# Patient Record
Sex: Female | Born: 1950 | Race: Black or African American | Hispanic: No | Marital: Married | Smoking: Former smoker
Health system: Southern US, Community
[De-identification: ages and names within clinical notes are randomized; demographics above are authoritative.]

## PROBLEM LIST (undated history)

## (undated) DIAGNOSIS — I10 Essential (primary) hypertension: Secondary | ICD-10-CM

## (undated) DIAGNOSIS — M1990 Unspecified osteoarthritis, unspecified site: Secondary | ICD-10-CM

## (undated) DIAGNOSIS — H409 Unspecified glaucoma: Secondary | ICD-10-CM

## (undated) DIAGNOSIS — K635 Polyp of colon: Secondary | ICD-10-CM

## (undated) HISTORY — DX: Polyp of colon: K63.5

## (undated) HISTORY — PX: KNEE ARTHROSCOPY: SUR90

## (undated) HISTORY — PX: ABDOMINAL HYSTERECTOMY: SHX81

---

## 1998-08-13 ENCOUNTER — Encounter: Payer: Self-pay | Admitting: Family Medicine

## 1998-08-13 ENCOUNTER — Ambulatory Visit (HOSPITAL_COMMUNITY): Admission: RE | Admit: 1998-08-13 | Discharge: 1998-08-13 | Payer: Self-pay | Admitting: Family Medicine

## 2000-06-15 ENCOUNTER — Other Ambulatory Visit: Admission: RE | Admit: 2000-06-15 | Discharge: 2000-06-15 | Payer: Self-pay | Admitting: Family Medicine

## 2002-10-02 ENCOUNTER — Encounter: Admission: RE | Admit: 2002-10-02 | Discharge: 2002-10-02 | Payer: Self-pay | Admitting: Family Medicine

## 2002-10-02 ENCOUNTER — Encounter: Payer: Self-pay | Admitting: Family Medicine

## 2002-10-17 ENCOUNTER — Encounter: Payer: Self-pay | Admitting: Orthopedic Surgery

## 2002-10-17 ENCOUNTER — Ambulatory Visit (HOSPITAL_COMMUNITY): Admission: RE | Admit: 2002-10-17 | Discharge: 2002-10-17 | Payer: Self-pay | Admitting: Orthopedic Surgery

## 2002-11-01 ENCOUNTER — Encounter: Payer: Self-pay | Admitting: Orthopedic Surgery

## 2002-11-06 ENCOUNTER — Ambulatory Visit (HOSPITAL_COMMUNITY): Admission: RE | Admit: 2002-11-06 | Discharge: 2002-11-06 | Payer: Self-pay | Admitting: Orthopedic Surgery

## 2002-11-24 ENCOUNTER — Encounter (HOSPITAL_COMMUNITY): Admission: RE | Admit: 2002-11-24 | Discharge: 2002-12-24 | Payer: Self-pay | Admitting: Orthopedic Surgery

## 2012-04-06 ENCOUNTER — Other Ambulatory Visit (HOSPITAL_COMMUNITY): Payer: Self-pay | Admitting: Family Medicine

## 2012-04-06 DIAGNOSIS — Z139 Encounter for screening, unspecified: Secondary | ICD-10-CM

## 2012-04-18 ENCOUNTER — Ambulatory Visit (HOSPITAL_COMMUNITY)
Admission: RE | Admit: 2012-04-18 | Discharge: 2012-04-18 | Disposition: A | Discharge status: Home / Self Care | Payer: BC Managed Care – PPO | Source: Ambulatory Visit | Attending: Family Medicine | Admitting: Family Medicine

## 2012-04-18 DIAGNOSIS — Z139 Encounter for screening, unspecified: Secondary | ICD-10-CM

## 2012-04-18 DIAGNOSIS — Z1231 Encounter for screening mammogram for malignant neoplasm of breast: Secondary | ICD-10-CM | POA: Insufficient documentation

## 2012-05-10 ENCOUNTER — Ambulatory Visit (HOSPITAL_COMMUNITY)
Admission: RE | Admit: 2012-05-10 | Discharge: 2012-05-10 | Disposition: A | Discharge status: Home / Self Care | Payer: BC Managed Care – PPO | Source: Ambulatory Visit | Attending: Family Medicine | Admitting: Family Medicine

## 2012-05-10 ENCOUNTER — Other Ambulatory Visit (HOSPITAL_COMMUNITY): Payer: Self-pay | Admitting: Family Medicine

## 2012-05-10 DIAGNOSIS — M1250 Traumatic arthropathy, unspecified site: Secondary | ICD-10-CM | POA: Insufficient documentation

## 2012-07-18 ENCOUNTER — Telehealth: Payer: Self-pay

## 2012-07-18 ENCOUNTER — Other Ambulatory Visit: Payer: Self-pay

## 2012-07-18 DIAGNOSIS — Z1211 Encounter for screening for malignant neoplasm of colon: Secondary | ICD-10-CM

## 2012-07-18 NOTE — Telephone Encounter (Signed)
Gastroenterology Pre-Procedure Form    Request Date: 07/18/2012    ,  Requesting Physician: Dr. Parke Simmers     PATIENT INFORMATION:  Christine Poole is a 62 y.o., female (DOB=03-Jul-1950).  PROCEDURE: Procedure(s) requested: colonoscopy Procedure Reason: screening for colon cancer  PATIENT REVIEW QUESTIONS: The patient reports the following:   1. Diabetes Melitis: no 2. Joint replacements in the past 12 months: no 3. Major health problems in the past 3 months: no 4. Has an artificial valve or MVP:no 5. Has been advised in past to take antibiotics in advance of a procedure like teeth cleaning: no}    MEDICATIONS & ALLERGIES:    Patient reports the following regarding taking any blood thinners:   Plavix? no Aspirin?no Coumadin?  no  Patient confirms/reports the following medications:  Current Outpatient Prescriptions  Medication Sig Dispense Refill  . aspirin 81 MG tablet Take 81 mg by mouth daily.      Marland Kitchen diltiazem (CARDIZEM) 90 MG tablet Take 90 mg by mouth 2 (two) times daily.      Marland Kitchen lisinopril-hydrochlorothiazide (PRINZIDE,ZESTORETIC) 20-25 MG per tablet Take 1 tablet by mouth daily.      . Multiple Vitamin (MULTIVITAMIN) tablet Take 1 tablet by mouth daily.      . naproxen (NAPROSYN) 500 MG tablet Take 500 mg by mouth 2 (two) times daily with a meal.       No current facility-administered medications for this visit.    Patient confirms/reports the following allergies:  No Known Allergies  Patient is appropriate to schedule for requested procedure(s): yes  AUTHORIZATION INFORMATION Primary Insurance:   ID #:   Group #:  Pre-Cert / Auth required:  Pre-Cert / Auth #:   Secondary Insurance:  ID #:  Group #:  Pre-Cert / Auth required: Pre-Cert / Auth #:   No orders of the defined types were placed in this encounter.    SCHEDULE INFORMATION: Procedure has been scheduled as follows:  Date: 08/08/2012     Time: 7:30 AM  Location: [facility] Short Stay  This  Gastroenterology Pre-Precedure Form is being routed to the following provider(s) for review: R. Roetta Sessions, MD

## 2012-07-18 NOTE — Telephone Encounter (Signed)
Appropriate.

## 2012-07-19 MED ORDER — PEG-KCL-NACL-NASULF-NA ASC-C 100 G PO SOLR: 1.0000 | ORAL | Status: DC

## 2012-07-19 NOTE — Telephone Encounter (Signed)
Rx was sent to [facility]. Instructions mailed to pt.

## 2012-07-19 NOTE — Telephone Encounter (Signed)
LMOM at [phone] that her appt was changed from 9:30 AM appt to 7:30 AM on 08/08/2012 and she will need to be at the hospital at 6:30 AM. Mailed the info to her also.

## 2012-07-29 ENCOUNTER — Encounter (HOSPITAL_COMMUNITY): Payer: Self-pay | Admitting: Pharmacy Technician

## 2012-08-05 MED ORDER — SODIUM CHLORIDE 0.45 % IV SOLN: INTRAVENOUS | Status: DC

## 2012-08-08 ENCOUNTER — Ambulatory Visit (HOSPITAL_COMMUNITY)
Admission: RE | Admit: 2012-08-08 | Discharge: 2012-08-08 | Disposition: A | Discharge status: Home / Self Care | Payer: BC Managed Care – PPO | Source: Ambulatory Visit | Attending: Internal Medicine | Admitting: Internal Medicine

## 2012-08-08 ENCOUNTER — Encounter (HOSPITAL_COMMUNITY): Payer: Self-pay

## 2012-08-08 ENCOUNTER — Encounter (HOSPITAL_COMMUNITY)
Admission: RE | Disposition: A | Discharge status: Home / Self Care | Payer: Self-pay | Source: Ambulatory Visit | Attending: Internal Medicine

## 2012-08-08 DIAGNOSIS — K648 Other hemorrhoids: Secondary | ICD-10-CM

## 2012-08-08 DIAGNOSIS — I10 Essential (primary) hypertension: Secondary | ICD-10-CM | POA: Insufficient documentation

## 2012-08-08 DIAGNOSIS — K620 Anal polyp: Secondary | ICD-10-CM

## 2012-08-08 DIAGNOSIS — Z1211 Encounter for screening for malignant neoplasm of colon: Secondary | ICD-10-CM | POA: Insufficient documentation

## 2012-08-08 DIAGNOSIS — D128 Benign neoplasm of rectum: Secondary | ICD-10-CM | POA: Insufficient documentation

## 2012-08-08 DIAGNOSIS — K621 Rectal polyp: Secondary | ICD-10-CM

## 2012-08-08 DIAGNOSIS — Z538 Procedure and treatment not carried out for other reasons: Secondary | ICD-10-CM | POA: Insufficient documentation

## 2012-08-08 HISTORY — DX: Essential (primary) hypertension: I10

## 2012-08-08 HISTORY — DX: Unspecified osteoarthritis, unspecified site: M19.90

## 2012-08-08 HISTORY — DX: Unspecified glaucoma: H40.9

## 2012-08-08 HISTORY — PX: COLONOSCOPY: SHX5424

## 2012-08-08 SURGERY — COLONOSCOPY
Anesthesia: Moderate Sedation

## 2012-08-08 MED ORDER — STERILE WATER FOR IRRIGATION IR SOLN
Status: DC | PRN
  Administered 2012-08-08: 07:00:00

## 2012-08-08 MED ORDER — MIDAZOLAM HCL 5 MG/5ML IJ SOLN
INTRAMUSCULAR | Status: DC | PRN
  Administered 2012-08-08: 2 mg via INTRAVENOUS
  Administered 2012-08-08: 1 mg via INTRAVENOUS

## 2012-08-08 MED ORDER — MEPERIDINE HCL 100 MG/ML IJ SOLN
INTRAMUSCULAR | Status: DC | PRN
  Administered 2012-08-08: 50 mg via INTRAVENOUS
  Administered 2012-08-08: 25 mg via INTRAVENOUS

## 2012-08-08 MED ORDER — SODIUM CHLORIDE 0.9 % IV SOLN
INTRAVENOUS | Status: DC
  Administered 2012-08-08: 07:00:00 via INTRAVENOUS

## 2012-08-08 MED ORDER — MEPERIDINE HCL 100 MG/ML IJ SOLN
INTRAMUSCULAR | Status: AC
  Filled 2012-08-08: qty 1

## 2012-08-08 MED ORDER — ONDANSETRON HCL 4 MG/2ML IJ SOLN
INTRAMUSCULAR | Status: DC | PRN
  Administered 2012-08-08: 4 mg via INTRAVENOUS

## 2012-08-08 MED ORDER — ONDANSETRON HCL 4 MG/2ML IJ SOLN
INTRAMUSCULAR | Status: AC
  Filled 2012-08-08: qty 2

## 2012-08-08 MED ORDER — MIDAZOLAM HCL 5 MG/5ML IJ SOLN
INTRAMUSCULAR | Status: AC
  Filled 2012-08-08: qty 10

## 2012-08-08 NOTE — Op Note (Signed)
[facility] [address] [place], [address]   COLONOSCOPY PROCEDURE REPORT  PATIENT: Christine Poole, Christine Poole  MR#:         [id number] BIRTHDATE: August 30, 1950 , 61  yrs. old GENDER: Female ENDOSCOPIST: R.  Roetta Sessions, MD FACP FACG REFERRED BY:  Renaye Rakers, M.D. PROCEDURE DATE:  08/08/2012 PROCEDURE:     Incomplete ileocolonoscopy with biopsy  INDICATIONS: First-ever average risk colorectal cancer screening examination  INFORMED CONSENT:  The risks, benefits, alternatives and imponderables including but not limited to bleeding, perforation as well as the possibility of a missed lesion have been reviewed.  The potential for biopsy, lesion removal, etc. have also been discussed.  Questions have been answered.  All parties agreeable. Please see the history and physical in the medical record for more information.  MEDICATIONS: Versed 4 mg IV and Demerol 75 mg IV in divided doses. Zofran 4 mg IV  DESCRIPTION OF PROCEDURE:  After a digital rectal exam was performed, the EC-3890Li ([id number])  colonoscope was advanced from the anus through the rectum and colon to the area of the cecum, ileocecal valve and appendiceal orifice.  The cecum was deeply intubated.  These structures were well-seen and photographed for the record.  From the level of the cecum and ileocecal valve, the scope was slowly and cautiously withdrawn.  The mucosal surfaces were carefully surveyed utilizing scope tip deflection to facilitate fold flattening as needed.  The scope was pulled down into the rectum where a thorough examination including retroflexion was performed.    FINDINGS:  Inadequate preparation . Much stool/vegetable fiber debris in the lumen of the colon precluded complete examination ( patient states she ate "greens" and corn yesterday which would have been in conflict with her written and verbal prep instructions). The portions of the colon that were seen appeared normal. The  cecum was not seen at all. The distal 10 cm of terminal ileum was intubated. Again, the portions of the terminal ileal mucosa seen appeared normal.  There were (2) diminutive distal rectal polyps and internal hemorrhoids. THERAPEUTIC / DIAGNOSTIC MANEUVERS PERFORMED:  The rectal polyps were cold biopsied/removed  COMPLICATIONS: none  CECAL WITHDRAWAL TIME:  10 minutes  IMPRESSION: Inadequate ileocolonoscopy secondary to poor prep. Rectal polyps and internal hemorrhoids. Status post removal of rectal polyps as described above  RECOMMENDATIONS:  Followup on pathology. The patient will need an early interval repeat screening colonoscopy, hopefully, in the setting of a better preparation.   _______________________________ eSigned:  R. Roetta Sessions, MD FACP [facility] 08/08/2012 8:26 AM   CC:

## 2012-08-08 NOTE — H&P (Signed)
Primary Care Physician:  Geraldo Pitter, MD Primary Gastroenterologist:  Dr. Jena Gauss  Pre-Procedure History & Physical: HPI:  Christine Poole is a 62 y.o. female is here for a screening colonoscopy. No bowel symptoms. No prior colonoscopy. No family history colon polyps or colon cancer.  Past Medical History  Diagnosis Date  . Glaucoma   . Hypertension   . Arthritis     Past Surgical History  Procedure Laterality Date  . Abdominal hysterectomy    . Knee arthroscopy Left     Prior to Admission medications   Medication Sig Start Date End Date Taking? Authorizing Provider  aspirin 81 MG tablet Take 81 mg by mouth daily.   Yes Historical Provider, MD  diltiazem (CARDIZEM) 90 MG tablet Take 90 mg by mouth 2 (two) times daily.   Yes Historical Provider, MD  lisinopril-hydrochlorothiazide (PRINZIDE,ZESTORETIC) 20-25 MG per tablet Take 1 tablet by mouth daily.   Yes Historical Provider, MD  Multiple Vitamin (MULTIVITAMIN) tablet Take 1 tablet by mouth daily.   Yes Historical Provider, MD  naproxen (NAPROSYN) 500 MG tablet Take 500 mg by mouth 2 (two) times daily with a meal.   Yes Historical Provider, MD  peg 3350 powder (MOVIPREP) 100 G SOLR Take 1 kit (100 g total) by mouth as directed. 07/19/12  Yes Corbin Ade, MD    Allergies as of 07/18/2012  . (No Known Allergies)    History reviewed. No pertinent family history.  History   Social History  . Marital Status: Married    Spouse Name: N/A    Number of Children: N/A  . Years of Education: N/A   Occupational History  . Not on file.   Social History Main Topics  . Smoking status: Current Every Day Smoker -- 0.25 packs/day for 40 years    Types: Cigarettes  . Smokeless tobacco: Not on file  . Alcohol Use: No  . Drug Use: No  . Sexually Active: Yes    Birth Control/ Protection: Surgical   Other Topics Concern  . Not on file   Social History Narrative  . No narrative on file    Review of Systems: See HPI,  otherwise negative ROS  Physical Exam: BP 170/100  Pulse 89  Temp(Src) 98.1 F (36.7 C) (Oral)  Resp 15  Ht 5\' 6"  (1.676 m)  Wt 175 lb (79.379 kg)  BMI 28.26 kg/m2  SpO2 97% General:   Alert,  Well-developed, well-nourished, pleasant and cooperative in NAD Head:  Normocephalic and atraumatic. Eyes:  Sclera clear, no icterus.   Conjunctiva pink. Ears:  Normal auditory acuity. Nose:  No deformity, discharge,  or lesions. Mouth:  No deformity or lesions, dentition normal. Neck:  Supple; no masses or thyromegaly. Lungs:  Clear throughout to auscultation.   No wheezes, crackles, or rhonchi. No acute distress. Heart:  Regular rate and rhythm; no murmurs, clicks, rubs,  or gallops. Abdomen:  Nondistended. Positive bowel sounds. Soft and nontender without appreciable mass or organomegaly Msk:  Symmetrical without gross deformities. Normal posture. Pulses:  Normal pulses noted. Extremities:  Without clubbing or edema. Neurologic:  Alert and  oriented x4;  grossly normal neurologically. Skin:  Intact without significant lesions or rashes. Cervical Nodes:  No significant cervical adenopathy. Psych:  Alert and cooperative. Normal mood and affect.  Impression/Plan: [employer] is now here to undergo a screening colonoscopy.  First ever average risk screening examination.  Risks, benefits, limitations, imponderables and alternatives regarding colonoscopy have been reviewed with the patient.  Questions have been answered. All parties agreeable.

## 2012-08-10 ENCOUNTER — Encounter (HOSPITAL_COMMUNITY): Payer: Self-pay | Admitting: Internal Medicine

## 2012-08-11 ENCOUNTER — Encounter: Payer: Self-pay | Admitting: Internal Medicine

## 2012-11-24 ENCOUNTER — Other Ambulatory Visit (HOSPITAL_COMMUNITY): Payer: Self-pay | Admitting: Family Medicine

## 2012-11-24 DIAGNOSIS — E041 Nontoxic single thyroid nodule: Secondary | ICD-10-CM

## 2012-11-29 ENCOUNTER — Ambulatory Visit (HOSPITAL_COMMUNITY)
Admission: RE | Admit: 2012-11-29 | Discharge: 2012-11-29 | Disposition: A | Discharge status: Home / Self Care | Payer: BC Managed Care – PPO | Source: Ambulatory Visit | Attending: Family Medicine | Admitting: Family Medicine

## 2012-11-29 DIAGNOSIS — E041 Nontoxic single thyroid nodule: Secondary | ICD-10-CM | POA: Insufficient documentation

## 2013-03-20 ENCOUNTER — Other Ambulatory Visit (HOSPITAL_COMMUNITY): Payer: Self-pay | Admitting: Family Medicine

## 2013-03-20 DIAGNOSIS — R221 Localized swelling, mass and lump, neck: Secondary | ICD-10-CM

## 2013-03-27 ENCOUNTER — Other Ambulatory Visit (HOSPITAL_COMMUNITY): Payer: BC Managed Care – PPO

## 2013-03-27 ENCOUNTER — Ambulatory Visit (HOSPITAL_COMMUNITY): Payer: BC Managed Care – PPO | Attending: Family Medicine

## 2013-04-17 ENCOUNTER — Other Ambulatory Visit (HOSPITAL_COMMUNITY): Payer: Self-pay | Admitting: Family Medicine

## 2013-04-17 DIAGNOSIS — Z139 Encounter for screening, unspecified: Secondary | ICD-10-CM

## 2013-04-24 ENCOUNTER — Ambulatory Visit (HOSPITAL_COMMUNITY)
Admission: RE | Admit: 2013-04-24 | Discharge: 2013-04-24 | Disposition: A | Discharge status: Home / Self Care | Payer: BC Managed Care – PPO | Source: Ambulatory Visit | Attending: Family Medicine | Admitting: Family Medicine

## 2013-04-24 DIAGNOSIS — Z139 Encounter for screening, unspecified: Secondary | ICD-10-CM

## 2013-04-24 DIAGNOSIS — Z1231 Encounter for screening mammogram for malignant neoplasm of breast: Secondary | ICD-10-CM | POA: Insufficient documentation

## 2014-02-26 ENCOUNTER — Encounter: Payer: Self-pay | Admitting: Internal Medicine

## 2014-02-28 ENCOUNTER — Telehealth: Payer: Self-pay

## 2014-02-28 NOTE — Telephone Encounter (Signed)
Lorne Skeens from [facility] called and left voicemail. They are wanting to know when pt is scheduled to have her next tcs. I have tried to call Judeen Hammans back several times and keep being put on hold and no one answers. I have faxed a letter to them, letting them know that pt has upcoming appt on 03/28/14 with RMR to set up procedure.

## 2014-03-22 ENCOUNTER — Encounter: Payer: Self-pay | Admitting: *Deleted

## 2014-03-28 ENCOUNTER — Other Ambulatory Visit: Payer: Self-pay

## 2014-03-28 ENCOUNTER — Encounter: Payer: Self-pay | Admitting: Internal Medicine

## 2014-03-28 ENCOUNTER — Ambulatory Visit (INDEPENDENT_AMBULATORY_CARE_PROVIDER_SITE_OTHER): Payer: BC Managed Care – PPO | Admitting: Internal Medicine

## 2014-03-28 VITALS — BP 119/83 | HR 97 | Temp 97.1°F | Ht 66.0 in | Wt 185.0 lb

## 2014-03-28 DIAGNOSIS — Z1212 Encounter for screening for malignant neoplasm of rectum: Secondary | ICD-10-CM

## 2014-03-28 DIAGNOSIS — Z1211 Encounter for screening for malignant neoplasm of colon: Secondary | ICD-10-CM

## 2014-03-28 MED ORDER — PEG 3350-KCL-NA BICARB-NACL 420 G PO SOLR: 4000.0000 mL | Freq: Once | ORAL | Status: DC

## 2014-03-28 NOTE — Patient Instructions (Signed)
Schedule screening colonoscopy-preference for a Monday.  Split Movie prep

## 2014-03-28 NOTE — Progress Notes (Signed)
  Primary Care Physician:  BLAND,VEITA J, MD Primary Gastroenterologist:  Dr. Jaila Schellhorn  Pre-Procedure History & Physical: HPI:  Christine Poole is a 63 y.o. female here for for followup and discuss last years in adequate colonoscopy. Patient underwent her first ever average risk screening colonoscopy last year. Unfortunately, she "greens and colon" which produced a totally inadequate preparation. She did have some hyperplastic in the rectum which were removed. She returns now to discuss repeat colonoscopy. Has had no bowel symptoms. No family history of colon cancer. No significant interim health problems.  Past Medical History  Diagnosis Date  . Glaucoma   . Hypertension   . Arthritis   . Hyperplastic polyp of large intestine     Past Surgical History  Procedure Laterality Date  . Abdominal hysterectomy    . Knee arthroscopy Left   . Colonoscopy N/A 08/08/2012    RMR:Rectal polyps and internal hemorrhoids.Status post removal rectal polyps    Prior to Admission medications   Medication Sig Start Date End Date Taking? Authorizing Provider  aspirin 81 MG tablet Take 81 mg by mouth daily.   Yes Historical Provider, MD  diltiazem (CARDIZEM) 90 MG tablet Take 90 mg by mouth 2 (two) times daily.   Yes Historical Provider, MD  lisinopril-hydrochlorothiazide (PRINZIDE,ZESTORETIC) 20-25 MG per tablet Take 1 tablet by mouth daily.   Yes Historical Provider, MD  Multiple Vitamin (MULTIVITAMIN) tablet Take 1 tablet by mouth daily.   Yes Historical Provider, MD  naproxen (NAPROSYN) 500 MG tablet Take 500 mg by mouth 2 (two) times daily with a meal.   Yes Historical Provider, MD  peg 3350 powder (MOVIPREP) 100 G SOLR Take 1 kit (100 g total) by mouth as directed. 07/19/12   Hetal Proano M Minerva Bluett, MD    Allergies as of 03/28/2014  . (No Known Allergies)    No family history on file.  History   Social History  . Marital Status: Married    Spouse Name: N/A    Number of Children: N/A  . Years  of Education: N/A   Occupational History  . Not on file.   Social History Main Topics  . Smoking status: Current Every Day Smoker -- 0.25 packs/day for 40 years    Types: Cigarettes  . Smokeless tobacco: Not on file  . Alcohol Use: No  . Drug Use: No  . Sexual Activity: Yes    Birth Control/ Protection: Surgical   Other Topics Concern  . Not on file   Social History Narrative    Review of Systems: See HPI, otherwise negative ROS  Physical Exam: BP 119/83 mmHg  Pulse 97  Temp(Src) 97.1 F (36.2 C) (Oral)  Ht 5' 6" (1.676 m)  Wt 185 lb (83.915 kg)  BMI 29.87 kg/m2 General:   Alert,  Well-developed, well-nourished, pleasant and cooperative in NAD Skin:  Intact without significant lesions or rashes. Eyes:  Sclera clear, no icterus.   Conjunctiva pink. Ears:  Normal auditory acuity. Nose:  No deformity, discharge,  or lesions. Mouth:  No deformity or lesions. Neck:  Supple; no masses or thyromegaly. No significant cervical adenopathy. Lungs:  Clear throughout to auscultation.   No wheezes, crackles, or rhonchi. No acute distress. Heart:  Regular rate and rhythm; no murmurs, clicks, rubs,  or gallops. Abdomen: Non-distended, normal bowel sounds.  Soft and nontender without appreciable mass or hepatosplenomegaly.  Pulses:  Normal pulses noted. Extremities:  Without clubbing or edema.  Impression:  Very pleasant 63-year-old lady presents for consideration   of a screening colonoscopy. Discussed the critical importance of an adequate preparation. Last years exam was inadequate.   Recommendations:  Notice: This dictation was prepared with Dragon dictation along with smaller phrase technology. Any transcriptional errors that result from this process are unintentional and may not be corrected upon review. 

## 2014-04-09 ENCOUNTER — Telehealth: Payer: Self-pay | Admitting: Internal Medicine

## 2014-04-09 NOTE — Telephone Encounter (Signed)
Patient called this morning saying that she has a tcs set up for 12/7 with RMR and wanted to make sure we have called in her prep to [facility] in [place] and she will not forget to pick it up this time.

## 2014-04-10 NOTE — Telephone Encounter (Signed)
Prep is at the drug store and patient is aware

## 2014-04-16 ENCOUNTER — Ambulatory Visit (HOSPITAL_COMMUNITY)
Admission: RE | Admit: 2014-04-16 | Discharge: 2014-04-16 | Disposition: A | Discharge status: Home / Self Care | Payer: BC Managed Care – PPO | Source: Ambulatory Visit | Attending: Internal Medicine | Admitting: Internal Medicine

## 2014-04-16 ENCOUNTER — Encounter (HOSPITAL_COMMUNITY): Payer: Self-pay

## 2014-04-16 ENCOUNTER — Encounter (HOSPITAL_COMMUNITY)
Admission: RE | Disposition: A | Discharge status: Home / Self Care | Payer: Self-pay | Source: Ambulatory Visit | Attending: Internal Medicine

## 2014-04-16 DIAGNOSIS — Z1211 Encounter for screening for malignant neoplasm of colon: Secondary | ICD-10-CM | POA: Insufficient documentation

## 2014-04-16 DIAGNOSIS — M1990 Unspecified osteoarthritis, unspecified site: Secondary | ICD-10-CM | POA: Insufficient documentation

## 2014-04-16 DIAGNOSIS — I10 Essential (primary) hypertension: Secondary | ICD-10-CM | POA: Diagnosis not present

## 2014-04-16 DIAGNOSIS — Z7982 Long term (current) use of aspirin: Secondary | ICD-10-CM | POA: Insufficient documentation

## 2014-04-16 DIAGNOSIS — K648 Other hemorrhoids: Secondary | ICD-10-CM | POA: Insufficient documentation

## 2014-04-16 DIAGNOSIS — F17210 Nicotine dependence, cigarettes, uncomplicated: Secondary | ICD-10-CM | POA: Diagnosis not present

## 2014-04-16 DIAGNOSIS — Z791 Long term (current) use of non-steroidal anti-inflammatories (NSAID): Secondary | ICD-10-CM | POA: Insufficient documentation

## 2014-04-16 DIAGNOSIS — Z9889 Other specified postprocedural states: Secondary | ICD-10-CM | POA: Insufficient documentation

## 2014-04-16 HISTORY — PX: COLONOSCOPY: SHX5424

## 2014-04-16 SURGERY — COLONOSCOPY
Anesthesia: Moderate Sedation

## 2014-04-16 MED ORDER — STERILE WATER FOR IRRIGATION IR SOLN
Status: DC | PRN
  Administered 2014-04-16: 13:00:00

## 2014-04-16 MED ORDER — ONDANSETRON HCL 4 MG/2ML IJ SOLN
INTRAMUSCULAR | Status: DC | PRN
  Administered 2014-04-16: 4 mg via INTRAVENOUS

## 2014-04-16 MED ORDER — SODIUM CHLORIDE 0.9 % IV SOLN
INTRAVENOUS | Status: DC
  Administered 2014-04-16: 12:00:00 via INTRAVENOUS

## 2014-04-16 MED ORDER — MIDAZOLAM HCL 5 MG/5ML IJ SOLN
INTRAMUSCULAR | Status: AC
  Filled 2014-04-16: qty 10

## 2014-04-16 MED ORDER — ONDANSETRON HCL 4 MG/2ML IJ SOLN
INTRAMUSCULAR | Status: AC
  Filled 2014-04-16: qty 2

## 2014-04-16 MED ORDER — MEPERIDINE HCL 100 MG/ML IJ SOLN
INTRAMUSCULAR | Status: DC | PRN
  Administered 2014-04-16: 25 mg via INTRAVENOUS
  Administered 2014-04-16: 50 mg via INTRAVENOUS
  Administered 2014-04-16: 25 mg via INTRAVENOUS

## 2014-04-16 MED ORDER — MIDAZOLAM HCL 5 MG/5ML IJ SOLN
INTRAMUSCULAR | Status: DC | PRN
  Administered 2014-04-16: 2 mg via INTRAVENOUS
  Administered 2014-04-16: 1 mg via INTRAVENOUS
  Administered 2014-04-16: 2 mg via INTRAVENOUS
  Administered 2014-04-16: 1 mg via INTRAVENOUS

## 2014-04-16 MED ORDER — MEPERIDINE HCL 100 MG/ML IJ SOLN
INTRAMUSCULAR | Status: AC
  Filled 2014-04-16: qty 2

## 2014-04-16 NOTE — H&P (View-Only) (Signed)
Primary Care Physician:  Elyn Peers, MD Primary Gastroenterologist:  Dr. Gala Romney  Pre-Procedure History & Physical: HPI:  Christine Poole is a 63 y.o. female here for for followup and discuss last years in adequate colonoscopy. Patient underwent her first ever average risk screening colonoscopy last year. Unfortunately, she "greens and colon" which produced a totally inadequate preparation. She did have some hyperplastic in the rectum which were removed. She returns now to discuss repeat colonoscopy. Has had no bowel symptoms. No family history of colon cancer. No significant interim health problems.  Past Medical History  Diagnosis Date  . Glaucoma   . Hypertension   . Arthritis   . Hyperplastic polyp of large intestine     Past Surgical History  Procedure Laterality Date  . Abdominal hysterectomy    . Knee arthroscopy Left   . Colonoscopy N/A 08/08/2012    NTZ:GYFVCB polyps and internal hemorrhoids.Status post removal rectal polyps    Prior to Admission medications   Medication Sig Start Date End Date Taking? Authorizing Provider  aspirin 81 MG tablet Take 81 mg by mouth daily.   Yes Historical Provider, MD  diltiazem (CARDIZEM) 90 MG tablet Take 90 mg by mouth 2 (two) times daily.   Yes Historical Provider, MD  lisinopril-hydrochlorothiazide (PRINZIDE,ZESTORETIC) 20-25 MG per tablet Take 1 tablet by mouth daily.   Yes Historical Provider, MD  Multiple Vitamin (MULTIVITAMIN) tablet Take 1 tablet by mouth daily.   Yes Historical Provider, MD  naproxen (NAPROSYN) 500 MG tablet Take 500 mg by mouth 2 (two) times daily with a meal.   Yes Historical Provider, MD  peg 3350 powder (MOVIPREP) 100 G SOLR Take 1 kit (100 g total) by mouth as directed. 07/19/12   Daneil Dolin, MD    Allergies as of 03/28/2014  . (No Known Allergies)    No family history on file.  History   Social History  . Marital Status: Married    Spouse Name: N/A    Number of Children: N/A  . Years  of Education: N/A   Occupational History  . Not on file.   Social History Main Topics  . Smoking status: Current Every Day Smoker -- 0.25 packs/day for 40 years    Types: Cigarettes  . Smokeless tobacco: Not on file  . Alcohol Use: No  . Drug Use: No  . Sexual Activity: Yes    Birth Control/ Protection: Surgical   Other Topics Concern  . Not on file   Social History Narrative    Review of Systems: See HPI, otherwise negative ROS  Physical Exam: BP 119/83 mmHg  Pulse 97  Temp(Src) 97.1 F (36.2 C) (Oral)  Ht _0  (1.676 m)  Wt 185 lb (83.915 kg)  BMI 29.87 kg/m2 General:   Alert,  Well-developed, well-nourished, pleasant and cooperative in NAD Skin:  Intact without significant lesions or rashes. Eyes:  Sclera clear, no icterus.   Conjunctiva pink. Ears:  Normal auditory acuity. Nose:  No deformity, discharge,  or lesions. Mouth:  No deformity or lesions. Neck:  Supple; no masses or thyromegaly. No significant cervical adenopathy. Lungs:  Clear throughout to auscultation.   No wheezes, crackles, or rhonchi. No acute distress. Heart:  Regular rate and rhythm; no murmurs, clicks, rubs,  or gallops. Abdomen: Non-distended, normal bowel sounds.  Soft and nontender without appreciable mass or hepatosplenomegaly.  Pulses:  Normal pulses noted. Extremities:  Without clubbing or edema.  Impression:  Very pleasant 63 year old lady presents for consideration  of a screening colonoscopy. Discussed the critical importance of an adequate preparation. Last years exam was inadequate.   Recommendations:  Notice: This dictation was prepared with Dragon dictation along with smaller phrase technology. Any transcriptional errors that result from this process are unintentional and may not be corrected upon review.

## 2014-04-16 NOTE — Op Note (Signed)
[facility] [address] [place], [address]   COLONOSCOPY PROCEDURE REPORT  PATIENT: Christine, Poole  MR#: [id number] BIRTHDATE: 1951-03-20 , 63  yrs. old GENDER: female ENDOSCOPIST: R.  Garfield Cornea, MD FACP [facility] REFERRED OE:HOZYY Criss Rosales, M.D. PROCEDURE DATE:  May 10, 2014 PROCEDURE:   Colonoscopy, screening INDICATIONS:Average risk colorectal cancer screening examination. MEDICATIONS: Versed 6 mg IV and Demerol 100 mg IV in divided doses.  ASA CLASS:       Class II  CONSENT: The risks, benefits, alternatives and imponderables including but not limited to bleeding, perforation as well as the possibility of a missed lesion have been reviewed.  The potential for biopsy, lesion removal, etc. have also been discussed. Questions have been answered.  All parties agreeable.  Please see the history and physical in the medical record for more information.  DESCRIPTION OF PROCEDURE:   After the risks benefits and alternatives of the procedure were thoroughly explained, informed consent was obtained.  The digital rectal exam revealed no abnormalities of the rectum.   The EC-3890Li ([id number])  endoscope was introduced through the anus and advanced to the cecum, which was identified by both the appendix and ileocecal valve. No adverse events experienced.   The quality of the prep was adequate.  The instrument was then slowly withdrawn as the colon was fully examined.      COLON FINDINGS: Internal hemorrhoids; otherwise normal rectum. Normal-appearing colonic mucosa.  Retroflexion was performed. .  Withdrawal time=8 minutes 0 seconds.  The scope was withdrawn and the procedure completed. COMPLICATIONS: There were no immediate complications.  ENDOSCOPIC IMPRESSION: Internal hemorrhoids; otherwise normal colonoscopy  RECOMMENDATIONS: Repeat screening examination in 10 years.  eSigned:  R. Garfield Cornea, MD [facility]al[facility] 2014/05/10 1:55 PM   cc:  CPT  CODES: ICD CODES:  The ICD and CPT codes recommended by this software are interpretations from the data that the clinical staff has captured with the software.  The verification of the translation of this report to the ICD and CPT codes and modifiers is the sole responsibility of the health care institution and practicing physician where this report was generated.  [employer] will not be held responsible for the validity of the ICD and CPT codes included on this report.  AMA assumes no liability for data contained or not contained herein. CPT is a Designer, television/film set of the [employer].

## 2014-04-16 NOTE — Discharge Instructions (Addendum)

## 2014-04-16 NOTE — Interval H&P Note (Signed)
History and Physical Interval Note:  04/16/2014 1:18 PM  Yznaga  has presented today for surgery, with the diagnosis of Screening colonoscopy  The various methods of treatment have been discussed with the patient and family. After consideration of risks, benefits and other options for treatment, the patient has consented to  Procedure(s) with comments: COLONOSCOPY (N/A) - 1:30pm as a surgical intervention .  The patient's history has been reviewed, patient examined, no change in status, stable for surgery.  I have reviewed the patient's chart and labs.  Questions were answered to the patient's satisfaction.     Alyah Boehning  No change. Patient states she was compliant with preparation. Colonoscopy today for screening per plan.The risks, benefits, limitations, alternatives and imponderables have been reviewed with the patient. Questions have been answered. All parties are agreeable.

## 2014-04-18 ENCOUNTER — Encounter (HOSPITAL_COMMUNITY): Payer: Self-pay | Admitting: Internal Medicine

## 2014-05-08 ENCOUNTER — Other Ambulatory Visit (HOSPITAL_COMMUNITY): Payer: Self-pay | Admitting: Family Medicine

## 2014-05-08 DIAGNOSIS — Z1231 Encounter for screening mammogram for malignant neoplasm of breast: Secondary | ICD-10-CM

## 2014-05-14 ENCOUNTER — Ambulatory Visit (HOSPITAL_COMMUNITY)
Admission: RE | Admit: 2014-05-14 | Discharge: 2014-05-14 | Disposition: A | Discharge status: Home / Self Care | Payer: BC Managed Care – PPO | Source: Ambulatory Visit | Attending: Family Medicine | Admitting: Family Medicine

## 2014-05-14 DIAGNOSIS — Z1231 Encounter for screening mammogram for malignant neoplasm of breast: Secondary | ICD-10-CM | POA: Diagnosis present

## 2015-03-26 ENCOUNTER — Other Ambulatory Visit (HOSPITAL_COMMUNITY): Payer: Self-pay | Admitting: Family Medicine

## 2015-03-26 DIAGNOSIS — Z1231 Encounter for screening mammogram for malignant neoplasm of breast: Secondary | ICD-10-CM

## 2015-05-20 ENCOUNTER — Ambulatory Visit (HOSPITAL_COMMUNITY)
Admission: RE | Admit: 2015-05-20 | Discharge: 2015-05-20 | Disposition: A | Discharge status: Home / Self Care | Payer: BLUE CROSS/BLUE SHIELD | Source: Ambulatory Visit | Attending: Family Medicine | Admitting: Family Medicine

## 2015-05-20 DIAGNOSIS — Z1231 Encounter for screening mammogram for malignant neoplasm of breast: Secondary | ICD-10-CM | POA: Diagnosis present

## 2015-06-18 IMAGING — MG MM DIGITAL SCREENING
4 series · 4 of 4 positions shown · non-contrast
Comparison: Previous exam(s).

CLINICAL DATA: Screening.

EXAM:
DIGITAL SCREENING BILATERAL MAMMOGRAM WITH CAD

[L CC]
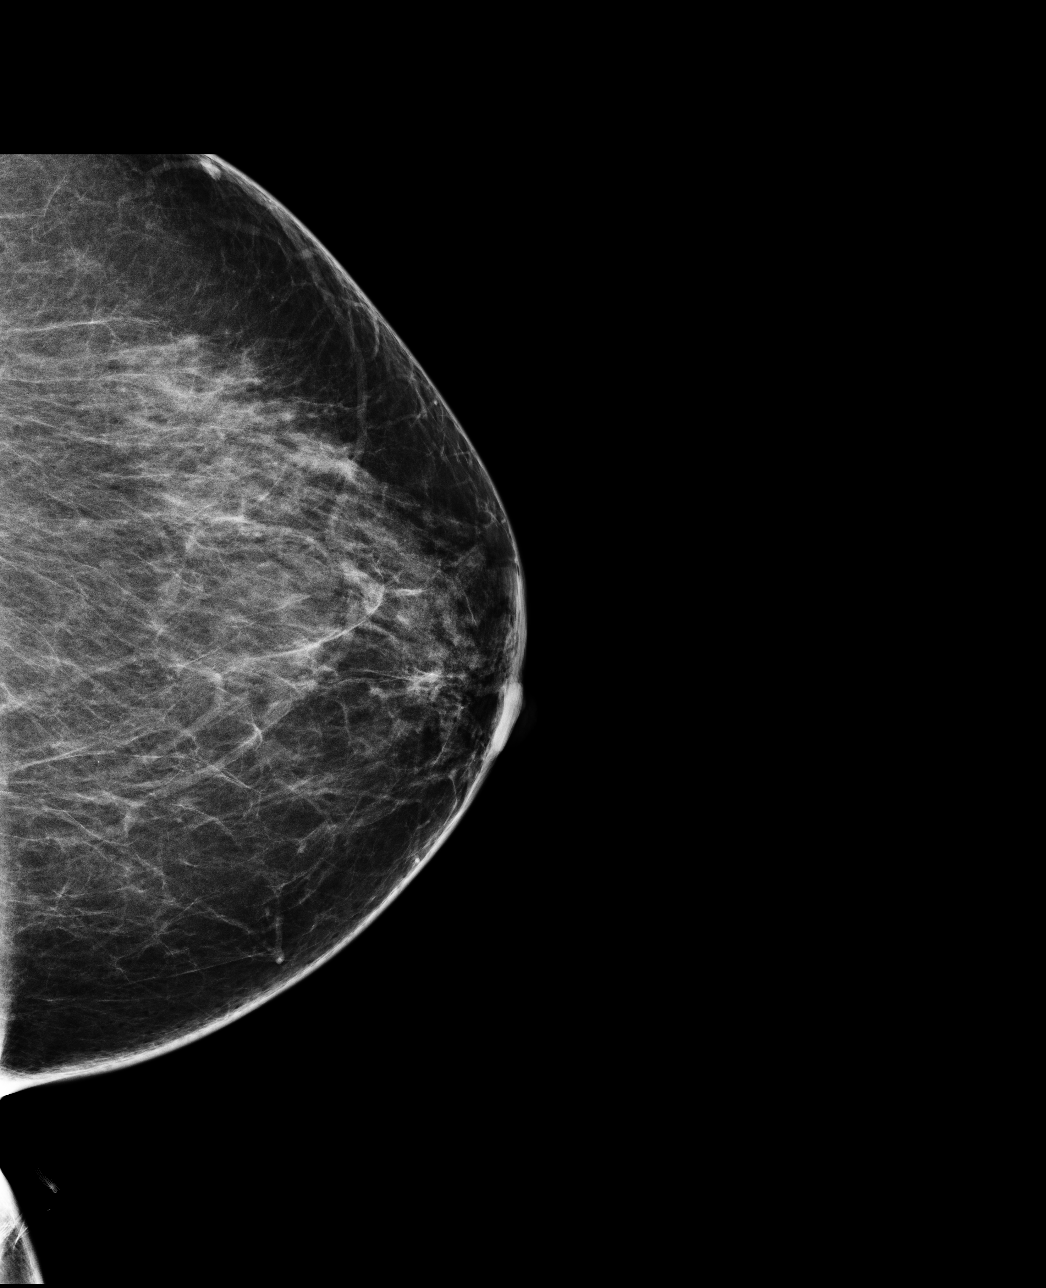

[L MLO]
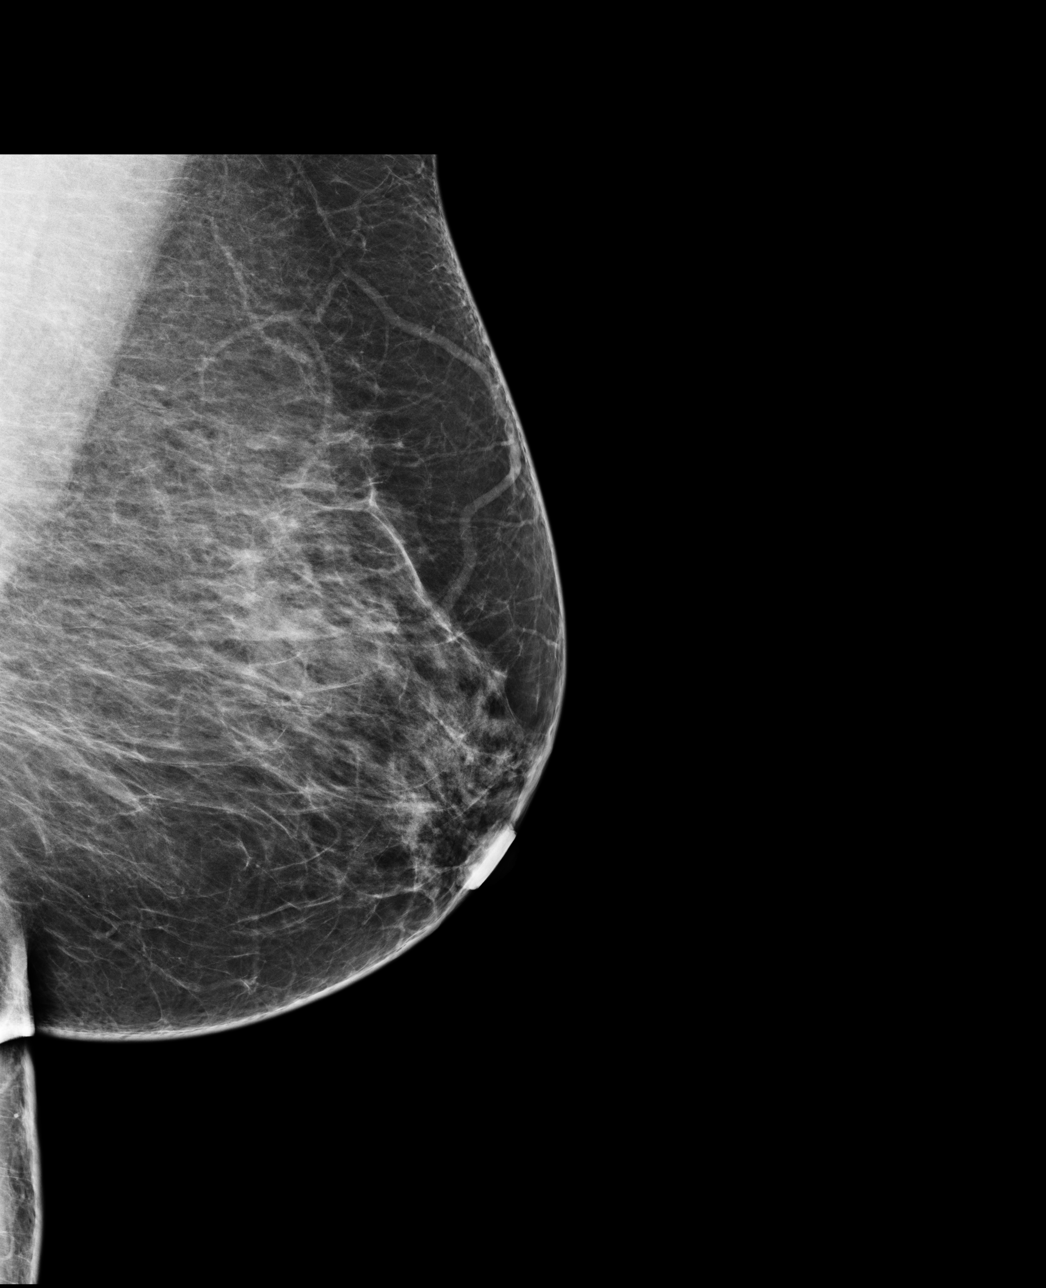

[R CC]
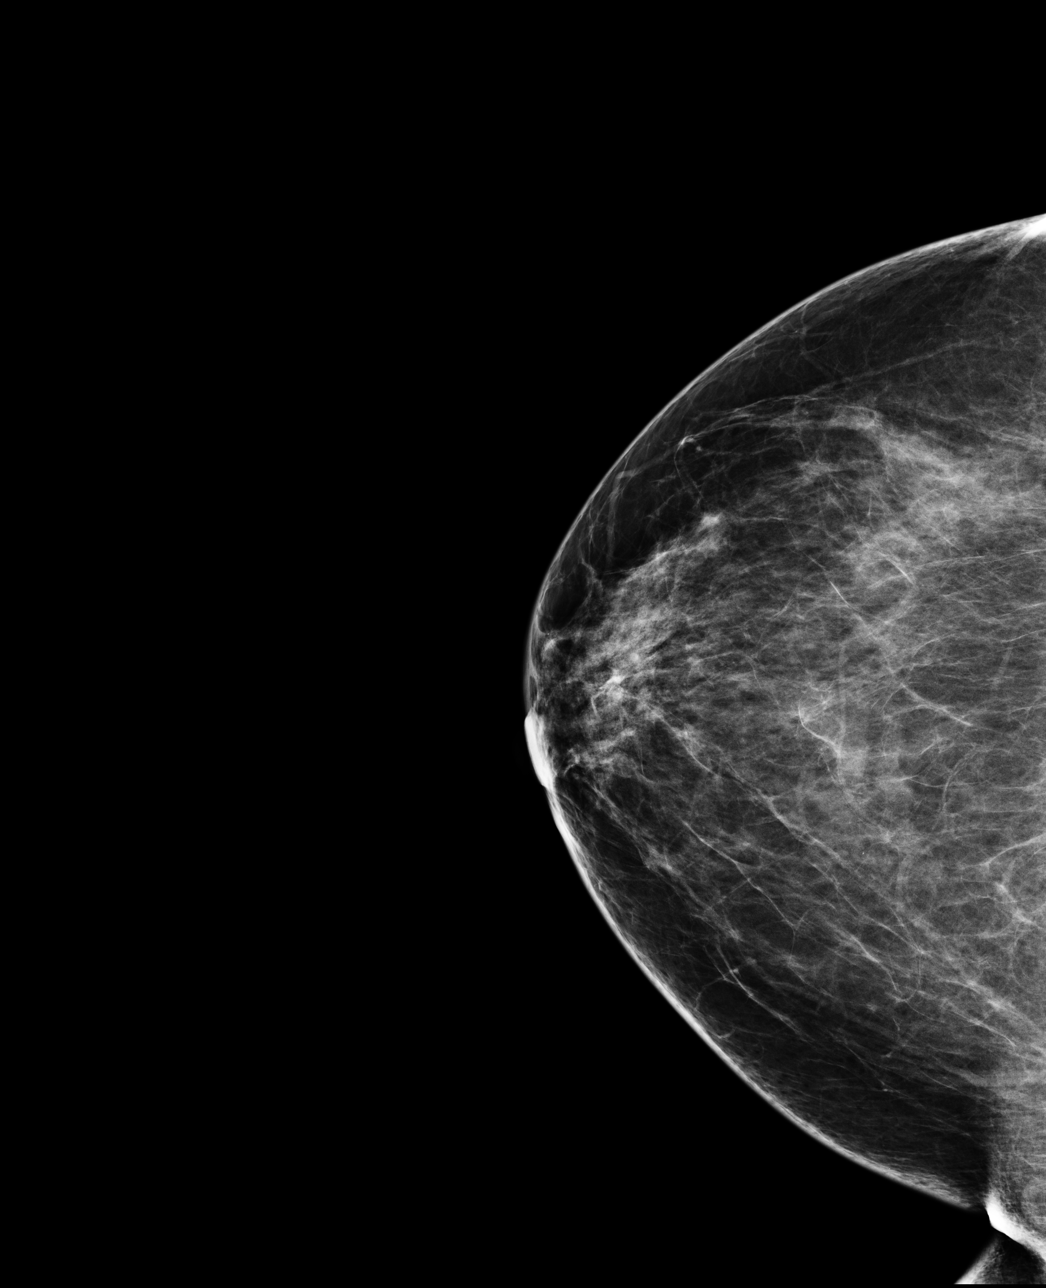

[R MLO]
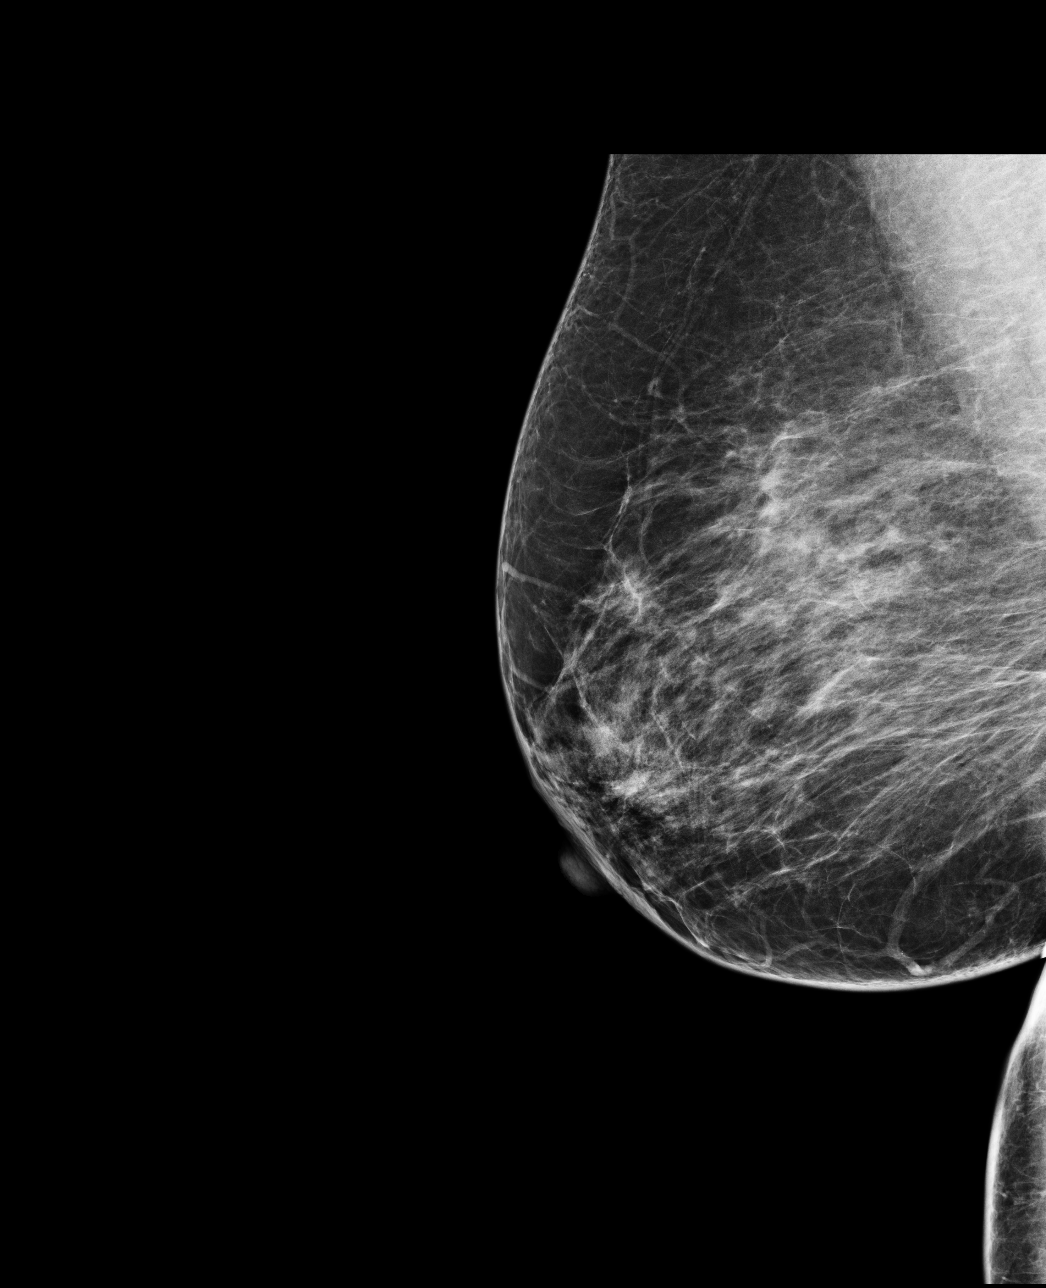

[4 of 4 positions shown; findings below may reference images not displayed]

ACR Breast Density Category b: There are scattered areas of
fibroglandular density.
FINDINGS: There are no findings suspicious for malignancy. Images were
processed with CAD.
IMPRESSION: No mammographic evidence of malignancy. A result letter of this
screening mammogram will be mailed directly to the patient.

RECOMMENDATION:
Screening mammogram in one year. (Code:AS-G-LCT)

BI-RADS CATEGORY  1: Negative.

## 2015-09-10 ENCOUNTER — Encounter: Payer: Self-pay | Admitting: Orthopaedic Surgery

## 2015-09-10 ENCOUNTER — Ambulatory Visit (INDEPENDENT_AMBULATORY_CARE_PROVIDER_SITE_OTHER): Payer: BLUE CROSS/BLUE SHIELD | Admitting: Orthopaedic Surgery

## 2015-09-10 ENCOUNTER — Ambulatory Visit (HOSPITAL_COMMUNITY)
Admission: RE | Admit: 2015-09-10 | Discharge: 2015-09-10 | Disposition: A | Discharge status: Home / Self Care | Payer: BLUE CROSS/BLUE SHIELD | Source: Ambulatory Visit | Attending: Orthopaedic Surgery | Admitting: Orthopaedic Surgery

## 2015-09-10 VITALS — BP 151/85 | HR 96 | Temp 97.9°F | Ht 66.0 in | Wt 181.8 lb

## 2015-09-10 DIAGNOSIS — M542 Cervicalgia: Secondary | ICD-10-CM | POA: Insufficient documentation

## 2015-09-10 DIAGNOSIS — M8580 Other specified disorders of bone density and structure, unspecified site: Secondary | ICD-10-CM | POA: Insufficient documentation

## 2015-09-10 NOTE — Progress Notes (Signed)
Subjective:    Patient ID: Christine Poole, female    DOB: February 17, 1951, 65 y.o.   MRN: [id number]  Neck Pain  This is a chronic problem. The current episode started more than 1 month ago. The problem occurs daily. The problem has been gradually worsening. The pain is associated with nothing. The pain is present in the midline. The quality of the pain is described as aching. The pain is at a severity of 3/10. The pain is mild. The symptoms are aggravated by bending and twisting. The pain is worse during the day. Pertinent negatives include no chest pain. She has tried acetaminophen, heat, ice and NSAIDs for the symptoms. The treatment provided mild relief.   She has had neck pain for about nine months.  She has seen Dr. Criss Rosales for this several times for the neck pain, the last time 04/23/15.  The patient called for an appointment here.  She has no numbness.  She has pain that is localized with no paresthesias.  She has tried Aleve and Tylenol with only slight help.   Review of Systems  HENT: Negative for congestion.   Respiratory: Negative for cough and shortness of breath.   Cardiovascular: Negative for chest pain and leg swelling.  Endocrine: Positive for cold intolerance.  Musculoskeletal: Positive for arthralgias and neck pain.  Allergic/Immunologic: Positive for environmental allergies.   Past Medical History  Diagnosis Date  . Glaucoma   . Hypertension   . Arthritis   . Hyperplastic polyp of large intestine     Past Surgical History  Procedure Laterality Date  . Abdominal hysterectomy    . Knee arthroscopy Left   . Colonoscopy N/A 08/08/2012    [id number] polyps and internal hemorrhoids.Status post removal rectal polyps  . Colonoscopy N/A 04/16/2014    Procedure: COLONOSCOPY;  Surgeon: Daneil Dolin, MD;  Location: [facility];  Service: Endoscopy;  Laterality: N/A;  1:30pm    Current Outpatient Prescriptions on File Prior to Visit  Medication Sig Dispense Refill  .  aspirin 81 MG tablet Take 81 mg by mouth daily.    Marland Kitchen diltiazem (CARDIZEM) 90 MG tablet Take 90 mg by mouth 2 (two) times daily.    Marland Kitchen lisinopril-hydrochlorothiazide (PRINZIDE,ZESTORETIC) 20-25 MG per tablet Take 1 tablet by mouth daily.    . Multiple Vitamin (MULTIVITAMIN) tablet Take 1 tablet by mouth daily.    . naproxen (NAPROSYN) 500 MG tablet Take 500 mg by mouth 2 (two) times daily with a meal.     No current facility-administered medications on file prior to visit.    Social History   Social History  . Marital Status: Married    Spouse Name: N/A  . Number of Children: N/A  . Years of Education: N/A   Occupational History  . Not on file.   Social History Main Topics  . Smoking status: Current Every Day Smoker -- 0.25 packs/day for 40 years    Types: Cigarettes  . Smokeless tobacco: Not on file  . Alcohol Use: No  . Drug Use: No  . Sexual Activity: Yes    Birth Control/ Protection: Surgical   Other Topics Concern  . Not on file   Social History Narrative    BP 151/85 mmHg  Pulse 96  Temp(Src) 97.9 F (36.6 C)  Ht 5\' 6"  (1.676 m)  Wt 181 lb 12.8 oz (82.464 kg)  BMI 29.36 kg/m2     Objective:   Physical Exam  Constitutional: She is oriented to person,  place, and time. She appears well-developed and well-nourished.  HENT:  Head: Normocephalic and atraumatic.  Eyes: Conjunctivae and EOM are normal. Pupils are equal, round, and reactive to light.  Neck: Normal range of motion. Neck supple.  Cardiovascular: Normal rate, regular rhythm and intact distal pulses.   Pulmonary/Chest: Effort normal.  Abdominal: Soft.  Musculoskeletal: She exhibits tenderness (Pain of the neck but full motion, full ROM of the shoulders, no weakness, NV intact.).  Neurological: She is alert and oriented to person, place, and time. She displays normal reflexes. No cranial nerve deficit. She exhibits normal muscle tone. Coordination normal.  Skin: Skin is warm and dry.  Psychiatric: She  has a normal mood and affect. Her behavior is normal. Judgment and thought content normal.          Assessment & Plan:   Encounter Diagnosis  Name Primary?  . Neck pain Yes   I have shown her the x-rays done at the hospital today.  She has narrowing at C6-C7 with anterior osteophytes.  She has no fracture.  I will try Naprosyn one bid pc.  She is to stop the Aleve.  I will see her in two weeks.  If not better, I will order a MRI.  Call if any problem.  Precautions discussed.

## 2015-09-24 ENCOUNTER — Encounter: Payer: Self-pay | Admitting: Orthopaedic Surgery

## 2015-09-24 ENCOUNTER — Ambulatory Visit (INDEPENDENT_AMBULATORY_CARE_PROVIDER_SITE_OTHER): Payer: BLUE CROSS/BLUE SHIELD | Admitting: Orthopaedic Surgery

## 2015-09-24 VITALS — BP 127/75 | HR 95 | Temp 97.5°F | Ht 66.0 in | Wt 181.0 lb

## 2015-09-24 DIAGNOSIS — M542 Cervicalgia: Secondary | ICD-10-CM

## 2015-09-24 NOTE — Progress Notes (Signed)
Patient [id number] Christine Poole, female DOB:1950-07-21, 65 y.o. [id number]  Chief Complaint  Patient presents with  . Follow-up    neck pain    HPI  Christine Poole is a 65 y.o. female who has neck pain.  She started on Naprosyn two weeks ago and is improved.  She has no paresthesia.  She has no new pain.  The medicine has not bothered her stomach.  She is feeling better.  HPI  Body mass index is 29.23 kg/(m^2).   Review of Systems  HENT: Negative for congestion.   Respiratory: Negative for cough and shortness of breath.   Cardiovascular: Negative for chest pain and leg swelling.  Endocrine: Positive for cold intolerance.  Musculoskeletal: Positive for arthralgias and neck pain.  Allergic/Immunologic: Positive for environmental allergies.    Past Medical History  Diagnosis Date  . Glaucoma   . Hypertension   . Arthritis   . Hyperplastic polyp of large intestine     Past Surgical History  Procedure Laterality Date  . Abdominal hysterectomy    . Knee arthroscopy Left   . Colonoscopy N/A 08/08/2012    [id number] polyps and internal hemorrhoids.Status post removal rectal polyps  . Colonoscopy N/A 04/16/2014    Procedure: COLONOSCOPY;  Surgeon: Daneil Dolin, MD;  Location: [facility];  Service: Endoscopy;  Laterality: N/A;  1:30pm    History reviewed. No pertinent family history.  Social History Social History  Substance Use Topics  . Smoking status: Current Every Day Smoker -- 0.25 packs/day for 40 years    Types: Cigarettes  . Smokeless tobacco: None  . Alcohol Use: No    No Known Allergies  Current Outpatient Prescriptions  Medication Sig Dispense Refill  . aspirin 81 MG tablet Take 81 mg by mouth daily.    Marland Kitchen diltiazem (CARDIZEM) 90 MG tablet Take 90 mg by mouth 2 (two) times daily.    Marland Kitchen lisinopril-hydrochlorothiazide (PRINZIDE,ZESTORETIC) 20-25 MG per tablet Take 1 tablet by mouth daily.    . Multiple Vitamin (MULTIVITAMIN) tablet Take 1 tablet by  mouth daily.    . naproxen (NAPROSYN) 500 MG tablet Take 500 mg by mouth 2 (two) times daily with a meal.     No current facility-administered medications for this visit.     Physical Exam  Blood pressure 127/75, pulse 95, temperature 97.5 F (36.4 C), height 5\' 6"  (1.676 m), weight 181 lb (82.101 kg).  Constitutional: overall normal hygiene, normal nutrition, well developed, normal grooming, normal body habitus. Assistive device:none  Musculoskeletal: gait and station Limp none, muscle tone and strength are normal, no tremors or atrophy is present.  .  Neurological: coordination overall normal.  Deep tendon reflex/nerve stretch intact.  Sensation normal.  Cranial nerves II-XII intact.   Skin:   normal overall no scars, lesions, ulcers or rashes. No psoriasis.  Psychiatric: Alert and oriented x 3.  Recent memory intact, remote memory unclear.  Normal mood and affect. Well groomed.  Good eye contact.  Cardiovascular: overall no swelling, no varicosities, no edema bilaterally, normal temperatures of the legs and arms, no clubbing, cyanosis and good capillary refill.  Lymphatic: palpation is normal.  Her neck has full motion and no spasm.  Both shoulders are not tender.  NV is intact.  The patient has been educated about the nature of the problem(s) and counseled on treatment options.  The patient appeared to understand what I have discussed and is in agreement with it.  Encounter Diagnosis  Name Primary?  . Neck  pain Yes    PLAN Call if any problems.  Precautions discussed.  Continue current medications.   Return to clinic 1 month

## 2015-10-22 ENCOUNTER — Encounter: Payer: Self-pay | Admitting: Orthopaedic Surgery

## 2015-10-22 ENCOUNTER — Ambulatory Visit (INDEPENDENT_AMBULATORY_CARE_PROVIDER_SITE_OTHER): Payer: BLUE CROSS/BLUE SHIELD | Admitting: Orthopaedic Surgery

## 2015-10-22 VITALS — BP 128/78 | HR 93 | Temp 97.9°F | Ht 66.0 in | Wt 182.0 lb

## 2015-10-22 DIAGNOSIS — M542 Cervicalgia: Secondary | ICD-10-CM | POA: Diagnosis not present

## 2015-10-22 NOTE — Progress Notes (Signed)
Christine Poole:[id number] Christine Poole, female DOB:[birth year], 65 y.o. [id number]  Chief Complaint  Christine presents with  . Follow-up    neck pain    HPI  Christine Poole is a 65 y.o. female who is doing well with her chronic neck pain.  She has no paresthesias.  She has been doing neck exercises and taking her medicine, both of which help.  She is active.  Warmer weather makes her feel better she says.  HPI  Body mass index is 29.39 kg/(m^2).  ROS  Review of Systems  HENT: Negative for congestion.   Respiratory: Negative for cough and shortness of breath.   Cardiovascular: Negative for chest pain and leg swelling.  Endocrine: Positive for cold intolerance.  Musculoskeletal: Positive for arthralgias and neck pain.  Allergic/Immunologic: Positive for environmental allergies.    Past Medical History  Diagnosis Date  . Glaucoma   . Hypertension   . Arthritis   . Hyperplastic polyp of large intestine     Past Surgical History  Procedure Laterality Date  . Abdominal hysterectomy    . Knee arthroscopy Left   . Colonoscopy N/A 08/08/2012    [id number] polyps and internal hemorrhoids.Status post removal rectal polyps  . Colonoscopy N/A 04/16/2014    Procedure: COLONOSCOPY;  Surgeon: Daneil Dolin, MD;  Location: [facility];  Service: Endoscopy;  Laterality: N/A;  1:30pm    History reviewed. No pertinent family history.  Social History Social History  Substance Use Topics  . Smoking status: Current Every Day Smoker -- 0.25 packs/day for 40 years    Types: Cigarettes  . Smokeless tobacco: None  . Alcohol Use: No    No Known Allergies  Current Outpatient Prescriptions  Medication Sig Dispense Refill  . aspirin 81 MG tablet Take 81 mg by mouth daily.    Marland Kitchen diltiazem (CARDIZEM) 90 MG tablet Take 90 mg by mouth 2 (two) times daily.    Marland Kitchen lisinopril-hydrochlorothiazide (PRINZIDE,ZESTORETIC) 20-25 MG per tablet Take 1 tablet by mouth daily.    . Multiple Vitamin  (MULTIVITAMIN) tablet Take 1 tablet by mouth daily.    . naproxen (NAPROSYN) 500 MG tablet Take 500 mg by mouth 2 (two) times daily with a meal.     No current facility-administered medications for this visit.     Physical Exam  Blood pressure 128/78, pulse 93, temperature 97.9 F (36.6 C), height 5\' 6"  (1.676 m), weight 182 lb (82.555 kg).  Constitutional: overall normal hygiene, normal nutrition, well developed, normal grooming, normal body habitus. Assistive device:none  Musculoskeletal: gait and station Limp none, muscle tone and strength are normal, no tremors or atrophy is present.  .  Neurological: coordination overall normal.  Deep tendon reflex/nerve stretch intact.  Sensation normal.  Cranial nerves II-XII intact.   Skin:   normal overall no scars, lesions, ulcers or rashes. No psoriasis.  Psychiatric: Alert and oriented x 3.  Recent memory intact, remote memory unclear.  Normal mood and affect. Well groomed.  Good eye contact.  Cardiovascular: overall no swelling, no varicosities, no edema bilaterally, normal temperatures of the legs and arms, no clubbing, cyanosis and good capillary refill.  Lymphatic: palpation is normal.  Neck has full ROM and no pain. NV intact.  ROM of both shoulders full with no pain.  Grips normal.  The Christine has been educated about the nature of the problem(s) and counseled on treatment options.  The Christine appeared to understand what I have discussed and is in agreement with it.  Encounter  Diagnosis  Name Primary?  . Neck pain Yes    PLAN Call if any problems.  Precautions discussed.  Continue current medications.   Return to clinic 2 months   Electronically Signed Sanjuana Kava, MD 6/13/20179:22 AM

## 2015-12-24 ENCOUNTER — Encounter: Payer: Self-pay | Admitting: Orthopaedic Surgery

## 2015-12-24 ENCOUNTER — Ambulatory Visit (INDEPENDENT_AMBULATORY_CARE_PROVIDER_SITE_OTHER): Payer: BLUE CROSS/BLUE SHIELD | Admitting: Orthopaedic Surgery

## 2015-12-24 VITALS — BP 120/86 | HR 77 | Temp 97.7°F | Ht 67.0 in | Wt 186.0 lb

## 2015-12-24 DIAGNOSIS — Z720 Tobacco use: Secondary | ICD-10-CM | POA: Diagnosis not present

## 2015-12-24 DIAGNOSIS — M542 Cervicalgia: Secondary | ICD-10-CM

## 2015-12-24 DIAGNOSIS — F17200 Nicotine dependence, unspecified, uncomplicated: Secondary | ICD-10-CM

## 2015-12-24 NOTE — Progress Notes (Signed)
Patient [id number] Christine Poole, female DOB:13-Nov-1950, 65 y.o. [id number]  Chief Complaint  Patient presents with  . Follow-up    neck pain    HPI  Christine Poole is a 65 y.o. female who has chronic neck pain. She has no paresthesias or spasm.  She has no new trauma or weakness.  She is taking her medicine.  She is stable.  She has just stopped smoking and I am proud of her.  It has been a few weeks and she continues to avoid cigarettes.  HPI  Body mass index is 29.13 kg/m.  ROS  Review of Systems  HENT: Negative for congestion.   Respiratory: Negative for cough and shortness of breath.   Cardiovascular: Negative for chest pain and leg swelling.  Endocrine: Positive for cold intolerance.  Musculoskeletal: Positive for arthralgias and neck pain.  Allergic/Immunologic: Positive for environmental allergies.    Past Medical History:  Diagnosis Date  . Arthritis   . Glaucoma   . Hyperplastic polyp of large intestine   . Hypertension     Past Surgical History:  Procedure Laterality Date  . ABDOMINAL HYSTERECTOMY    . COLONOSCOPY N/A 08/08/2012   [id number] polyps and internal hemorrhoids.Status post removal rectal polyps  . COLONOSCOPY N/A 04/16/2014   Procedure: COLONOSCOPY;  Surgeon: Daneil Dolin, MD;  Location: [facility];  Service: Endoscopy;  Laterality: N/A;  1:30pm  . KNEE ARTHROSCOPY Left     No family history on file.  Social History Social History  Substance Use Topics  . Smoking status: Current Every Day Smoker    Packs/day: 0.25    Years: 40.00    Types: Cigarettes  . Smokeless tobacco: Not on file  . Alcohol use No    No Known Allergies  Current Outpatient Prescriptions  Medication Sig Dispense Refill  . aspirin 81 MG tablet Take 81 mg by mouth daily.    Marland Kitchen diltiazem (CARDIZEM) 90 MG tablet Take 90 mg by mouth 2 (two) times daily.    Marland Kitchen lisinopril-hydrochlorothiazide (PRINZIDE,ZESTORETIC) 20-25 MG per tablet Take 1 tablet by mouth  daily.    . Multiple Vitamin (MULTIVITAMIN) tablet Take 1 tablet by mouth daily.    . naproxen (NAPROSYN) 500 MG tablet Take 500 mg by mouth 2 (two) times daily with a meal.     No current facility-administered medications for this visit.      Physical Exam  Blood pressure 120/86, pulse 77, temperature 97.7 F (36.5 C), height 5\' 7"  (1.702 m), weight 186 lb (84.4 kg).  Constitutional: overall normal hygiene, normal nutrition, well developed, normal grooming, normal body habitus. Assistive device:none  Musculoskeletal: gait and station Limp none, muscle tone and strength are normal, no tremors or atrophy is present.  .  Neurological: coordination overall normal.  Deep tendon reflex/nerve stretch intact.  Sensation normal.  Cranial nerves II-XII intact.   Skin:   normal overall no scars, lesions, ulcers or rashes. No psoriasis.  Psychiatric: Alert and oriented x 3.  Recent memory intact, remote memory unclear.  Normal mood and affect. Well groomed.  Good eye contact.  Cardiovascular: overall no swelling, no varicosities, no edema bilaterally, normal temperatures of the legs and arms, no clubbing, cyanosis and good capillary refill.  Lymphatic: palpation is normal.  Range of motion of the neck is full.  Reflexes are normal.  NV intact.  The patient has been educated about the nature of the problem(s) and counseled on treatment options.  The patient appeared to understand what I  have discussed and is in agreement with it.  Encounter Diagnoses  Name Primary?  . Neck pain Yes  . Tobacco smoker within last 12 months     PLAN Call if any problems.  Precautions discussed.  Continue current medications.   Return to clinic 2 months

## 2016-01-16 ENCOUNTER — Other Ambulatory Visit (HOSPITAL_COMMUNITY): Payer: Self-pay | Admitting: Family Medicine

## 2016-01-16 DIAGNOSIS — E0590 Thyrotoxicosis, unspecified without thyrotoxic crisis or storm: Secondary | ICD-10-CM

## 2016-01-21 ENCOUNTER — Encounter (HOSPITAL_COMMUNITY)
Admission: RE | Admit: 2016-01-21 | Discharge: 2016-01-21 | Disposition: A | Discharge status: Home / Self Care | Payer: BLUE CROSS/BLUE SHIELD | Source: Ambulatory Visit | Attending: Family Medicine | Admitting: Family Medicine

## 2016-01-21 ENCOUNTER — Encounter (HOSPITAL_COMMUNITY): Payer: Self-pay

## 2016-01-21 DIAGNOSIS — E0590 Thyrotoxicosis, unspecified without thyrotoxic crisis or storm: Secondary | ICD-10-CM | POA: Diagnosis present

## 2016-01-21 DIAGNOSIS — E042 Nontoxic multinodular goiter: Secondary | ICD-10-CM | POA: Diagnosis not present

## 2016-01-21 MED ORDER — SODIUM IODIDE I-123 7.4 MBQ CAPS
200.0000 | ORAL_CAPSULE | Freq: Once | ORAL | Status: AC
  Administered 2016-01-21: 262 via ORAL

## 2016-01-22 ENCOUNTER — Ambulatory Visit (HOSPITAL_COMMUNITY)
Admission: RE | Admit: 2016-01-22 | Discharge: 2016-01-22 | Disposition: A | Discharge status: Home / Self Care | Payer: BLUE CROSS/BLUE SHIELD | Source: Ambulatory Visit | Attending: Family Medicine | Admitting: Family Medicine

## 2016-01-22 DIAGNOSIS — E0590 Thyrotoxicosis, unspecified without thyrotoxic crisis or storm: Secondary | ICD-10-CM | POA: Diagnosis not present

## 2016-02-25 ENCOUNTER — Ambulatory Visit: Payer: BLUE CROSS/BLUE SHIELD | Admitting: Orthopaedic Surgery

## 2016-04-28 ENCOUNTER — Encounter: Payer: Self-pay | Admitting: "Endocrinology

## 2016-04-28 ENCOUNTER — Ambulatory Visit (INDEPENDENT_AMBULATORY_CARE_PROVIDER_SITE_OTHER): Payer: BLUE CROSS/BLUE SHIELD | Admitting: "Endocrinology

## 2016-04-28 VITALS — BP 125/76 | HR 87 | Ht 67.0 in | Wt 197.0 lb

## 2016-04-28 DIAGNOSIS — E0520 Thyrotoxicosis with toxic multinodular goiter without thyrotoxic crisis or storm: Secondary | ICD-10-CM | POA: Diagnosis not present

## 2016-04-28 DIAGNOSIS — E042 Nontoxic multinodular goiter: Secondary | ICD-10-CM | POA: Insufficient documentation

## 2016-04-28 LAB — TSH: TSH: 0.01 m[IU]/L — AB

## 2016-04-28 LAB — T3, FREE: T3 FREE: 8.3 pg/mL — AB (ref 2.3–4.2)

## 2016-04-28 LAB — T4, FREE: FREE T4: 2.4 ng/dL — AB (ref 0.8–1.8)

## 2016-04-28 NOTE — Progress Notes (Signed)
Subjective:    Patient ID: Christine Poole, female    DOB: June 04, 1950, PCP Elyn Peers, MD   Past Medical History:  Diagnosis Date  . Arthritis   . Glaucoma   . Hyperplastic polyp of large intestine   . Hypertension    Past Surgical History:  Procedure Laterality Date  . ABDOMINAL HYSTERECTOMY    . COLONOSCOPY N/A 08/08/2012   [id number] polyps and internal hemorrhoids.Status post removal rectal polyps  . COLONOSCOPY N/A 04/16/2014   Procedure: COLONOSCOPY;  Surgeon: Daneil Dolin, MD;  Location: [facility];  Service: Endoscopy;  Laterality: N/A;  1:30pm  . KNEE ARTHROSCOPY Left    Social History   Social History  . Marital status: Married    Spouse name: N/A  . Number of children: N/A  . Years of education: N/A   Social History Main Topics  . Smoking status: Former Smoker    Packs/day: 0.00    Years: 40.00    Types: Cigarettes  . Smokeless tobacco: Never Used  . Alcohol use No  . Drug use: No  . Sexual activity: Yes    Birth control/ protection: Surgical   Other Topics Concern  . None   Social History Narrative  . None   Outpatient Encounter Prescriptions as of 04/28/2016  Medication Sig  . aspirin 81 MG tablet Take 81 mg by mouth daily.  Marland Kitchen diltiazem (CARDIZEM) 90 MG tablet Take 90 mg by mouth 2 (two) times daily.  Marland Kitchen etodolac (LODINE) 400 MG tablet Take 400 mg by mouth 2 (two) times daily.  Marland Kitchen latanoprost (XALATAN) 0.005 % ophthalmic solution 1 drop at bedtime.  Marland Kitchen lisinopril-hydrochlorothiazide (PRINZIDE,ZESTORETIC) 20-25 MG per tablet Take 1 tablet by mouth daily.  . Multiple Vitamin (MULTIVITAMIN) tablet Take 1 tablet by mouth daily.  . naproxen (NAPROSYN) 500 MG tablet Take 500 mg by mouth 2 (two) times daily with a meal.   No facility-administered encounter medications on file as of 04/28/2016.    ALLERGIES: No Known Allergies VACCINATION STATUS:  There is no immunization history on file for this patient.  HPI 65 year old female  patient with medical history as above. She is being seen in consultation for multinodular goiter requested by Dr. Criss Rosales. She reports that she was told to have thyroid problem for "several years ". She was never given antithyroid therapy specifically [place] and in therapy nor thyroidectomy. 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 -She denies weight loss fact she complains of weight gain more recently, denies  palpitations, nor tremors. She reports sleep disturbance. She complains of cold intolerance.  - She has other comorbidities including high blood pressure and high cholesterol , she is not on a beta blocker. - She denies family history of thyroid problem. She denies any neck exposure to radiation. She feels on and off choking sensation in her neck and occasional voice change.  Review of Systems Constitutional:  +weight gain, - fatigue, + subjective hypothermia Eyes: no blurry vision, no xerophthalmia ENT: no sore throat, + nodules palpated in her thyroid, no dysphagia/odynophagia, + occasional hoarseness Cardiovascular: no CP/SOB/palpitations/leg swelling Respiratory: no cough/SOB Gastrointestinal: no N/V/D/C Musculoskeletal: no muscle/joint aches Skin: no rashes Neurological: no tremors/numbness/tingling/dizziness Psychiatric: no depression/anxiety  Objective:    BP 125/76   Pulse 87   Ht 5\' 7"  (1.702 m)   Wt 197 lb (89.4 kg)   BMI 30.85  kg/m   Wt Readings from Last 3 Encounters:  04/28/16 197 lb (89.4 kg)  12/24/15 186 lb (84.4 kg)  10/22/15 182 lb (82.6 kg)    Physical Exam Constitutional: overweight, in NAD Eyes: PERRLA, EOMI, no exophthalmos ENT: moist mucous membranes, + nodular thyromegaly R>L, no cervical lymphadenopathy Cardiovascular: RRR, No MRG Respiratory: CTA B Gastrointestinal: abdomen soft, NT, ND, BS+ Musculoskeletal: no deformities,  strength intact in all 4 Skin: moist, warm, no rashes Neurological: + tremor with outstretched hands, DTR brisk on BLE.   Labs from 12/31/2015 showed free T4 elevated at 3.1 (normal 0.8-1 0.8); free T3 elevated at 9.1 (normal libido 4.2); TSH suppressed at 0.01. -On 01/22/2016 her thyroid uptake and scan showed general uptake of 30%, multiple toxic nodules on bilateral lobes. Review of her medical records showed an ultrasound of the thyroid from 2014 showed right lobe is large at 6 cm x 3.7 cm with a 4.2 cm nodule, left lobe at 5.1 x 1.4 cm with 3 nodules largest 1.4 cm.  Assessment & Plan:   1. Toxic multinodul goiter - This patient is being seen at the kind request of Dr. Criss Rosales. I have reviewed her available thyroid workup records and clinically evaluated patient. Her workup is consistent with toxic multinodular goiter, lab work from August 2017 and nuclear medicine test in September 2017. - However, her clinical symptoms are nonspecific or conflicting at this time. Her thyroid function test needs to be repeated today. If she still has significant thyroid hormone burden and if it  agrees with the thyroid uptake and scan results from September 2017, she will be considered for thyroid ablative therapy with I-131. - She will return in 1 week with thyroid function test results.  - I advised patient to maintain close follow up with Elyn Peers, MD for primary care needs. Follow up plan: Return in about 1 week (around 05/05/2016) for labs today.  Glade Lloyd, MD Phone: [phone]  Fax: [phone]   04/28/2016, 10:05 AM

## 2016-04-29 LAB — THYROGLOBULIN ANTIBODY: Thyroglobulin Ab: <1 IU/mL (ref <2)

## 2016-04-29 LAB — THYROID PEROXIDASE ANTIBODY: THYROID PEROXIDASE ANTIBODY: 1 [IU]/mL (ref <9)

## 2016-04-30 ENCOUNTER — Ambulatory Visit: Payer: Self-pay | Admitting: "Endocrinology

## 2016-05-06 ENCOUNTER — Encounter: Payer: Self-pay | Admitting: "Endocrinology

## 2016-05-06 ENCOUNTER — Ambulatory Visit (INDEPENDENT_AMBULATORY_CARE_PROVIDER_SITE_OTHER): Payer: BLUE CROSS/BLUE SHIELD | Admitting: "Endocrinology

## 2016-05-06 VITALS — BP 122/82 | HR 116 | Ht 67.0 in | Wt 196.0 lb

## 2016-05-06 DIAGNOSIS — E0520 Thyrotoxicosis with toxic multinodular goiter without thyrotoxic crisis or storm: Secondary | ICD-10-CM | POA: Diagnosis not present

## 2016-05-06 NOTE — Progress Notes (Signed)
Subjective:    Patient ID: Christine Poole, female    DOB: 1950-10-02, PCP Elyn Peers, MD   Past Medical History:  Diagnosis Date  . Arthritis   . Glaucoma   . Hyperplastic polyp of large intestine   . Hypertension    Past Surgical History:  Procedure Laterality Date  . ABDOMINAL HYSTERECTOMY    . COLONOSCOPY N/A 08/08/2012   [id number] polyps and internal hemorrhoids.Status post removal rectal polyps  . COLONOSCOPY N/A 04/16/2014   Procedure: COLONOSCOPY;  Surgeon: Daneil Dolin, MD;  Location: [facility];  Service: Endoscopy;  Laterality: N/A;  1:30pm  . KNEE ARTHROSCOPY Left    Social History   Social History  . Marital status: Married    Spouse name: N/A  . Number of children: N/A  . Years of education: N/A   Social History Main Topics  . Smoking status: Former Smoker    Packs/day: 0.00    Years: 40.00    Types: Cigarettes  . Smokeless tobacco: Never Used  . Alcohol use No  . Drug use: No  . Sexual activity: Yes    Birth control/ protection: Surgical   Other Topics Concern  . None   Social History Narrative  . None   Outpatient Encounter Prescriptions as of 05/06/2016  Medication Sig  . aspirin 81 MG tablet Take 81 mg by mouth daily.  Marland Kitchen diltiazem (CARDIZEM) 90 MG tablet Take 90 mg by mouth 2 (two) times daily.  Marland Kitchen etodolac (LODINE) 400 MG tablet Take 400 mg by mouth 2 (two) times daily.  Marland Kitchen latanoprost (XALATAN) 0.005 % ophthalmic solution 1 drop at bedtime.  Marland Kitchen lisinopril-hydrochlorothiazide (PRINZIDE,ZESTORETIC) 20-25 MG per tablet Take 1 tablet by mouth daily.  . Multiple Vitamin (MULTIVITAMIN) tablet Take 1 tablet by mouth daily.  . naproxen (NAPROSYN) 500 MG tablet Take 500 mg by mouth 2 (two) times daily with a meal.   No facility-administered encounter medications on file as of 05/06/2016.    ALLERGIES: No Known Allergies VACCINATION STATUS:  There is no immunization history on file for this patient.  HPI 65 year old female  patient with medical history as above. 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 Criss Rosales. She reports that she was told to have thyroid problem for "several years ". She was never given antithyroid therapy specifically [place] and in therapy nor thyroidectomy. 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 -She denies weight loss fact she complains of weight gain more recently, denies  palpitations, nor tremors. She reports sleep disturbance. She complains of cold intolerance.  - She has other comorbidities including high blood pressure and high cholesterol , she is not on a beta blocker. - She denies family history of thyroid problem. She denies any neck exposure to radiation. She feels on and off choking sensation in her neck and occasional voice change.  Review of Systems Constitutional:  - weight gain, - fatigue, + subjective hypothermia Eyes: no blurry vision, no xerophthalmia ENT: no sore throat, + nodules palpated in her thyroid, no dysphagia/odynophagia, + occasional hoarseness Cardiovascular: no CP/SOB/palpitations/leg swelling Respiratory: no cough/SOB Gastrointestinal: no N/V/D/C Musculoskeletal: no muscle/joint aches Skin: no rashes Neurological: no tremors/numbness/tingling/dizziness Psychiatric: no depression/anxiety  Objective:    BP 122/82   Pulse (!) 116   Ht 5\' 7"  (1.702 m)   Wt 196 lb (88.9 kg)  BMI 30.70 kg/m   Wt Readings from Last 3 Encounters:  05/06/16 196 lb (88.9 kg)  04/28/16 197 lb (89.4 kg)  12/24/15 186 lb (84.4 kg)    Physical Exam Constitutional: overweight, in NAD Eyes: PERRLA, EOMI, no exophthalmos ENT: moist mucous membranes, + nodular thyromegaly R>L, no cervical lymphadenopathy Cardiovascular: RRR, No MRG Respiratory: CTA B Gastrointestinal: abdomen soft, NT, ND, BS+ Musculoskeletal: no deformities,  strength intact in all 4 Skin: moist, warm, no rashes Neurological: + tremor with outstretched hands, DTR brisk on BLE.   Recent Results (from the past 2160 hour(s))  TSH     Status: Abnormal   Collection Time: 04/28/16 10:08 AM  Result Value Ref Range   TSH 0.01 (L) mIU/L    Comment:   Reference Range   > or = 20 Years  0.40-4.50   Pregnancy Range First trimester  0.26-2.66 Second trimester 0.55-2.73 Third trimester  0.43-2.91     T4, free     Status: Abnormal   Collection Time: 04/28/16 10:08 AM  Result Value Ref Range   Free T4 2.4 (H) 0.8 - 1.8 ng/dL  Thyroglobulin antibody     Status: None   Collection Time: 04/28/16 10:08 AM  Result Value Ref Range   Thyroglobulin Ab <1 <2 IU/mL  Thyroid peroxidase antibody     Status: None   Collection Time: 04/28/16 10:08 AM  Result Value Ref Range   Thyroperoxidase Ab SerPl-aCnc 1 <9 IU/mL  T3, free     Status: Abnormal   Collection Time: 04/28/16 10:08 AM  Result Value Ref Range   T3, Free 8.3 (H) 2.3 - 4.2 pg/mL     Labs from 12/31/2015 showed free T4 elevated at 3.1 (normal 0.8-1 0.8); free T3 elevated at 9.1 (normal libido 4.2); TSH suppressed at 0.01. -On 01/22/2016 her thyroid uptake and scan showed general uptake of 30%, multiple toxic nodules on bilateral lobes. Review of her medical records showed an ultrasound of the thyroid from 2014 showed right lobe is large at 6 cm x 3.7 cm with a 4.2 cm nodule, left lobe at 5.1 x 1.4 cm with 3 nodules largest 1.4 cm.  Assessment & Plan:   1. Toxic multinodul goiter - Her repeat thyroid function tests confirm clinically significant hyperthyroidism confirmed by high normal uptake of 30% on the background of multinodular goiter- specifically hyper functioning right lobe nodule of 4.2 cm.  This is consistent with toxic multinodular goiter. She would benefit from definite antithyroid therapy. Therapy over choice would be radioactive iodine ablation. This will be scheduled to be  administered as soon as possible. - She understands the subsequent need for thyroid hormone replacement. She will return in 9 weeks with repeat thyroid function tests. - I advised patient to maintain close follow up with Elyn Peers, MD for primary care needs. Follow up plan: Return in about 9 weeks (around 07/08/2016) for follow up with pre-visit labs.  Glade Lloyd, MD Phone: [phone]  Fax: [phone]   05/06/2016, 3:01 PM

## 2016-05-22 ENCOUNTER — Encounter (HOSPITAL_COMMUNITY): Payer: BLUE CROSS/BLUE SHIELD

## 2016-05-22 ENCOUNTER — Encounter (HOSPITAL_COMMUNITY)
Admission: RE | Admit: 2016-05-22 | Discharge: 2016-05-22 | Disposition: A | Discharge status: Home / Self Care | Payer: BLUE CROSS/BLUE SHIELD | Source: Ambulatory Visit | Attending: "Endocrinology | Admitting: "Endocrinology

## 2016-05-22 ENCOUNTER — Encounter (HOSPITAL_COMMUNITY): Payer: Self-pay

## 2016-05-22 DIAGNOSIS — E0520 Thyrotoxicosis with toxic multinodular goiter without thyrotoxic crisis or storm: Secondary | ICD-10-CM | POA: Insufficient documentation

## 2016-05-22 MED ORDER — SODIUM IODIDE I 131 CAPSULE
30.0000 | Freq: Once | INTRAVENOUS | Status: AC | PRN
  Administered 2016-05-22: 30.7 via ORAL

## 2016-05-25 ENCOUNTER — Encounter (HOSPITAL_COMMUNITY): Payer: BLUE CROSS/BLUE SHIELD

## 2016-06-09 ENCOUNTER — Other Ambulatory Visit (HOSPITAL_COMMUNITY): Payer: Self-pay | Admitting: Family Medicine

## 2016-06-09 DIAGNOSIS — Z1231 Encounter for screening mammogram for malignant neoplasm of breast: Secondary | ICD-10-CM

## 2016-06-15 ENCOUNTER — Encounter (HOSPITAL_COMMUNITY): Payer: Self-pay | Admitting: Radiology

## 2016-06-15 ENCOUNTER — Ambulatory Visit (HOSPITAL_COMMUNITY)
Admission: RE | Admit: 2016-06-15 | Discharge: 2016-06-15 | Disposition: A | Discharge status: Home / Self Care | Payer: BLUE CROSS/BLUE SHIELD | Source: Ambulatory Visit | Attending: Family Medicine | Admitting: Family Medicine

## 2016-06-15 DIAGNOSIS — Z1231 Encounter for screening mammogram for malignant neoplasm of breast: Secondary | ICD-10-CM

## 2016-06-30 ENCOUNTER — Other Ambulatory Visit: Payer: Self-pay | Admitting: "Endocrinology

## 2016-06-30 LAB — TSH: TSH: <0.01 mIU/L — ABNORMAL LOW

## 2016-06-30 LAB — T4, FREE: Free T4: 2.3 ng/dL — ABNORMAL HIGH (ref 0.8–1.8)

## 2016-07-13 ENCOUNTER — Encounter: Payer: Self-pay | Admitting: "Endocrinology

## 2016-07-13 ENCOUNTER — Ambulatory Visit (INDEPENDENT_AMBULATORY_CARE_PROVIDER_SITE_OTHER): Payer: BLUE CROSS/BLUE SHIELD | Admitting: "Endocrinology

## 2016-07-13 VITALS — BP 117/78 | HR 105 | Ht 67.0 in | Wt 199.0 lb

## 2016-07-13 DIAGNOSIS — E0520 Thyrotoxicosis with toxic multinodular goiter without thyrotoxic crisis or storm: Secondary | ICD-10-CM

## 2016-07-13 NOTE — Progress Notes (Signed)
Subjective:    Patient ID: Christine Poole, female    DOB: 1950-09-23, PCP Elyn Peers, MD   Past Medical History:  Diagnosis Date  . Arthritis   . Glaucoma   . Hyperplastic polyp of large intestine   . Hypertension    Past Surgical History:  Procedure Laterality Date  . ABDOMINAL HYSTERECTOMY    . COLONOSCOPY N/A 08/08/2012   [id number] polyps and internal hemorrhoids.Status post removal rectal polyps  . COLONOSCOPY N/A 04/16/2014   Procedure: COLONOSCOPY;  Surgeon: Daneil Dolin, MD;  Location: [facility];  Service: Endoscopy;  Laterality: N/A;  1:30pm  . KNEE ARTHROSCOPY Left    Social History   Social History  . Marital status: Married    Spouse name: N/A  . Number of children: N/A  . Years of education: N/A   Social History Main Topics  . Smoking status: Former Smoker    Packs/day: 0.00    Years: 40.00    Types: Cigarettes  . Smokeless tobacco: Never Used  . Alcohol use No  . Drug use: No  . Sexual activity: Yes    Birth control/ protection: Surgical   Other Topics Concern  . None   Social History Narrative  . None   Outpatient Encounter Prescriptions as of 07/13/2016  Medication Sig  . aspirin 81 MG tablet Take 81 mg by mouth daily.  Marland Kitchen diltiazem (CARDIZEM) 90 MG tablet Take 90 mg by mouth 2 (two) times daily.  Marland Kitchen etodolac (LODINE) 400 MG tablet Take 400 mg by mouth 2 (two) times daily.  Marland Kitchen latanoprost (XALATAN) 0.005 % ophthalmic solution 1 drop at bedtime.  Marland Kitchen lisinopril-hydrochlorothiazide (PRINZIDE,ZESTORETIC) 20-25 MG per tablet Take 1 tablet by mouth daily.  . Multiple Vitamin (MULTIVITAMIN) tablet Take 1 tablet by mouth daily.  . naproxen (NAPROSYN) 500 MG tablet Take 500 mg by mouth 2 (two) times daily with a meal.   No facility-administered encounter medications on file as of 07/13/2016.    ALLERGIES: No Known Allergies VACCINATION STATUS:  There is no immunization history on file for this patient.  HPI 66 year old female patient  with medical history as above. 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 Criss Rosales. She reports that she was told to have thyroid problem for "several years ". She was never given antithyroid therapy specifically [place] and in therapy nor thyroidectomy. 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 -She denies weight loss fact she complains of weight gain more recently, denies  palpitations, nor tremors. She reports sleep disturbance. She complains of cold intolerance.  - She has other comorbidities including high blood pressure and high cholesterol , she is not on a beta blocker. - She denies family history of thyroid problem. She denies any neck exposure to radiation.   Review of Systems Constitutional:  She has no new complaints today. Eyes: no blurry vision, no xerophthalmia ENT: no sore throat, + nodules palpated in her thyroid, no dysphagia/odynophagia, + occasional hoarseness Cardiovascular: no CP/SOB/palpitations/leg swelling Respiratory: no cough/SOB Gastrointestinal: no N/V/D/C Musculoskeletal: no muscle/joint aches Skin: no rashes Neurological: no tremors/numbness/tingling/dizziness Psychiatric: no depression/anxiety  Objective:    BP 117/78   Pulse (!) 105   Ht 5\' 7"  (1.702 m)   Wt 199 lb (90.3 kg)   BMI 31.17 kg/m   Wt Readings from Last 3 Encounters:  07/13/16  199 lb (90.3 kg)  05/06/16 196 lb (88.9 kg)  04/28/16 197 lb (89.4 kg)    Physical Exam Constitutional: overweight, in NAD Eyes: PERRLA, EOMI, no exophthalmos ENT: moist mucous membranes, + nodular thyromegaly R>L, no cervical lymphadenopathy Cardiovascular: RRR, No MRG Respiratory: CTA B Gastrointestinal: abdomen soft, NT, ND, BS+ Musculoskeletal: no deformities, strength intact in all 4 Skin: moist, warm, no rashes Neurological: + tremor with outstretched hands,  DTR brisk on BLE.   Recent Results (from the past 2160 hour(s))  TSH     Status: Abnormal   Collection Time: 04/28/16 10:08 AM  Result Value Ref Range   TSH 0.01 (L) mIU/L    Comment:   Reference Range   > or = 20 Years  0.40-4.50   Pregnancy Range First trimester  0.26-2.66 Second trimester 0.55-2.73 Third trimester  0.43-2.91     T4, free     Status: Abnormal   Collection Time: 04/28/16 10:08 AM  Result Value Ref Range   Free T4 2.4 (H) 0.8 - 1.8 ng/dL  Thyroglobulin antibody     Status: None   Collection Time: 04/28/16 10:08 AM  Result Value Ref Range   Thyroglobulin Ab <1 <2 IU/mL  Thyroid peroxidase antibody     Status: None   Collection Time: 04/28/16 10:08 AM  Result Value Ref Range   Thyroperoxidase Ab SerPl-aCnc 1 <9 IU/mL  T3, free     Status: Abnormal   Collection Time: 04/28/16 10:08 AM  Result Value Ref Range   T3, Free 8.3 (H) 2.3 - 4.2 pg/mL  TSH     Status: Abnormal   Collection Time: 06/30/16  7:47 AM  Result Value Ref Range   TSH <0.01 (L) mIU/L    Comment:   Reference Range   > or = 20 Years  0.40-4.50   Pregnancy Range First trimester  0.26-2.66 Second trimester 0.55-2.73 Third trimester  0.43-2.91     T4, free     Status: Abnormal   Collection Time: 06/30/16  7:47 AM  Result Value Ref Range   Free T4 2.3 (H) 0.8 - 1.8 ng/dL    Assessment & Plan:   1. Toxic multinodul goiter - She is status post RAI thyroid ablation on January12,  2018. - There is clinical evidence that treatment is taking effect. She would not required thyroid hormone replacement at this time. She will have repeat thyroid function test in 8 weeks with office visit in 9 weeks. - I advised patient to maintain close follow up with Elyn Peers, MD for primary care needs. Follow up plan: Return in about 9 weeks (around 09/14/2016) for follow up with pre-visit labs.  Glade Lloyd, MD Phone: [phone]  Fax: [phone]   07/13/2016, 4:06 PM

## 2016-08-31 ENCOUNTER — Other Ambulatory Visit: Payer: Self-pay | Admitting: "Endocrinology

## 2016-08-31 LAB — TSH: TSH: 0.01 mIU/L — ABNORMAL LOW

## 2016-08-31 LAB — T4, FREE: FREE T4: 1.6 ng/dL (ref 0.8–1.8)

## 2016-09-14 ENCOUNTER — Ambulatory Visit (INDEPENDENT_AMBULATORY_CARE_PROVIDER_SITE_OTHER): Payer: BLUE CROSS/BLUE SHIELD | Admitting: "Endocrinology

## 2016-09-14 ENCOUNTER — Encounter: Payer: Self-pay | Admitting: "Endocrinology

## 2016-09-14 VITALS — BP 126/79 | HR 68 | Ht 67.0 in | Wt 202.0 lb

## 2016-09-14 DIAGNOSIS — E0520 Thyrotoxicosis with toxic multinodular goiter without thyrotoxic crisis or storm: Secondary | ICD-10-CM

## 2016-09-14 NOTE — Progress Notes (Signed)
Subjective:    Patient ID: Christine Poole, female    DOB: 06/15/1950, PCP Lucianne Lei, MD   Past Medical History:  Diagnosis Date  . Arthritis   . Glaucoma   . Hyperplastic polyp of large intestine   . Hypertension    Past Surgical History:  Procedure Laterality Date  . ABDOMINAL HYSTERECTOMY    . COLONOSCOPY N/A 08/08/2012   [id number] polyps and internal hemorrhoids.Status post removal rectal polyps  . COLONOSCOPY N/A 04/16/2014   Procedure: COLONOSCOPY;  Surgeon: Daneil Dolin, MD;  Location: [facility];  Service: Endoscopy;  Laterality: N/A;  1:30pm  . KNEE ARTHROSCOPY Left    Social History   Social History  . Marital status: Married    Spouse name: N/A  . Number of children: N/A  . Years of education: N/A   Social History Main Topics  . Smoking status: Former Smoker    Packs/day: 0.00    Years: 40.00    Types: Cigarettes  . Smokeless tobacco: Never Used  . Alcohol use No  . Drug use: No  . Sexual activity: Yes    Birth control/ protection: Surgical   Other Topics Concern  . None   Social History Narrative  . None   Outpatient Encounter Prescriptions as of 09/14/2016  Medication Sig  . aspirin 81 MG tablet Take 81 mg by mouth daily.  Marland Kitchen diltiazem (CARDIZEM) 90 MG tablet Take 90 mg by mouth 2 (two) times daily.  Marland Kitchen etodolac (LODINE) 400 MG tablet Take 400 mg by mouth 2 (two) times daily.  Marland Kitchen latanoprost (XALATAN) 0.005 % ophthalmic solution 1 drop at bedtime.  Marland Kitchen lisinopril-hydrochlorothiazide (PRINZIDE,ZESTORETIC) 20-25 MG per tablet Take 1 tablet by mouth daily.  . Multiple Vitamin (MULTIVITAMIN) tablet Take 1 tablet by mouth daily.  . naproxen (NAPROSYN) 500 MG tablet Take 500 mg by mouth 2 (two) times daily with a meal.   No facility-administered encounter medications on file as of 09/14/2016.    ALLERGIES: No Known Allergies VACCINATION STATUS:  There is no immunization history on file for this patient.  HPI 66 year old female patient  with medical history as above. 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 Criss Rosales. She reports that she was told to have thyroid problem for "several years ".  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 - She reports feeling better, no new complaints.  - She has other comorbidities including high blood pressure and high cholesterol , she is not on a beta blocker. - She denies family history of thyroid problem. She denies any neck exposure to radiation.   Review of Systems Constitutional:  She has no new complaints. Eyes: no blurry vision, no xerophthalmia ENT: no sore throat, + nodules palpated in her thyroid, no dysphagia/odynophagia, + occasional hoarseness Cardiovascular: no CP/SOB/palpitations/leg swelling Respiratory: no cough/SOB Gastrointestinal: no N/V/D/C Musculoskeletal: no muscle/joint aches Skin: no rashes Neurological: no tremors/numbness/tingling/dizziness Psychiatric: no depression/anxiety  Objective:    BP 126/79   Pulse 68   Ht 5\' 7"  (1.702 m)   Wt 202 lb (91.6 kg)   BMI 31.64 kg/m   Wt Readings from Last 3 Encounters:  09/14/16 202 lb (91.6 kg)  07/13/16 199 lb (90.3 kg)  05/06/16 196 lb (88.9 kg)    Physical Exam Constitutional: overweight, in NAD Eyes: PERRLA, EOMI, no exophthalmos ENT: moist mucous membranes, +  nodular thyromegaly R>L, no cervical lymphadenopathy Cardiovascular: RRR, No MRG Respiratory: CTA B Gastrointestinal: abdomen soft, NT, ND, BS+ Musculoskeletal: no deformities, strength intact in all 4 Skin: moist, warm, no rashes Neurological: - tremor with outstretched hands, DTR normal on BLE.  Results for Christine Poole, Christine Poole (MRN [id number]) as of 09/14/2016 10:01  Ref. Range 04/28/2016 10:08 06/30/2016 07:47 08/31/2016 07:29  TSH Latest Units: mIU/L 0.01 (L) <0.01 (L) 0.01 (L)  Triiodothyronine,Free,Serum Latest  Ref Range: 2.3 - 4.2 pg/mL 8.3 (H)    T4,Free(Direct) Latest Ref Range: 0.8 - 1.8 ng/dL 2.4 (H) 2.3 (H) 1.6  Thyroglobulin Ab Latest Ref Range: <2 IU/mL <1    Thyroperoxidase Ab SerPl-aCnc Latest Ref Range: <9 IU/mL 1       Assessment & Plan:   1. Toxic multinodul goiter - She is status post RAI thyroid ablation on January12,  2018. - There is clinical evidence that treatment is taking effect. She will not required thyroid hormone replacement at this time. She will have repeat thyroid function test in 8 weeks with office visit in 9 weeks. - I advised patient to maintain close follow up with Lucianne Lei, MD for primary care needs. Follow up plan: Return in about 9 weeks (around 11/16/2016) for follow up with pre-visit labs.  Glade Lloyd, MD Phone: [phone]  Fax: [phone]   09/14/2016, 9:57 AM

## 2016-11-09 ENCOUNTER — Other Ambulatory Visit: Payer: Self-pay | Admitting: "Endocrinology

## 2016-11-09 LAB — T4, FREE: FREE T4: 1.1 ng/dL (ref 0.8–1.8)

## 2016-11-09 LAB — TSH: TSH: <0.01 mIU/L — ABNORMAL LOW

## 2016-11-16 ENCOUNTER — Ambulatory Visit (INDEPENDENT_AMBULATORY_CARE_PROVIDER_SITE_OTHER): Payer: BLUE CROSS/BLUE SHIELD | Admitting: "Endocrinology

## 2016-11-16 ENCOUNTER — Encounter: Payer: Self-pay | Admitting: "Endocrinology

## 2016-11-16 VITALS — BP 123/83 | HR 63 | Ht 67.0 in | Wt 205.0 lb

## 2016-11-16 DIAGNOSIS — E0520 Thyrotoxicosis with toxic multinodular goiter without thyrotoxic crisis or storm: Secondary | ICD-10-CM

## 2016-11-16 NOTE — Progress Notes (Signed)
Subjective:    Patient ID: Christine Poole, female    DOB: Jul 04, 1950, PCP Lucianne Lei, MD   Past Medical History:  Diagnosis Date  . Arthritis   . Glaucoma   . Hyperplastic polyp of large intestine   . Hypertension    Past Surgical History:  Procedure Laterality Date  . ABDOMINAL HYSTERECTOMY    . COLONOSCOPY N/A 08/08/2012   [id number] polyps and internal hemorrhoids.Status post removal rectal polyps  . COLONOSCOPY N/A 04/16/2014   Procedure: COLONOSCOPY;  Surgeon: Daneil Dolin, MD;  Location: [facility];  Service: Endoscopy;  Laterality: N/A;  1:30pm  . KNEE ARTHROSCOPY Left    Social History   Social History  . Marital status: Married    Spouse name: N/A  . Number of children: N/A  . Years of education: N/A   Social History Main Topics  . Smoking status: Former Smoker    Packs/day: 0.00    Years: 40.00    Types: Cigarettes  . Smokeless tobacco: Never Used  . Alcohol use No  . Drug use: No  . Sexual activity: Yes    Birth control/ protection: Surgical   Other Topics Concern  . None   Social History Narrative  . None   Outpatient Encounter Prescriptions as of 11/16/2016  Medication Sig  . aspirin 81 MG tablet Take 81 mg by mouth daily.  Marland Kitchen diltiazem (CARDIZEM) 90 MG tablet Take 90 mg by mouth 2 (two) times daily.  Marland Kitchen etodolac (LODINE) 400 MG tablet Take 400 mg by mouth 2 (two) times daily.  Marland Kitchen latanoprost (XALATAN) 0.005 % ophthalmic solution 1 drop at bedtime.  Marland Kitchen lisinopril-hydrochlorothiazide (PRINZIDE,ZESTORETIC) 20-25 MG per tablet Take 1 tablet by mouth daily.  . Multiple Vitamin (MULTIVITAMIN) tablet Take 1 tablet by mouth daily.  . naproxen (NAPROSYN) 500 MG tablet Take 500 mg by mouth 2 (two) times daily with a meal.   No facility-administered encounter medications on file as of 11/16/2016.    ALLERGIES: No Known Allergies VACCINATION STATUS:  There is no immunization history on file for this patient.  HPI 66 year old female patient  with medical history as above. 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 Criss Rosales. 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 -  She was given radioactive and therapy on 05/22/2016. - She reports feeling better, no new complaints.  - She has other comorbidities including high blood pressure and high cholesterol , she is not on a beta blocker. - She denies family history of thyroid problem. She denies any neck exposure to radiation.   Review of Systems Constitutional:  She has no new complaints. Eyes: no blurry vision, no xerophthalmia ENT: no sore throat, + nodules palpated in her thyroid, no dysphagia/odynophagia, + occasional hoarseness Cardiovascular: no CP/SOB/palpitations/leg swelling Respiratory: no cough/SOB Gastrointestinal: no N/V/D/C Musculoskeletal: no muscle/joint aches Skin: no rashes Neurological: no tremors/numbness/tingling/dizziness Psychiatric: no depression/anxiety  Objective:    BP 123/83   Pulse 63   Ht 5\' 7"  (1.702 m)   Wt 205 lb (93 kg)   BMI 32.11 kg/m   Wt Readings from Last 3 Encounters:  11/16/16 205 lb (93 kg)  09/14/16 202 lb (91.6 kg)  07/13/16 199 lb (90.3 kg)    Physical Exam Constitutional: overweight, in NAD Eyes: PERRLA, EOMI, no exophthalmos ENT: moist mucous membranes, + nodular thyromegaly R>L, no  cervical lymphadenopathy Cardiovascular: RRR, No MRG Respiratory: CTA B Gastrointestinal: abdomen soft, NT, ND, BS+ Musculoskeletal: no deformities, strength intact in all 4 Skin: moist, warm, no rashes Neurological: - tremor with outstretched hands, DTR normal on BLE.  Results for GIOVANNINA, MUN (MRN [id number]) as of 11/16/2016 10:47  Ref. Range 06/30/2016 07:47 08/31/2016 07:29 11/09/2016 07:42  TSH Latest Units: mIU/L <0.01 (L) 0.01 (L) <0.01 (L)  T4,Free(Direct) Latest Ref Range: 0.8 - 1.8 ng/dL 2.3 (H) 1.6  1.1    Assessment & Plan:   1. Toxic multinodul goiter - She is status post RAI thyroid ablation on January12,  2018. - There is clinical evidence that treatment is taking effect. TSH still suppressed at <0.01 1 free T4 continues to drop to 1.1 from 2.3, unlikely treatment failure. - She will not required thyroid hormone replacement at this time. She will have repeat thyroid function test in 12 weeks with office visit .  - I advised patient to maintain close follow up with Lucianne Lei, MD for primary care needs. Follow up plan: Return in about 3 months (around 02/16/2017) for follow up with pre-visit labs.  Glade Lloyd, MD Phone: [phone]  Fax: [phone]   11/16/2016, 10:47 AM

## 2017-02-22 LAB — TSH: TSH: 0.1 m[IU]/L — AB (ref 0.40–4.50)

## 2017-02-22 LAB — T4, FREE: Free T4: 0.7 ng/dL — ABNORMAL LOW (ref 0.8–1.8)

## 2017-03-01 ENCOUNTER — Ambulatory Visit (INDEPENDENT_AMBULATORY_CARE_PROVIDER_SITE_OTHER): Payer: BLUE CROSS/BLUE SHIELD | Admitting: "Endocrinology

## 2017-03-01 ENCOUNTER — Encounter: Payer: Self-pay | Admitting: "Endocrinology

## 2017-03-01 VITALS — BP 142/91 | HR 71 | Ht 67.0 in | Wt 206.0 lb

## 2017-03-01 DIAGNOSIS — E890 Postprocedural hypothyroidism: Secondary | ICD-10-CM | POA: Insufficient documentation

## 2017-03-01 MED ORDER — LEVOTHYROXINE SODIUM 50 MCG PO TABS: 50.0000 ug | ORAL_TABLET | Freq: Every day | ORAL | 3 refills | Status: DC

## 2017-03-01 NOTE — Progress Notes (Signed)
Subjective:    Patient ID: Christine Poole, female    DOB: 1950/09/17, PCP Christine Lei, MD   Past Medical History:  Diagnosis Date  . Arthritis   . Glaucoma   . Hyperplastic polyp of large intestine   . Hypertension    Past Surgical History:  Procedure Laterality Date  . ABDOMINAL HYSTERECTOMY    . COLONOSCOPY N/A 08/08/2012   RWE:[id number] polyps and internal hemorrhoids.Status post removal rectal polyps  . COLONOSCOPY N/A 04/16/2014   Procedure: COLONOSCOPY;  Surgeon: Christine Dolin, MD;  Location: [facility];  Service: Endoscopy;  Laterality: N/A;  1:30pm  . KNEE ARTHROSCOPY Left    Social History   Social History  . Marital status: Married    Spouse name: N/A  . Number of children: N/A  . Years of education: N/A   Social History Main Topics  . Smoking status: Former Smoker    Packs/day: 0.00    Years: 40.00    Types: Cigarettes  . Smokeless tobacco: Never Used  . Alcohol use No  . Drug use: No  . Sexual activity: Yes    Birth control/ protection: Surgical   Other Topics Concern  . None   Social History Narrative  . None   Outpatient Encounter Prescriptions as of 03/01/2017  Medication Sig  . aspirin 81 MG tablet Take 81 mg by mouth daily.  Marland Kitchen diltiazem (CARDIZEM) 90 MG tablet Take 90 mg by mouth 2 (two) times daily.  Marland Kitchen etodolac (LODINE) 400 MG tablet Take 400 mg by mouth 2 (two) times daily.  Marland Kitchen latanoprost (XALATAN) 0.005 % ophthalmic solution 1 drop at bedtime.  Marland Kitchen levothyroxine (SYNTHROID, LEVOTHROID) 50 MCG tablet Take 1 tablet (50 mcg total) by mouth daily before breakfast.  . lisinopril-hydrochlorothiazide (PRINZIDE,ZESTORETIC) 20-25 MG per tablet Take 1 tablet by mouth daily.  . Multiple Vitamin (MULTIVITAMIN) tablet Take 1 tablet by mouth daily.  . naproxen (NAPROSYN) 500 MG tablet Take 500 mg by mouth 2 (two) times daily with a meal.   No facility-administered encounter medications on file as of 03/01/2017.    ALLERGIES: No Known  Allergies VACCINATION STATUS:  There is no immunization history on file for this patient.  HPI 66 year old female patient with medical history as above. 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 Criss Poole. 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 -  She was given radioactive and therapy on 05/22/2016. - She reports feeling better, no new complaints.  - She has other comorbidities including high blood pressure and high cholesterol , she is not on a beta blocker. - She denies family history of thyroid problem. She denies any neck exposure to radiation.   Review of Systems Constitutional:  She has no new complaints. Eyes: no blurry vision, no xerophthalmia ENT: no sore throat, + nodules palpated in her thyroid, no dysphagia/odynophagia, + occasional hoarseness Cardiovascular: no CP/SOB/palpitations/leg swelling Respiratory: no cough/SOB Gastrointestinal: no N/V/D/C Musculoskeletal: no muscle/joint aches Skin: no rashes Neurological: no tremors/numbness/tingling/dizziness Psychiatric: no depression/anxiety  Objective:    BP (!) 142/91   Pulse 71   Ht 5\' 7"  (1.702 m)   Wt 206 lb (93.4 kg)   BMI 32.26 kg/m   Wt Readings from Last 3 Encounters:  03/01/17 206 lb (93.4 kg)  11/16/16 205 lb (93 kg)  09/14/16 202 lb (91.6 kg)  Physical Exam Constitutional: overweight, in NAD Eyes: PERRLA, EOMI, no exophthalmos ENT: moist mucous membranes, + nodular thyromegaly R>L, no cervical lymphadenopathy Cardiovascular: RRR, No MRG Respiratory: CTA B Gastrointestinal: abdomen soft, NT, ND, BS+ Musculoskeletal: no deformities, strength intact in all 4 Skin: moist, warm, no rashes Neurological: - tremor with outstretched hands, DTR normal on BLE.   Results for Christine, Poole (MRN [id number]) as of 03/01/2017 08:40  Ref. Range 11/09/2016 07:42 02/22/2017  07:28  TSH Latest Ref Range: 0.40 - 4.50 mIU/L <0.01 (L) 0.10 (L)  T4,Free(Direct) Latest Ref Range: 0.8 - 1.8 ng/dL 1.1 0.7 (L)      Assessment & Plan:    1. RAI induced hypothyroidism  2. h/o Toxic multinodul goiter - She is status post RAI thyroid ablation on January12,  2018. - Thyroid function tests are consistent with RAI induced hypothyroidism with free T4 low at 0.7, TSH still suppressed at 0.1. She will benefit from early initiation of hormone replacement therapy. I discussed and initiated levothyroxine 50 g by mouth every morning.  - We discussed about correct intake of levothyroxine, at fasting, with water, separated by at least 30 minutes from breakfast, and separated by more than 4 hours from calcium, iron, multivitamins, acid reflux medications (PPIs). -Patient is made aware of the fact that thyroid hormone replacement is needed for life, dose to be adjusted by periodic monitoring of thyroid function tests.  - She will need surveillance thyroid/neck ultrasound in 1 year.  - I advised patient to maintain close follow up with Christine Lei, MD for primary care needs. Follow up plan: Return in about 3 months (around 06/01/2017) for follow up with pre-visit labs.  Christine Lloyd, MD Phone: [phone]  Fax: [phone]  -  This note was partially dictated with voice recognition software. Similar sounding words can be transcribed inadequately or may not  be corrected upon review.  03/01/2017, 8:45 AM

## 2017-04-28 ENCOUNTER — Other Ambulatory Visit (HOSPITAL_COMMUNITY): Payer: Self-pay | Admitting: Family Medicine

## 2017-04-28 DIAGNOSIS — Z1231 Encounter for screening mammogram for malignant neoplasm of breast: Secondary | ICD-10-CM

## 2017-06-14 LAB — T3, FREE: T3, Free: 3.1 pg/mL (ref 2.3–4.2)

## 2017-06-14 LAB — TSH: TSH: 0.01 mIU/L — ABNORMAL LOW (ref 0.40–4.50)

## 2017-06-14 LAB — T4, FREE: FREE T4: 1.4 ng/dL (ref 0.8–1.8)

## 2017-06-21 ENCOUNTER — Encounter: Payer: Self-pay | Admitting: "Endocrinology

## 2017-06-21 ENCOUNTER — Ambulatory Visit (HOSPITAL_COMMUNITY)
Admission: RE | Admit: 2017-06-21 | Discharge: 2017-06-21 | Disposition: A | Discharge status: Home / Self Care | Payer: BLUE CROSS/BLUE SHIELD | Source: Ambulatory Visit | Attending: Family Medicine | Admitting: Family Medicine

## 2017-06-21 ENCOUNTER — Ambulatory Visit: Payer: BLUE CROSS/BLUE SHIELD | Admitting: "Endocrinology

## 2017-06-21 VITALS — BP 135/86 | HR 88 | Ht 67.0 in | Wt 210.0 lb

## 2017-06-21 DIAGNOSIS — Z1231 Encounter for screening mammogram for malignant neoplasm of breast: Secondary | ICD-10-CM | POA: Diagnosis present

## 2017-06-21 DIAGNOSIS — E0520 Thyrotoxicosis with toxic multinodular goiter without thyrotoxic crisis or storm: Secondary | ICD-10-CM | POA: Diagnosis not present

## 2017-06-21 DIAGNOSIS — E890 Postprocedural hypothyroidism: Secondary | ICD-10-CM

## 2017-06-21 NOTE — Progress Notes (Signed)
Subjective:    Patient ID: Christine Poole, female    DOB: January 20, 1951, PCP Lucianne Lei, MD   Past Medical History:  Diagnosis Date  . Arthritis   . Glaucoma   . Hyperplastic polyp of large intestine   . Hypertension    Past Surgical History:  Procedure Laterality Date  . ABDOMINAL HYSTERECTOMY    . COLONOSCOPY N/A 08/08/2012   LOV:[id number] polyps and internal hemorrhoids.Status post removal rectal polyps  . COLONOSCOPY N/A 04/16/2014   Procedure: COLONOSCOPY;  Surgeon: Daneil Dolin, MD;  Location: [facility];  Service: Endoscopy;  Laterality: N/A;  1:30pm  . KNEE ARTHROSCOPY Left    Social History   Socioeconomic History  . Marital status: Married    Spouse name: None  . Number of children: None  . Years of education: None  . Highest education level: None  Social Needs  . Financial resource strain: None  . Food insecurity - worry: None  . Food insecurity - inability: None  . Transportation needs - medical: None  . Transportation needs - non-medical: None  Occupational History  . None  Tobacco Use  . Smoking status: Former Smoker    Packs/day: 0.00    Years: 40.00    Pack years: 0.00    Types: Cigarettes  . Smokeless tobacco: Never Used  Substance and Sexual Activity  . Alcohol use: No  . Drug use: No  . Sexual activity: Yes    Birth control/protection: Surgical  Other Topics Concern  . None  Social History Narrative  . None   Outpatient Encounter Medications as of 06/21/2017  Medication Sig  . aspirin 81 MG tablet Take 81 mg by mouth daily.  Marland Kitchen diltiazem (CARDIZEM) 90 MG tablet Take 90 mg by mouth 2 (two) times daily.  Marland Kitchen etodolac (LODINE) 400 MG tablet Take 400 mg by mouth 2 (two) times daily.  Marland Kitchen ezetimibe (ZETIA) 10 MG tablet daily.  Marland Kitchen latanoprost (XALATAN) 0.005 % ophthalmic solution 1 drop at bedtime.  Marland Kitchen levothyroxine (SYNTHROID, LEVOTHROID) 50 MCG tablet Take 1 tablet (50 mcg total) by mouth daily before breakfast.  .  lisinopril-hydrochlorothiazide (PRINZIDE,ZESTORETIC) 20-25 MG per tablet Take 1 tablet by mouth daily.  . Multiple Vitamin (MULTIVITAMIN) tablet Take 1 tablet by mouth daily.  . naproxen (NAPROSYN) 500 MG tablet Take 500 mg by mouth 2 (two) times daily with a meal.   No facility-administered encounter medications on file as of 06/21/2017.    ALLERGIES: No Known Allergies VACCINATION STATUS:  There is no immunization history on file for this patient.  HPI 67 year old female patient with medical history as above. She is being seen in f/u  for toxic multinodular goiter status post RAI therapy.    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 -  She was given radioactive and therapy on 05/22/2016 , with subsequent initiation of low-dose levothyroxine for area induced hypothyroidism. - She reports feeling better, no new complaints.  - She has other comorbidities including high blood pressure and high cholesterol , she is not on a beta blocker. - She denies family history of thyroid problem. She denies any neck exposure to radiation.   Review of Systems Constitutional: + Progressive weight gain. Eyes: no blurry vision, no xerophthalmia ENT: no sore throat, + nodules palpated in her thyroid, no dysphagia/odynophagia, + occasional hoarseness Cardiovascular: No chest pain, no palpitations, no leg swelling.  Respiratory: no cough/SOB Gastrointestinal: no N/V/D/C Musculoskeletal: no muscle/joint aches Skin: no rashes Neurological: No tremors, no numbness, no tingling.   Psychiatric: no depression/anxiety  Objective:    BP 135/86   Pulse 88   Ht 5\' 7"  (1.702 m)   Wt 210 lb (95.3 kg)   BMI 32.89 kg/m   Wt Readings from Last 3 Encounters:  06/21/17 210 lb (95.3 kg)  03/01/17 206 lb (93.4 kg)  11/16/16 205 lb (93 kg)    Physical Exam Constitutional: Obese, not in acute distress.   Eyes:  PERRLA, EOMI, no exophthalmos ENT: moist mucous membranes, + nodular thyromegaly R>L, no cervical lymphadenopathy Cardiovascular: RRR, No MRG Respiratory: CTA B Gastrointestinal: abdomen soft, NT, ND, BS+ Musculoskeletal: no deformities, strength intact in all 4 Skin: moist, warm, no rashes Neurological: - tremor with outstretched hands, DTR reflexes normal and bilateral lower extremities.     Results for Christine Poole, Christine Poole (MRN [id number]) as of 06/21/2017 14:55  Ref. Range 02/22/2017 07:28 06/14/2017 07:25  TSH Latest Ref Range: 0.40 - 4.50 mIU/L 0.10 (L) 0.01 (L)  Triiodothyronine,Free,Serum Latest Ref Range: 2.3 - 4.2 pg/mL  3.1  T4,Free(Direct) Latest Ref Range: 0.8 - 1.8 ng/dL 0.7 (L) 1.4      Assessment & Plan:    1. RAI induced hypothyroidism  2. h/o Toxic multinodul goiter - She is status post RAI thyroid ablation on January12,  2018. -Her thyroid function tests are consistent with biochemical response to treatment. -She would benefit from continued levothyroxine at low dose of 50 mcg p.o. every morning. - We discussed about correct intake of levothyroxine, at fasting, with water, separated by at least 30 minutes from breakfast, and separated by more than 4 hours from calcium, iron, multivitamins, acid reflux medications (PPIs). -Patient is made aware of the fact that thyroid hormone replacement is needed for life, dose to be adjusted by periodic monitoring of thyroid function tests.  -Given significant residual goiter, right larger than left lobe, she will need surveillance thyroid/neck ultrasound before her next visit.   - I advised patient to maintain close follow up with Lucianne Lei, MD for primary care needs. Follow up plan: Return in about 3 months (around 09/18/2017) for follow up with pre-visit labs, Thyroid / Neck Ultrasound.  Glade Lloyd, MD Phone: [phone]  Fax: [phone]  -  This note was partially dictated with voice recognition software. Similar  sounding words can be transcribed inadequately or may not  be corrected upon review.  06/21/2017, 2:52 PM

## 2017-06-26 IMAGING — NM NM RAI THERAPY FOR HYPERTHYROIDISM
1 series · 1 of 1 positions shown · non-contrast
Comparison: Thyroid uptake and scan 01/22/2016

CLINICAL DATA: Toxic nodular goiter

EXAM:
RADIOACTIVE IODINE THERAPY FOR HYPERTHYROIDISM
TECHNIQUE: Procedure, benefits and risks of radioactive iodine therapy for
toxic nodular goiter were discussed with the patient, as well as
alternatives.

[Series 1: bone statics · 2.07mm/px · 1 of 1 slices shown]
[im 1/1]
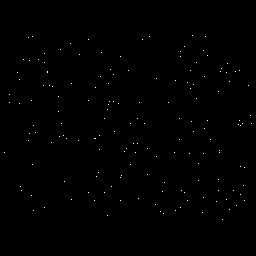

[1 of 1 positions shown; findings below may reference images not displayed]

Radiation safety issues and procedures were discussed, including
minimization of radiation risk to family and general public.

Written safety instructions were provided to the patient.

Patient's questions were answered.

Written informed consent for the therapy was obtained by Dr. My.

Timeout protocol followed.

Patient then received the radiopharmaceutical without immediate
complication.

RADIOPHARMACEUTICALS:  30.7 mCi 2-1G1 sodium iodide orally
IMPRESSION: Per oral administration of 2-1G1 sodium iodide for the treatment of
toxic nodular goiter.

## 2017-06-29 ENCOUNTER — Other Ambulatory Visit: Payer: Self-pay | Admitting: "Endocrinology

## 2017-07-20 IMAGING — MG DIGITAL SCREENING BILATERAL MAMMOGRAM WITH CAD
4 series · 4 of 4 positions shown · non-contrast
Comparison: Previous exam(s).

CLINICAL DATA: Screening.

EXAM:
DIGITAL SCREENING BILATERAL MAMMOGRAM WITH CAD

[R CC]
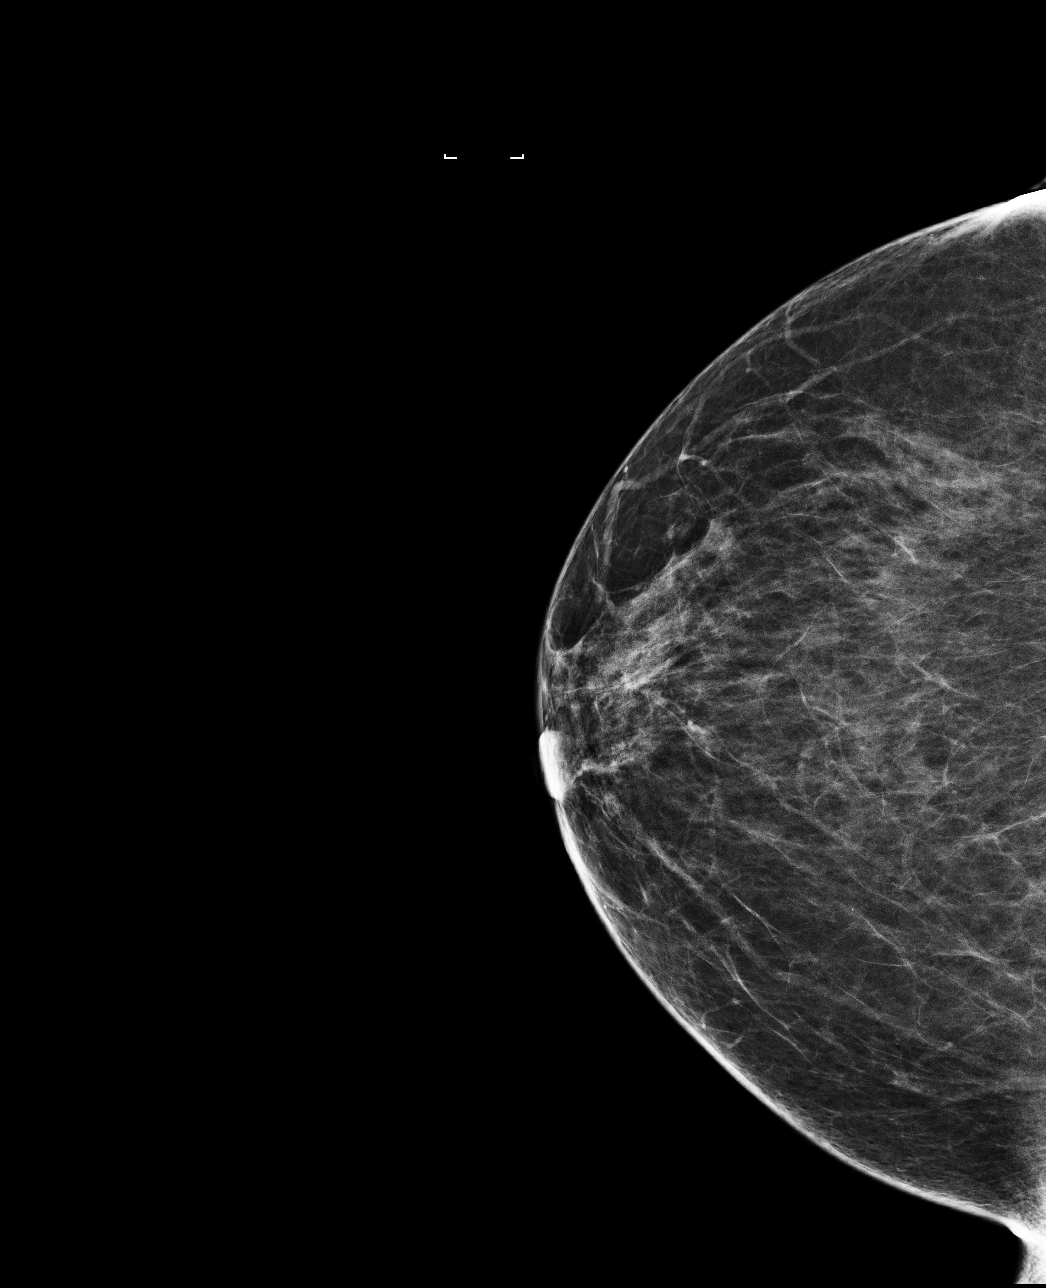

[R MLO]
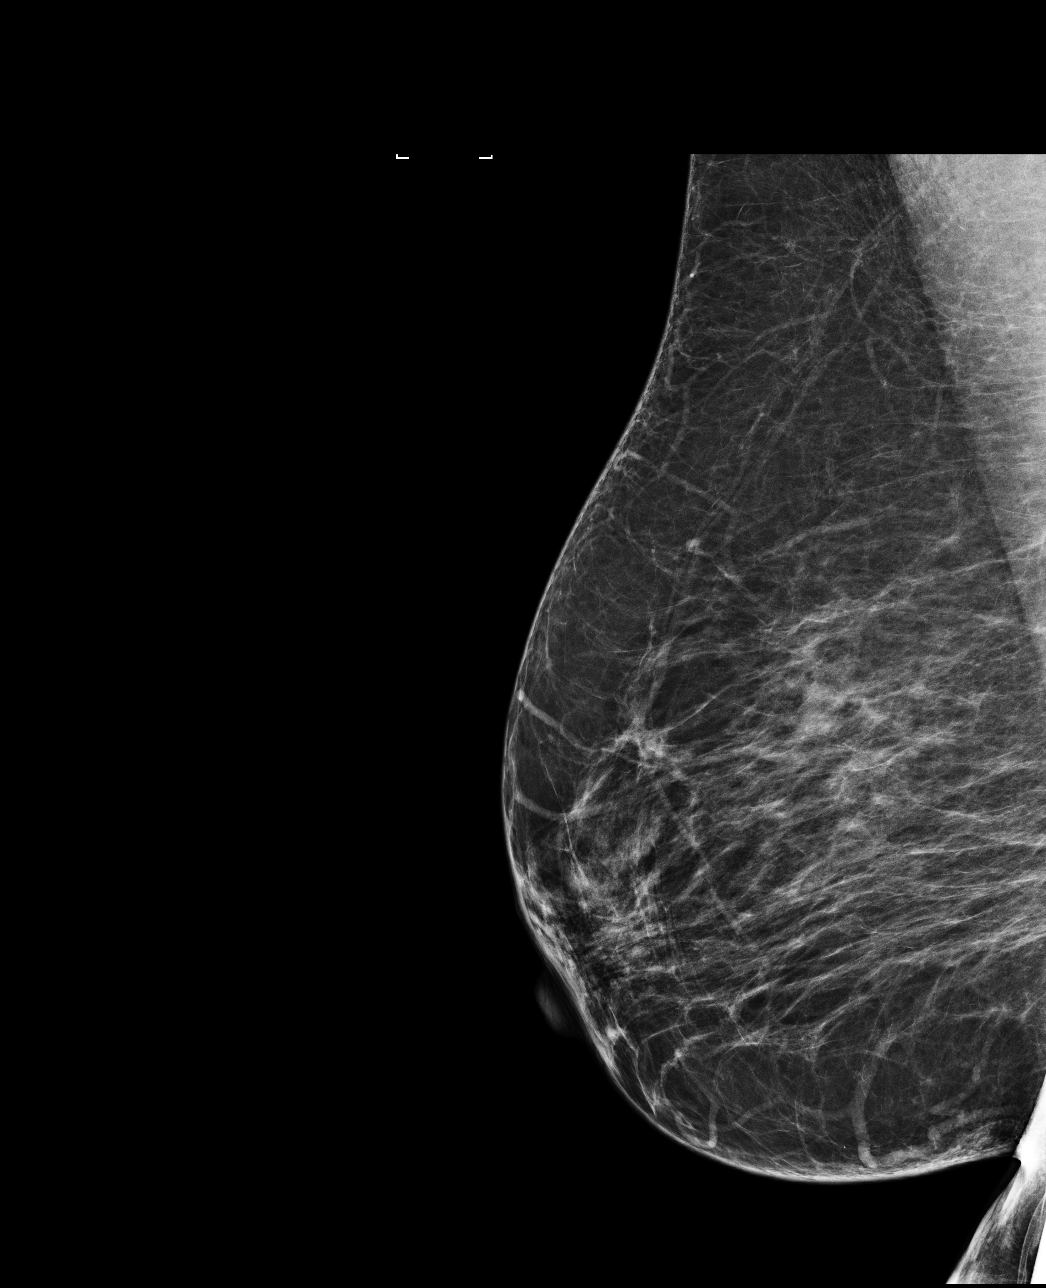

[L MLO]
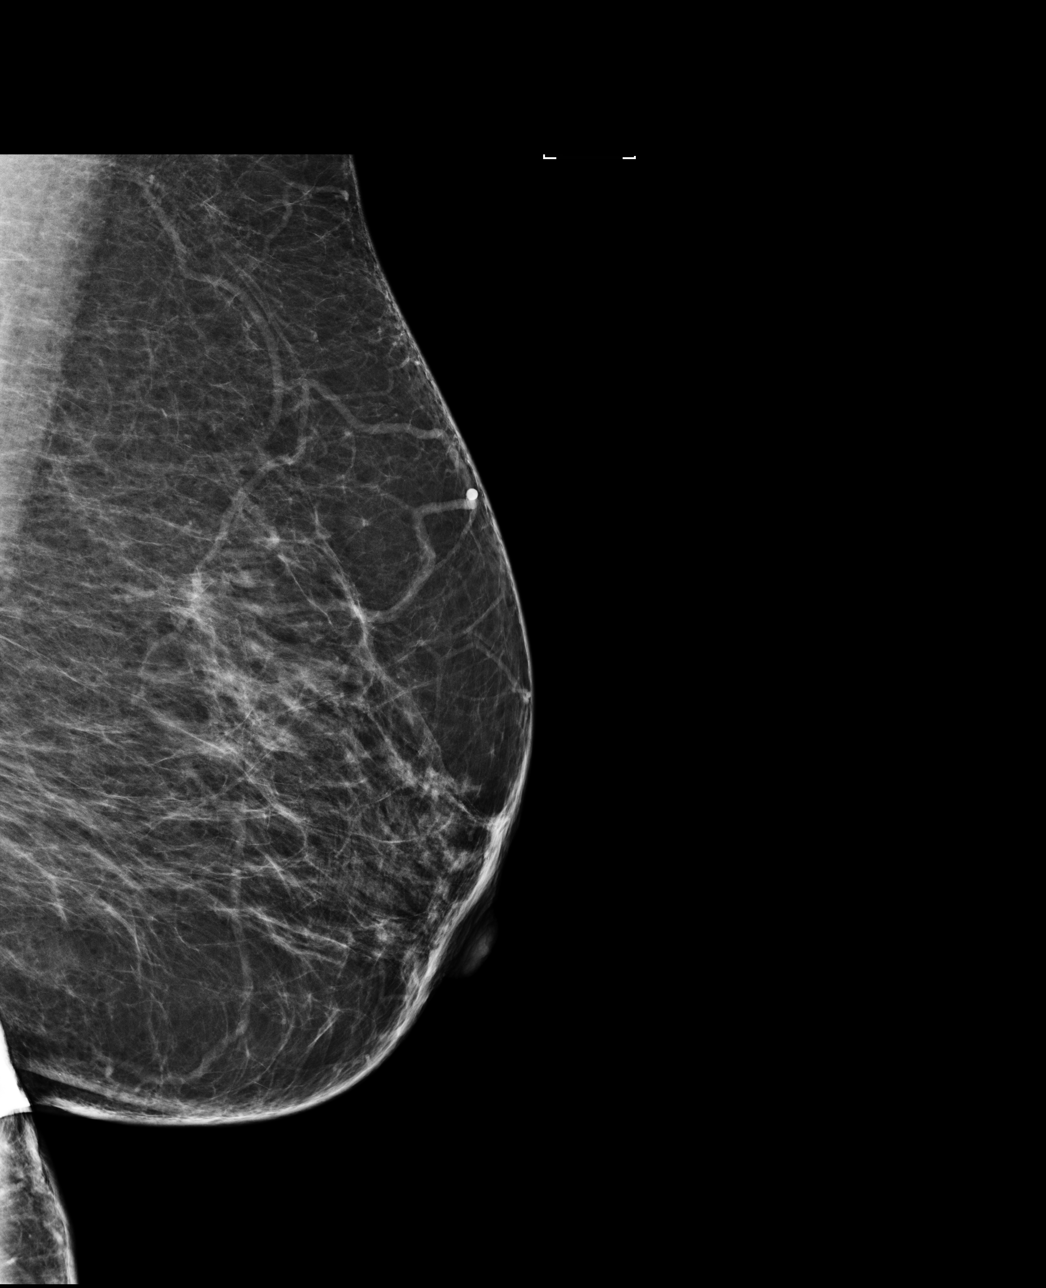

[L CC]
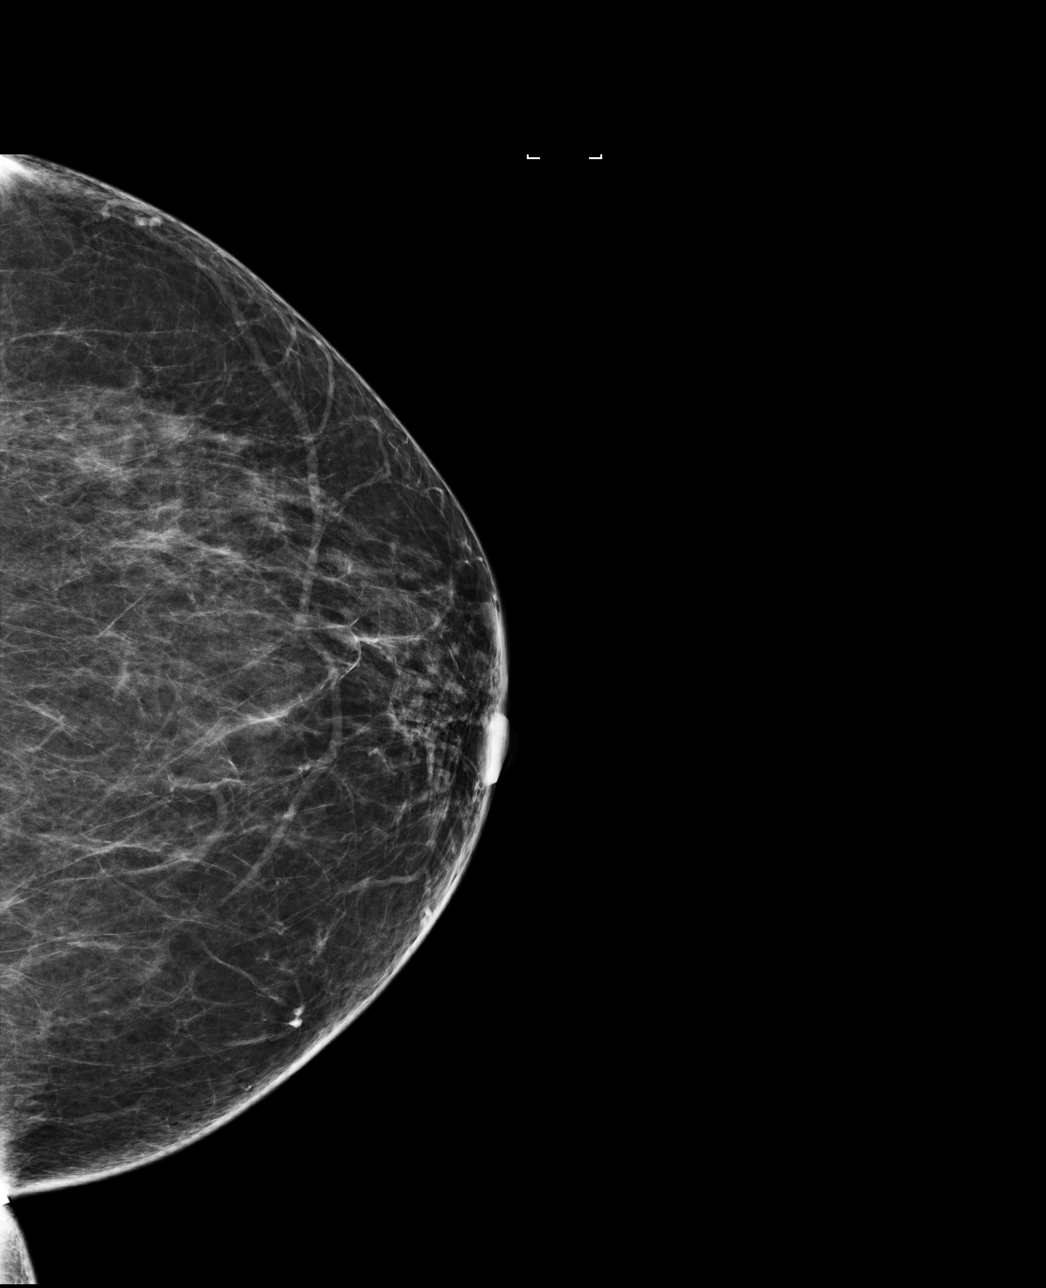

[4 of 4 positions shown; findings below may reference images not displayed]

ACR Breast Density Category b: There are scattered areas of
fibroglandular density.
FINDINGS: There are no findings suspicious for malignancy. Images were
processed with CAD.
IMPRESSION: No mammographic evidence of malignancy. A result letter of this
screening mammogram will be mailed directly to the patient.

RECOMMENDATION:
Screening mammogram in one year. (Code:AS-G-LCT)

BI-RADS CATEGORY  1: Negative.

## 2017-08-30 ENCOUNTER — Ambulatory Visit (HOSPITAL_COMMUNITY)
Admission: RE | Admit: 2017-08-30 | Discharge: 2017-08-30 | Disposition: A | Discharge status: Home / Self Care | Payer: BLUE CROSS/BLUE SHIELD | Source: Ambulatory Visit | Attending: "Endocrinology | Admitting: "Endocrinology

## 2017-08-30 DIAGNOSIS — E0520 Thyrotoxicosis with toxic multinodular goiter without thyrotoxic crisis or storm: Secondary | ICD-10-CM | POA: Diagnosis present

## 2017-09-13 LAB — TSH: TSH: 0.01 m[IU]/L — AB (ref 0.40–4.50)

## 2017-09-13 LAB — T4, FREE: FREE T4: 1.3 ng/dL (ref 0.8–1.8)

## 2017-09-20 ENCOUNTER — Ambulatory Visit: Payer: BLUE CROSS/BLUE SHIELD | Admitting: "Endocrinology

## 2017-09-20 ENCOUNTER — Encounter: Payer: Self-pay | Admitting: "Endocrinology

## 2017-09-20 VITALS — BP 136/82 | HR 72 | Ht 67.0 in | Wt 207.0 lb

## 2017-09-20 DIAGNOSIS — E0520 Thyrotoxicosis with toxic multinodular goiter without thyrotoxic crisis or storm: Secondary | ICD-10-CM | POA: Diagnosis not present

## 2017-09-20 MED ORDER — LEVOTHYROXINE SODIUM 25 MCG PO TABS: ORAL_TABLET | ORAL | 3 refills | Status: DC

## 2017-09-20 NOTE — Progress Notes (Signed)
Subjective:    Patient ID: Christine Poole, female    DOB: 1950/05/29, PCP Lucianne Lei, MD   Past Medical History:  Diagnosis Date  . Arthritis   . Glaucoma   . Hyperplastic polyp of large intestine   . Hypertension    Past Surgical History:  Procedure Laterality Date  . ABDOMINAL HYSTERECTOMY    . COLONOSCOPY N/A 08/08/2012   QQI:WLNLGX polyps and internal hemorrhoids.Status post removal rectal polyps  . COLONOSCOPY N/A 04/16/2014   Procedure: COLONOSCOPY;  Surgeon: Daneil Dolin, MD;  Location: [facility];  Service: Endoscopy;  Laterality: N/A;  1:30pm  . KNEE ARTHROSCOPY Left    Social History   Socioeconomic History  . Marital status: Married    Spouse name: Not on file  . Number of children: Not on file  . Years of education: Not on file  . Highest education level: Not on file  Occupational History  . Not on file  Social Needs  . Financial resource strain: Not on file  . Food insecurity:    Worry: Not on file    Inability: Not on file  . Transportation needs:    Medical: Not on file    Non-medical: Not on file  Tobacco Use  . Smoking status: Former Smoker    Packs/day: 0.00    Years: 40.00    Pack years: 0.00    Types: Cigarettes  . Smokeless tobacco: Never Used  Substance and Sexual Activity  . Alcohol use: No  . Drug use: No  . Sexual activity: Yes    Birth control/protection: Surgical  Lifestyle  . Physical activity:    Days per week: Not on file    Minutes per session: Not on file  . Stress: Not on file  Relationships  . Social connections:    Talks on phone: Not on file    Gets together: Not on file    Attends religious service: Not on file    Active member of club or organization: Not on file    Attends meetings of clubs or organizations: Not on file    Relationship status: Not on file  Other Topics Concern  . Not on file  Social History Narrative  . Not on file   Outpatient Encounter Medications as of 09/20/2017  Medication  Sig  . aspirin 81 MG tablet Take 81 mg by mouth daily.  Marland Kitchen diltiazem (CARDIZEM) 90 MG tablet Take 90 mg by mouth 2 (two) times daily.  Marland Kitchen etodolac (LODINE) 400 MG tablet Take 400 mg by mouth 2 (two) times daily.  Marland Kitchen ezetimibe (ZETIA) 10 MG tablet daily.  Marland Kitchen latanoprost (XALATAN) 0.005 % ophthalmic solution 1 drop at bedtime.  Marland Kitchen levothyroxine (SYNTHROID, LEVOTHROID) 25 MCG tablet TAKE 1 TABLET(50 MCG) BY MOUTH DAILY BEFORE BREAKFAST  . lisinopril-hydrochlorothiazide (PRINZIDE,ZESTORETIC) 20-25 MG per tablet Take 1 tablet by mouth daily.  . Multiple Vitamin (MULTIVITAMIN) tablet Take 1 tablet by mouth daily.  . naproxen (NAPROSYN) 500 MG tablet Take 500 mg by mouth 2 (two) times daily with a meal.  . [DISCONTINUED] levothyroxine (SYNTHROID, LEVOTHROID) 50 MCG tablet TAKE 1 TABLET(50 MCG) BY MOUTH DAILY BEFORE BREAKFAST   No facility-administered encounter medications on file as of 09/20/2017.    ALLERGIES: No Known Allergies VACCINATION STATUS:  There is no immunization history on file for this patient.  HPI 67 year old female patient with medical history as above. She is being seen in f/u  for toxic multinodular goiter status post RAI therapy.  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  She is currently on levothyroxine 50 mcg p.o. nightly.  She reports compliance. - She reports feeling better, no new complaints.  - She has other comorbidities including high blood pressure and high cholesterol , she is not on a beta blocker. - She denies family history of thyroid problem. She denies any neck exposure to radiation.   Review of Systems Constitutional: + Minimally fluctuating body weight.     Eyes: no blurry vision, no xerophthalmia ENT: no sore  throat, + nodules palpated in her thyroid, no dysphagia/odynophagia, + occasional hoarseness Cardiovascular: No chest pain, no palpitations, no leg swelling.   Respiratory: no cough/SOB Gastrointestinal: no N/V/D/C Musculoskeletal: no muscle/joint aches Skin: no rashes Neurological: No tremors, no numbness, no tingling.   Psychiatric: no depression/anxiety  Objective:    BP 136/82   Pulse 72   Ht 5\' 7"  (1.702 m)   Wt 207 lb (93.9 kg)   BMI 32.42 kg/m   Wt Readings from Last 3 Encounters:  09/20/17 207 lb (93.9 kg)  06/21/17 210 lb (95.3 kg)  03/01/17 206 lb (93.4 kg)    Physical Exam Constitutional: Obese, not in acute distress.   Eyes: PERRLA, EOMI, no exophthalmos ENT: moist mucous membranes, + nodular thyromegaly R>L, no cervical lymphadenopathy Cardiovascular: RRR, No MRG Respiratory: CTA B Gastrointestinal: abdomen soft, NT, ND, BS+ Musculoskeletal: no deformities, strength intact in all 4 Skin: moist, warm, no rashes Neurological: - tremor with outstretched hands, DTR reflexes normal and bilateral lower extremities.     Results for SIEARA, BREMER (MRN [id number]) as of 09/20/2017 09:07  Ref. Range 06/14/2017 07:25 09/13/2017 07:21  TSH Latest Ref Range: 0.40 - 4.50 mIU/L 0.01 (L) 0.01 (L)  Triiodothyronine,Free,Serum Latest Ref Range: 2.3 - 4.2 pg/mL 3.1   T4,Free(Direct) Latest Ref Range: 0.8 - 1.8 ng/dL 1.4 1.3    Thyroid ultrasound from August 30, 2017 IMPRESSION: Right nodule 2 is stable and continues to meet criteria for fine needle aspiration biopsy.  Left nodule 3 is stable in size but has increased in TIRADS category. It meets criteria for fine needle aspiration biopsy.  Left lower nodule 4 has significantly increased in size and meets criteria for fine needle aspiration biopsy.  Assessment & Plan:    1. RAI induced hypothyroidism  2. h/o Toxic multinodul goiter - She is status post RAI thyroid ablation on January12,  2018. -Her thyroid  function tests are consistent with slight over replacement.  I discussed and lowered her levothyroxine to 25 mcg p.o. nightly.  It is possible that her thyroid is still active and contributing with thyroid hormone secretion.    - We discussed about correct intake of levothyroxine, at fasting, with water, separated by at least 30 minutes from breakfast, and separated by more than 4 hours from calcium, iron, multivitamins, acid reflux medications (PPIs). -Patient is made aware of the fact that thyroid hormone replacement is needed for life, dose to be adjusted by periodic monitoring of thyroid function tests.  -Given her significant clinical goiter and radiology studies significant nodules, 3 of them, I approached her for fine-needle aspiration and she agrees.   -On prior encounter, thyroid uptake and scan did not reveal cold nodules in the thyroid, rather was significant for  toxic multinodular goiter.     - I advised patient to maintain close follow up with Lucianne Lei, MD for primary care needs. Follow up plan: Return in about 3 weeks (around 10/11/2017) for follow up with biopsy results.  Glade Lloyd, MD Phone: [phone]  Fax: [phone]  -  This note was partially dictated with voice recognition software. Similar sounding words can be transcribed inadequately or may not  be corrected upon review.  09/20/2017, 9:40 AM

## 2017-09-23 ENCOUNTER — Ambulatory Visit (HOSPITAL_COMMUNITY)
Admission: RE | Admit: 2017-09-23 | Discharge: 2017-09-23 | Disposition: A | Discharge status: Home / Self Care | Payer: BLUE CROSS/BLUE SHIELD | Source: Ambulatory Visit | Attending: "Endocrinology | Admitting: "Endocrinology

## 2017-09-23 ENCOUNTER — Other Ambulatory Visit: Payer: Self-pay | Admitting: "Endocrinology

## 2017-09-23 ENCOUNTER — Encounter (HOSPITAL_COMMUNITY): Payer: Self-pay

## 2017-09-23 DIAGNOSIS — E042 Nontoxic multinodular goiter: Secondary | ICD-10-CM

## 2017-09-23 DIAGNOSIS — E0520 Thyrotoxicosis with toxic multinodular goiter without thyrotoxic crisis or storm: Secondary | ICD-10-CM

## 2017-09-23 MED ORDER — LIDOCAINE HCL (PF) 2 % IJ SOLN
INTRAMUSCULAR | Status: AC
  Administered 2017-09-23: 10 mL
  Filled 2017-09-23: qty 30

## 2017-09-29 ENCOUNTER — Ambulatory Visit (HOSPITAL_COMMUNITY): Admission: RE | Admit: 2017-09-29 | Payer: BLUE CROSS/BLUE SHIELD | Source: Ambulatory Visit

## 2017-10-13 ENCOUNTER — Encounter: Payer: Self-pay | Admitting: "Endocrinology

## 2017-10-13 ENCOUNTER — Ambulatory Visit: Payer: BLUE CROSS/BLUE SHIELD | Admitting: "Endocrinology

## 2017-10-13 ENCOUNTER — Other Ambulatory Visit: Payer: Self-pay

## 2017-10-13 VITALS — BP 127/78 | HR 86 | Ht 67.0 in | Wt 207.0 lb

## 2017-10-13 DIAGNOSIS — E890 Postprocedural hypothyroidism: Secondary | ICD-10-CM

## 2017-10-13 DIAGNOSIS — E042 Nontoxic multinodular goiter: Secondary | ICD-10-CM | POA: Diagnosis not present

## 2017-10-13 MED ORDER — LEVOTHYROXINE SODIUM 25 MCG PO TABS: ORAL_TABLET | ORAL | 3 refills | Status: DC

## 2017-10-13 NOTE — Progress Notes (Signed)
Subjective:    Patient ID: Christine Poole, female    DOB: 04-Nov-1950, PCP Lucianne Lei, MD   Past Medical History:  Diagnosis Date  . Arthritis   . Glaucoma   . Hyperplastic polyp of large intestine   . Hypertension    Past Surgical History:  Procedure Laterality Date  . ABDOMINAL HYSTERECTOMY    . COLONOSCOPY N/A 08/08/2012   [id number] polyps and internal hemorrhoids.Status post removal rectal polyps  . COLONOSCOPY N/A 04/16/2014   Procedure: COLONOSCOPY;  Surgeon: Daneil Dolin, MD;  Location: [facility];  Service: Endoscopy;  Laterality: N/A;  1:30pm  . KNEE ARTHROSCOPY Left    Social History   Socioeconomic History  . Marital status: Married    Spouse name: Not on file  . Number of children: Not on file  . Years of education: Not on file  . Highest education level: Not on file  Occupational History  . Not on file  Social Needs  . Financial resource strain: Not on file  . Food insecurity:    Worry: Not on file    Inability: Not on file  . Transportation needs:    Medical: Not on file    Non-medical: Not on file  Tobacco Use  . Smoking status: Former Smoker    Packs/day: 0.00    Years: 40.00    Pack years: 0.00    Types: Cigarettes  . Smokeless tobacco: Never Used  Substance and Sexual Activity  . Alcohol use: No  . Drug use: No  . Sexual activity: Yes    Birth control/protection: Surgical  Lifestyle  . Physical activity:    Days per week: Not on file    Minutes per session: Not on file  . Stress: Not on file  Relationships  . Social connections:    Talks on phone: Not on file    Gets together: Not on file    Attends religious service: Not on file    Active member of club or organization: Not on file    Attends meetings of clubs or organizations: Not on file    Relationship status: Not on file  Other Topics Concern  . Not on file  Social History Narrative  . Not on file   Outpatient Encounter Medications as of 10/13/2017  Medication  Sig  . aspirin 81 MG tablet Take 81 mg by mouth daily.  Marland Kitchen diltiazem (CARDIZEM) 90 MG tablet Take 90 mg by mouth 2 (two) times daily.  Marland Kitchen etodolac (LODINE) 400 MG tablet Take 400 mg by mouth 2 (two) times daily.  Marland Kitchen ezetimibe (ZETIA) 10 MG tablet daily.  Marland Kitchen latanoprost (XALATAN) 0.005 % ophthalmic solution 1 drop at bedtime.  Marland Kitchen levothyroxine (SYNTHROID, LEVOTHROID) 25 MCG tablet TAKE 1 TABLET(50 MCG) BY MOUTH DAILY BEFORE BREAKFAST  . lisinopril-hydrochlorothiazide (PRINZIDE,ZESTORETIC) 20-25 MG per tablet Take 1 tablet by mouth daily.  . Multiple Vitamin (MULTIVITAMIN) tablet Take 1 tablet by mouth daily.  . naproxen (NAPROSYN) 500 MG tablet Take 500 mg by mouth 2 (two) times daily with a meal.   No facility-administered encounter medications on file as of 10/13/2017.    ALLERGIES: No Known Allergies VACCINATION STATUS:  There is no immunization history on file for this patient.  HPI 67 year old female patient with medical history as above. She is being seen in up with fine-needle aspiration biopsy results.  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  -She was offered FNA for persistent multinodular goiter-benign  biopsy results.   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  She is currently on levothyroxine 25 mcg p.o. nightly.  She reports compliance. - She reports feeling better, no new complaints.  - She has other comorbidities including high blood pressure and high cholesterol , she is not on a beta blocker. - She denies family history of thyroid problem. She denies any neck exposure to radiation.   Review of Systems Constitutional: + Minimally fluctuating body weight.     Eyes: no blurry vision, no  xerophthalmia ENT: no sore throat, + nodules palpated in her thyroid, no dysphagia/odynophagia, + occasional hoarseness Cardiovascular: No chest pain, no palpitations, no leg swelling.   Respiratory: no cough/SOB Gastrointestinal: no N/V/D/C Musculoskeletal: no muscle/joint aches Skin: no rashes Neurological: No tremors, no numbness, no tingling.   Psychiatric: no depression/anxiety  Objective:    BP 127/78   Pulse 86   Ht 5\' 7"  (1.702 m)   Wt 207 lb (93.9 kg)   BMI 32.42 kg/m   Wt Readings from Last 3 Encounters:  10/13/17 207 lb (93.9 kg)  09/20/17 207 lb (93.9 kg)  06/21/17 210 lb (95.3 kg)    Physical Exam Constitutional: Obese, not in acute distress.   Eyes: PERRLA, EOMI, no exophthalmos ENT: moist mucous membranes, + nodular thyromegaly R>L, no cervical lymphadenopathy Cardiovascular: RRR, No MRG Respiratory: CTA B Gastrointestinal: abdomen soft, NT, ND, BS+ Musculoskeletal: no deformities, strength intact in all 4 Skin: moist, warm, no rashes Neurological: - tremor with outstretched hands, DTR reflexes normal and bilateral lower extremities.     Results for JACKELYN, ILLINGWORTH (MRN [id number]) as of 09/20/2017 09:07  Ref. Range 06/14/2017 07:25 09/13/2017 07:21  TSH Latest Ref Range: 0.40 - 4.50 mIU/L 0.01 (L) 0.01 (L)  Triiodothyronine,Free,Serum Latest Ref Range: 2.3 - 4.2 pg/mL 3.1   T4,Free(Direct) Latest Ref Range: 0.8 - 1.8 ng/dL 1.4 1.3      Thyroid ultrasound from August 30, 2017 IMPRESSION: Right nodule 2 is stable and continues to meet criteria for fine needle aspiration biopsy.  Left nodule 3 is stable in size but has increased in TIRADS category. It meets criteria for fine needle aspiration biopsy. Left lower nodule 4 has significantly increased in size and meets criteria for fine needle aspiration biopsy.  3 nodules on the left lobe of her thyroid was biopsied with benign findings.  Assessment & Plan:    1. RAI induced hypothyroidism  2.  h/o Toxic multinodul goiter - She is status post RAI thyroid ablation on January12,  2018. -Her thyroid function tests are consistent with slight over replacement.  She was continued on her lower dose of levothyroxine at 25 mcg p.o. every morning during her last visit.    It is possible that her thyroid is still active and contributing with thyroid hormone secretion.     - We discussed about correct intake of levothyroxine, at fasting, with water, separated by at least 30 minutes from breakfast, and separated by more than 4 hours from calcium, iron, multivitamins, acid reflux medications (PPIs). -Patient is made aware of the fact that thyroid hormone replacement is needed for life, dose to be adjusted by periodic monitoring of thyroid function tests.  -Fine-needle aspiration of 3 nodules on the left lobe is negative for  malignancy.   -She will not require active surgical intervention at this time.   -She will be considered for follow-up surveillance ultrasound in 1 to 2 years time.  - I advised patient to maintain close follow up with Lucianne Lei, MD for primary care needs. Follow up plan: Return in about 4 months (around 02/12/2018) for follow up with pre-visit labs.  Glade Lloyd, MD Phone: [phone]  Fax: [phone]  -  This note was partially dictated with voice recognition software. Similar sounding words can be transcribed inadequately or may not  be corrected upon review.  10/13/2017, 11:16 AM

## 2017-10-21 ENCOUNTER — Other Ambulatory Visit: Payer: Self-pay

## 2017-10-21 MED ORDER — LEVOTHYROXINE SODIUM 25 MCG PO TABS: ORAL_TABLET | ORAL | 3 refills | Status: DC

## 2017-10-25 ENCOUNTER — Ambulatory Visit: Payer: BLUE CROSS/BLUE SHIELD | Admitting: "Endocrinology

## 2017-11-25 ENCOUNTER — Other Ambulatory Visit (HOSPITAL_COMMUNITY): Payer: Self-pay | Admitting: Family Medicine

## 2017-11-25 DIAGNOSIS — Z1231 Encounter for screening mammogram for malignant neoplasm of breast: Secondary | ICD-10-CM

## 2017-12-13 ENCOUNTER — Ambulatory Visit: Payer: BLUE CROSS/BLUE SHIELD | Admitting: "Endocrinology

## 2018-02-07 LAB — T4, FREE: Free T4: 1.2 ng/dL (ref 0.8–1.8)

## 2018-02-07 LAB — TSH: TSH: 0.15 m[IU]/L — AB (ref 0.40–4.50)

## 2018-02-14 ENCOUNTER — Encounter: Payer: Self-pay | Admitting: "Endocrinology

## 2018-02-14 ENCOUNTER — Ambulatory Visit: Payer: BLUE CROSS/BLUE SHIELD | Admitting: "Endocrinology

## 2018-02-14 VITALS — BP 139/85 | HR 73 | Ht 67.0 in | Wt 208.0 lb

## 2018-02-14 DIAGNOSIS — E890 Postprocedural hypothyroidism: Secondary | ICD-10-CM

## 2018-02-14 MED ORDER — LEVOTHYROXINE SODIUM 25 MCG PO TABS: 25.0000 ug | ORAL_TABLET | Freq: Every day | ORAL | 1 refills | Status: DC

## 2018-02-14 NOTE — Progress Notes (Signed)
Endocrinology follow-up note  Subjective:    Patient ID: Christine Poole, female    DOB: 08-13-1950, PCP Lucianne Lei, MD   Past Medical History:  Diagnosis Date  . Arthritis   . Glaucoma   . Hyperplastic polyp of large intestine   . Hypertension    Past Surgical History:  Procedure Laterality Date  . ABDOMINAL HYSTERECTOMY    . COLONOSCOPY N/A 08/08/2012   [id number] polyps and internal hemorrhoids.Status post removal rectal polyps  . COLONOSCOPY N/A 04/16/2014   Procedure: COLONOSCOPY;  Surgeon: Daneil Dolin, MD;  Location: [facility];  Service: Endoscopy;  Laterality: N/A;  1:30pm  . KNEE ARTHROSCOPY Left    Social History   Socioeconomic History  . Marital status: Married    Spouse name: Not on file  . Number of children: Not on file  . Years of education: Not on file  . Highest education level: Not on file  Occupational History  . Not on file  Social Needs  . Financial resource strain: Not on file  . Food insecurity:    Worry: Not on file    Inability: Not on file  . Transportation needs:    Medical: Not on file    Non-medical: Not on file  Tobacco Use  . Smoking status: Former Smoker    Packs/day: 0.00    Years: 40.00    Pack years: 0.00    Types: Cigarettes  . Smokeless tobacco: Never Used  Substance and Sexual Activity  . Alcohol use: No  . Drug use: No  . Sexual activity: Yes    Birth control/protection: Surgical  Lifestyle  . Physical activity:    Days per week: Not on file    Minutes per session: Not on file  . Stress: Not on file  Relationships  . Social connections:    Talks on phone: Not on file    Gets together: Not on file    Attends religious service: Not on file    Active member of club or organization: Not on file    Attends meetings of clubs or organizations: Not on file    Relationship status: Not on file  Other Topics Concern  . Not on file  Social History Narrative  . Not on file   Outpatient Encounter  Medications as of 02/14/2018  Medication Sig  . levothyroxine (SYNTHROID, LEVOTHROID) 25 MCG tablet Take 1 tablet (25 mcg total) by mouth daily before breakfast.  . [DISCONTINUED] levothyroxine (SYNTHROID, LEVOTHROID) 25 MCG tablet Take 25 mcg by mouth daily before breakfast.  . aspirin 81 MG tablet Take 81 mg by mouth daily.  Marland Kitchen diltiazem (CARDIZEM) 90 MG tablet Take 90 mg by mouth 2 (two) times daily.  Marland Kitchen etodolac (LODINE) 400 MG tablet Take 400 mg by mouth 2 (two) times daily.  Marland Kitchen ezetimibe (ZETIA) 10 MG tablet daily.  Marland Kitchen latanoprost (XALATAN) 0.005 % ophthalmic solution 1 drop at bedtime.  Marland Kitchen lisinopril-hydrochlorothiazide (PRINZIDE,ZESTORETIC) 20-25 MG per tablet Take 1 tablet by mouth daily.  . Multiple Vitamin (MULTIVITAMIN) tablet Take 1 tablet by mouth daily.  . naproxen (NAPROSYN) 500 MG tablet Take 500 mg by mouth 2 (two) times daily with a meal.  . [DISCONTINUED] levothyroxine (SYNTHROID, LEVOTHROID) 25 MCG tablet TAKE 1 TABLET(50 MCG) BY MOUTH DAILY BEFORE BREAKFAST  . [DISCONTINUED] levothyroxine (SYNTHROID, LEVOTHROID) 50 MCG tablet Take 50 mcg by mouth daily before breakfast.   No facility-administered encounter medications on file as of 02/14/2018.    ALLERGIES: No  Known Allergies VACCINATION STATUS:  There is no immunization history on file for this patient.  HPI 67year-old female patient with medical history as above. She is being seen in follow-up status post radioactive iodine therapy on May 22, 2016.  She is on low-dose levothyroxine 25 mcg p.o.  every morning.   -Her recent fine-needle aspiration was negative for malignancy.  -She reports compliance. - She reports feeling better, no new complaints.  - She has other comorbidities including high blood pressure and high cholesterol , she is not on a beta blocker. - She denies family history of thyroid problem. She denies any neck exposure to radiation.   Review of Systems Constitutional: + Minimally fluctuating body  weight.     Eyes: no blurry vision, no xerophthalmia ENT: no sore throat, + nodules palpated in her thyroid, no dysphagia/odynophagia, + occasional hoarseness Cardiovascular: No chest pain, no palpitations, no leg swelling.    Musculoskeletal: no muscle/joint aches Skin: no rashes Neurological: No tremors, no numbness, no tingling.   Psychiatric: no depression/anxiety  Objective:    BP 139/85   Pulse 73   Ht 5\' 7"  (1.702 m)   Wt 208 lb (94.3 kg)   BMI 32.58 kg/m   Wt Readings from Last 3 Encounters:  02/14/18 208 lb (94.3 kg)  10/13/17 207 lb (93.9 kg)  09/20/17 207 lb (93.9 kg)    Physical Exam Constitutional: Obese, not in acute distress.   Eyes: PERRLA, EOMI, no exophthalmos ENT: moist mucous membranes, + nodular thyromegaly R>L, no cervical lymphadenopathy Cardiovascular: RRR, No MRG Respiratory: CTA B Gastrointestinal: abdomen soft, NT, ND, BS+ Musculoskeletal: no deformities, strength intact in all 4 Skin: moist, warm, no rashes Neurological: - tremor with outstretched hands, DTR reflexes normal and bilateral lower extremities.    Results for ISELLA, SLATTEN (MRN [id number]) as of 02/14/2018 16:41  Ref. Range 09/13/2017 07:21 02/07/2018 07:20  TSH Latest Ref Range: 0.40 - 4.50 mIU/L 0.01 (L) 0.15 (L)  T4,Free(Direct) Latest Ref Range: 0.8 - 1.8 ng/dL 1.3 1.2     Thyroid ultrasound from August 30, 2017 IMPRESSION: Right nodule 2 is stable and continues to meet criteria for fine needle aspiration biopsy.  Left nodule 3 is stable in size but has increased in TIRADS category. It meets criteria for fine needle aspiration biopsy. Left lower nodule 4 has significantly increased in size and meets criteria for fine needle aspiration biopsy.  3 nodules on the left lobe of her thyroid was biopsied with benign findings.  Assessment & Plan:    1. RAI induced hypothyroidism  2. h/o Toxic multinodul goiter - She is status post RAI thyroid ablation on January12,   2018. -Her thyroid function tests are consistent with appropriate replacement with low dose of levothyroxine at 25 mcg p.o. every morning.    It is possible that her thyroid is still active and contributing with thyroid hormone secretion.   -I advised her to continue on the same dose of levothyroxine currently at 25 mcg.   - We discussed about correct intake of levothyroxine, at fasting, with water, separated by at least 30 minutes from breakfast, and separated by more than 4 hours from calcium, iron, multivitamins, acid reflux medications (PPIs). -Patient is made aware of the fact that thyroid hormone replacement is needed for life, dose to be adjusted by periodic monitoring of thyroid function tests.    -Fine-needle aspiration of 3 nodules on the left lobe is negative for malignancy.   -She will not require active surgical intervention  at this time.   -She will be considered for follow-up surveillance ultrasound in 1  Year.  - I advised patient to maintain close follow up with Lucianne Lei, MD for primary care needs. Follow up plan: Return in about 6 months (around 08/16/2018) for Follow up with Pre-visit Labs.  Glade Lloyd, MD Phone: [phone]  Fax: [phone]  -  This note was partially dictated with voice recognition software. Similar sounding words can be transcribed inadequately or may not  be corrected upon review.  02/14/2018, 4:37 PM

## 2018-06-22 ENCOUNTER — Ambulatory Visit (HOSPITAL_COMMUNITY)
Admission: RE | Admit: 2018-06-22 | Discharge: 2018-06-22 | Disposition: A | Discharge status: Home / Self Care | Payer: BLUE CROSS/BLUE SHIELD | Source: Ambulatory Visit | Attending: Family Medicine | Admitting: Family Medicine

## 2018-06-22 DIAGNOSIS — Z1231 Encounter for screening mammogram for malignant neoplasm of breast: Secondary | ICD-10-CM | POA: Insufficient documentation

## 2018-08-15 LAB — T4, FREE: FREE T4: 1.3 ng/dL (ref 0.8–1.8)

## 2018-08-15 LAB — TSH: TSH: 0.41 m[IU]/L (ref 0.40–4.50)

## 2018-08-22 ENCOUNTER — Other Ambulatory Visit: Payer: Self-pay

## 2018-08-22 ENCOUNTER — Ambulatory Visit (INDEPENDENT_AMBULATORY_CARE_PROVIDER_SITE_OTHER): Payer: BLUE CROSS/BLUE SHIELD | Admitting: "Endocrinology

## 2018-08-22 ENCOUNTER — Encounter: Payer: Self-pay | Admitting: "Endocrinology

## 2018-08-22 DIAGNOSIS — E890 Postprocedural hypothyroidism: Secondary | ICD-10-CM

## 2018-08-22 DIAGNOSIS — E042 Nontoxic multinodular goiter: Secondary | ICD-10-CM | POA: Diagnosis not present

## 2018-08-22 MED ORDER — LEVOTHYROXINE SODIUM 25 MCG PO TABS: 25.0000 ug | ORAL_TABLET | Freq: Every day | ORAL | 1 refills | Status: DC

## 2018-08-22 NOTE — Progress Notes (Signed)
Endocrinology Telephone Visit Follow up Note -During COVID -19 Pandemic   Subjective:    Patient ID: Christine Poole, female    DOB: November 21, 1950, PCP Lucianne Lei, MD   Past Medical History:  Diagnosis Date  . Arthritis   . Glaucoma   . Hyperplastic polyp of large intestine   . Hypertension    Past Surgical History:  Procedure Laterality Date  . ABDOMINAL HYSTERECTOMY    . COLONOSCOPY N/A 08/08/2012   [id number] polyps and internal hemorrhoids.Status post removal rectal polyps  . COLONOSCOPY N/A 04/16/2014   Procedure: COLONOSCOPY;  Surgeon: Daneil Dolin, MD;  Location: [facility];  Service: Endoscopy;  Laterality: N/A;  1:30pm  . KNEE ARTHROSCOPY Left    Social History   Socioeconomic History  . Marital status: Married    Spouse name: Not on file  . Number of children: Not on file  . Years of education: Not on file  . Highest education level: Not on file  Occupational History  . Not on file  Social Needs  . Financial resource strain: Not on file  . Food insecurity:    Worry: Not on file    Inability: Not on file  . Transportation needs:    Medical: Not on file    Non-medical: Not on file  Tobacco Use  . Smoking status: Former Smoker    Packs/day: 0.00    Years: 40.00    Pack years: 0.00    Types: Cigarettes  . Smokeless tobacco: Never Used  Substance and Sexual Activity  . Alcohol use: No  . Drug use: No  . Sexual activity: Yes    Birth control/protection: Surgical  Lifestyle  . Physical activity:    Days per week: Not on file    Minutes per session: Not on file  . Stress: Not on file  Relationships  . Social connections:    Talks on phone: Not on file    Gets together: Not on file    Attends religious service: Not on file    Active member of club or organization: Not on file    Attends meetings of clubs or organizations: Not on file    Relationship status: Not on file  Other Topics  Concern  . Not on file  Social History Narrative  . Not on file   Outpatient Encounter Medications as of 08/22/2018  Medication Sig  . aspirin 81 MG tablet Take 81 mg by mouth daily.  Marland Kitchen diltiazem (CARDIZEM) 90 MG tablet Take 90 mg by mouth 2 (two) times daily.  Marland Kitchen etodolac (LODINE) 400 MG tablet Take 400 mg by mouth 2 (two) times daily.  Marland Kitchen ezetimibe (ZETIA) 10 MG tablet daily.  Marland Kitchen latanoprost (XALATAN) 0.005 % ophthalmic solution 1 drop at bedtime.  Marland Kitchen levothyroxine (SYNTHROID, LEVOTHROID) 25 MCG tablet Take 1 tablet (25 mcg total) by mouth daily before breakfast.  . lisinopril-hydrochlorothiazide (PRINZIDE,ZESTORETIC) 20-25 MG per tablet Take 1 tablet by mouth daily.  . Multiple Vitamin (MULTIVITAMIN) tablet Take 1 tablet by mouth daily.  . naproxen (NAPROSYN) 500 MG tablet Take 500 mg by mouth 2 (two) times daily with a meal.  . [DISCONTINUED] levothyroxine (SYNTHROID, LEVOTHROID) 25 MCG tablet  Take 1 tablet (25 mcg total) by mouth daily before breakfast.   No facility-administered encounter medications on file as of 08/22/2018.    ALLERGIES: No Known Allergies VACCINATION STATUS:  There is no immunization history on file for this patient.  HPI -68 year old female patient with medical history as above.  She is being seen in follow-up status post radioactive iodine therapy on May 22, 2016.  She mains on low-dose levothyroxine 25 mcg p.o. daily before breakfast.      -Her recent fine-needle aspiration was negative for malignancy.  -She reports compliance. - She reports feeling better, no new complaints.  - She has other comorbidities including high blood pressure and high cholesterol , she is not on a beta blocker. - She denies family history of thyroid problem. She denies any neck exposure to radiation.     Objective:    There were no vitals taken for this visit.  Wt Readings from Last 3 Encounters:  02/14/18 208 lb (94.3 kg)  10/13/17 207 lb (93.9 kg)  09/20/17 207 lb  (93.9 kg)    Recent Results (from the past 2160 hour(s))  TSH     Status: None   Collection Time: 08/15/18  7:22 AM  Result Value Ref Range   TSH 0.41 0.40 - 4.50 mIU/L  T4, free     Status: None   Collection Time: 08/15/18  7:22 AM  Result Value Ref Range   Free T4 1.3 0.8 - 1.8 ng/dL       Thyroid ultrasound from August 30, 2017 IMPRESSION: Right nodule 2 is stable and continues to meet criteria for fine needle aspiration biopsy.  Left nodule 3 is stable in size but has increased in TIRADS category. It meets criteria for fine needle aspiration biopsy. Left lower nodule 4 has significantly increased in size and meets criteria for fine needle aspiration biopsy.  3 nodules on the left lobe of her thyroid was biopsied with benign findings.  Assessment & Plan:    1. RAI induced hypothyroidism  2. h/o Toxic multinodul goiter - She is status post RAI thyroid ablation on January12,  2018. -Her thyroid function tests are consistent with appropriate replacement with low dose of levothyroxine at 25 mcg p.o. every morning.    It is possible that her thyroid is still active and contributing with thyroid hormone secretion.   -She is advised to continue levothyroxine 25 mcg p.o. daily before breakfast.     - We discussed about the correct intake of her thyroid hormone, on empty stomach at fasting, with water, separated by at least 30 minutes from breakfast and other medications,  and separated by more than 4 hours from calcium, iron, multivitamins, acid reflux medications (PPIs). -Patient is made aware of the fact that thyroid hormone replacement is needed for life, dose to be adjusted by periodic monitoring of thyroid function tests.  -Fine-needle aspiration of 3 nodules on the left lobe is negative for malignancy.   -She will not require active surgical intervention at this time.   -She will be considered for follow-up surveillance ultrasound in 6 months with office visit.  - I  advised patient to maintain close follow up with Lucianne Lei, MD for primary care needs. Follow up plan: Return for Follow up with Pre-visit Labs, Thyroid / Neck Ultrasound.  Glade Lloyd, MD Phone: [phone]  Fax: [phone]  -  This note was partially dictated with voice recognition software. Similar sounding words can be transcribed inadequately or may not  be corrected  upon review.  08/22/2018, 1:32 PM

## 2019-02-23 ENCOUNTER — Other Ambulatory Visit: Payer: Self-pay | Admitting: "Endocrinology

## 2019-03-31 ENCOUNTER — Ambulatory Visit (HOSPITAL_COMMUNITY)
Admission: RE | Admit: 2019-03-31 | Discharge: 2019-03-31 | Disposition: A | Discharge status: Home / Self Care | Payer: BC Managed Care – PPO | Source: Ambulatory Visit | Attending: Family Medicine | Admitting: Family Medicine

## 2019-03-31 ENCOUNTER — Other Ambulatory Visit: Payer: Self-pay

## 2019-03-31 ENCOUNTER — Other Ambulatory Visit (HOSPITAL_COMMUNITY): Payer: Self-pay | Admitting: Family Medicine

## 2019-03-31 ENCOUNTER — Encounter (HOSPITAL_COMMUNITY): Payer: Self-pay

## 2019-03-31 DIAGNOSIS — M25561 Pain in right knee: Secondary | ICD-10-CM

## 2019-05-03 ENCOUNTER — Ambulatory Visit (HOSPITAL_COMMUNITY)
Admission: RE | Admit: 2019-05-03 | Discharge: 2019-05-03 | Disposition: A | Discharge status: Home / Self Care | Payer: BC Managed Care – PPO | Source: Ambulatory Visit | Attending: "Endocrinology | Admitting: "Endocrinology

## 2019-05-03 ENCOUNTER — Other Ambulatory Visit: Payer: Self-pay

## 2019-05-03 DIAGNOSIS — E042 Nontoxic multinodular goiter: Secondary | ICD-10-CM | POA: Diagnosis present

## 2019-05-19 ENCOUNTER — Other Ambulatory Visit: Payer: Self-pay

## 2019-05-19 ENCOUNTER — Encounter: Payer: Self-pay | Admitting: "Endocrinology

## 2019-05-19 ENCOUNTER — Ambulatory Visit (INDEPENDENT_AMBULATORY_CARE_PROVIDER_SITE_OTHER): Payer: Self-pay | Admitting: "Endocrinology

## 2019-05-19 DIAGNOSIS — E890 Postprocedural hypothyroidism: Secondary | ICD-10-CM

## 2019-05-19 DIAGNOSIS — E042 Nontoxic multinodular goiter: Secondary | ICD-10-CM

## 2019-05-19 NOTE — Progress Notes (Signed)
05/19/2019                                Endocrinology Telehealth Visit Follow up Note -During COVID -19 Pandemic  I connected with [place] on 05/19/2019   by telephone and verified that I am speaking with the correct person using two identifiers. [employer], 1950-10-03. she has verbally consented to this visit. All issues noted in this document were discussed and addressed. The format was not optimal for physical exam.  Subjective:    Patient ID: Christine Poole, female    DOB: 1950-09-25, PCP Lucianne Lei, MD   Past Medical History:  Diagnosis Date  . Arthritis   . Glaucoma   . Hyperplastic polyp of large intestine   . Hypertension    Past Surgical History:  Procedure Laterality Date  . ABDOMINAL HYSTERECTOMY    . COLONOSCOPY N/A 08/08/2012   [id number] polyps and internal hemorrhoids.Status post removal rectal polyps  . COLONOSCOPY N/A 04/16/2014   Procedure: COLONOSCOPY;  Surgeon: Daneil Dolin, MD;  Location: [facility];  Service: Endoscopy;  Laterality: N/A;  1:30pm  . KNEE ARTHROSCOPY Left    Social History   Socioeconomic History  . Marital status: Married    Spouse name: Not on file  . Number of children: Not on file  . Years of education: Not on file  . Highest education level: Not on file  Occupational History  . Not on file  Tobacco Use  . Smoking status: Former Smoker    Packs/day: 0.00    Years: 40.00    Pack years: 0.00    Types: Cigarettes  . Smokeless tobacco: Never Used  Substance and Sexual Activity  . Alcohol use: No  . Drug use: No  . Sexual activity: Yes    Birth control/protection: Surgical  Other Topics Concern  . Not on file  Social History Narrative  . Not on file   Social Determinants of Health   Financial Resource Strain:   . Difficulty of Paying Living Expenses: Not on file  Food Insecurity:   . Worried About Charity fundraiser in the Last Year: Not on file  . Ran Out of Food in the Last Year:  Not on file  Transportation Needs:   . Lack of Transportation (Medical): Not on file  . Lack of Transportation (Non-Medical): Not on file  Physical Activity:   . Days of Exercise per Week: Not on file  . Minutes of Exercise per Session: Not on file  Stress:   . Feeling of Stress : Not on file  Social Connections:   . Frequency of Communication with Friends and Family: Not on file  . Frequency of Social Gatherings with Friends and Family: Not on file  . Attends Religious Services: Not on file  . Active Member of Clubs or Organizations: Not on file  . Attends Archivist Meetings: Not on file  . Marital Status: Not on file   Outpatient Encounter Medications as of 05/19/2019  Medication Sig  . aspirin 81 MG tablet Take 81 mg by mouth daily.  Marland Kitchen diltiazem (CARDIZEM) 90 MG tablet Take 90 mg by mouth 2 (two) times daily.  Marland Kitchen etodolac (LODINE) 400 MG tablet Take 400 mg by mouth 2 (two) times daily.  Marland Kitchen ezetimibe (ZETIA) 10 MG tablet daily.  Marland Kitchen latanoprost (XALATAN) 0.005 % ophthalmic solution 1 drop at bedtime.  Marland Kitchen  levothyroxine (SYNTHROID) 25 MCG tablet TAKE 1 TABLET(25 MCG) BY MOUTH DAILY BEFORE BREAKFAST  . lisinopril-hydrochlorothiazide (PRINZIDE,ZESTORETIC) 20-25 MG per tablet Take 1 tablet by mouth daily.  . Multiple Vitamin (MULTIVITAMIN) tablet Take 1 tablet by mouth daily.  . naproxen (NAPROSYN) 500 MG tablet Take 500 mg by mouth 2 (two) times daily with a meal.   No facility-administered encounter medications on file as of 05/19/2019.   ALLERGIES: No Known Allergies VACCINATION STATUS:  There is no immunization history on file for this patient.  HPI -69 year old female patient with medical history as above.  She is being engaged in telehealth via telephone in follow-up status post radioactive iodine therapy on May 22, 2016.  She mains on low-dose levothyroxine 25 mcg p.o. daily before breakfast.     -Her recent fine-needle aspiration was negative for malignancy.  Her  previsit surveillance thyroid ultrasound is unremarkable. -She reports compliance. - She reports feeling better, no new complaints.  - She has other comorbidities including high blood pressure and high cholesterol , she is not on a beta blocker. - She denies family history of thyroid problem. She denies any neck exposure to radiation.     Objective:    There were no vitals taken for this visit.  Wt Readings from Last 3 Encounters:  02/14/18 208 lb (94.3 kg)  10/13/17 207 lb (93.9 kg)  09/20/17 207 lb (93.9 kg)    No results found for this or any previous visit (from the past 2160 hour(s)).     Thyroid ultrasound from August 30, 2017 IMPRESSION: Right nodule 2 is stable and continues to meet criteria for fine needle aspiration biopsy.  Left nodule 3 is stable in size but has increased in TIRADS category. It meets criteria for fine needle aspiration biopsy. Left lower nodule 4 has significantly increased in size and meets criteria for fine needle aspiration biopsy.  3 nodules on the left lobe of her thyroid was biopsied with benign findings.  Latest thyroid ultrasound on 05/03/19 IMPRESSION: Thyroid nodules have minimally changed. No new suspicious thyroid nodules.   Assessment & Plan:    1. RAI induced hypothyroidism  2. h/o Toxic multinodul goiter - She is status post RAI thyroid ablation on January12,  2018. - she did not have previsit  thyroid function tests . -she is on a minimal dose of levothyroxine , advised to continue Lt4 25 mcg po qam.   It is possible that her thyroid is still active and contributing with thyroid hormone secretion.      - We discussed about the correct intake of her thyroid hormone, on empty stomach at fasting, with water, separated by at least 30 minutes from breakfast and other medications,  and separated by more than 4 hours from calcium, iron, multivitamins, acid reflux medications (PPIs). -Patient is made aware of the fact that  thyroid hormone replacement is needed for life, dose to be adjusted by periodic monitoring of thyroid function tests.   -Fine-needle aspiration of 3 nodules on the left lobe is negative for malignancy.    -She will not require active surgical intervention at this time.   - Her  follow-up surveillance ultrasound is unremarkable, will need f/u in 1-2 years. - I advised patient to maintain close follow up with Lucianne Lei, MD for primary care needs.     - Time spent on this patient care encounter:  20 minutes of which 50% was spent in  counseling and the rest reviewing  her current and  previous  labs / studies and medications  doses and developing a plan for long term care. [place]  participated in the discussions, expressed understanding, and voiced agreement with the above plans.  All questions were answered to her satisfaction. she is encouraged to contact clinic should she have any questions or concerns prior to her return visit.  Follow up plan: Return in about 3 months (around 08/17/2019), or Patient requests AM Visit on a Monday, for Follow up with Pre-visit Labs.  Glade Lloyd, MD Phone: [phone]  Fax: [phone]  -  This note was partially dictated with voice recognition software. Similar sounding words can be transcribed inadequately or may not  be corrected upon review.  05/19/2019, 11:18 AM

## 2019-05-31 ENCOUNTER — Other Ambulatory Visit (HOSPITAL_COMMUNITY): Payer: Self-pay | Admitting: Family Medicine

## 2019-05-31 ENCOUNTER — Other Ambulatory Visit: Payer: Self-pay

## 2019-05-31 DIAGNOSIS — Z1231 Encounter for screening mammogram for malignant neoplasm of breast: Secondary | ICD-10-CM

## 2019-05-31 MED ORDER — LEVOTHYROXINE SODIUM 25 MCG PO TABS: ORAL_TABLET | ORAL | 0 refills | Status: DC

## 2019-06-12 ENCOUNTER — Ambulatory Visit: Payer: BC Managed Care – PPO | Attending: Internal Medicine

## 2019-06-12 ENCOUNTER — Other Ambulatory Visit: Payer: Self-pay

## 2019-06-12 DIAGNOSIS — Z20822 Contact with and (suspected) exposure to covid-19: Secondary | ICD-10-CM

## 2019-06-15 ENCOUNTER — Telehealth: Payer: Self-pay | Admitting: *Deleted

## 2019-06-15 LAB — NOVEL CORONAVIRUS, NAA: SARS-CoV-2, NAA: NOT DETECTED

## 2019-06-15 NOTE — Telephone Encounter (Signed)
contacted by D[place]on at [facility]; he states the pt's lab has been released and ready for review; verified result is available in chart; will contact pt.

## 2019-06-15 NOTE — Telephone Encounter (Signed)
Pt given result of COVID test; she verbalized understanding. 

## 2019-06-15 NOTE — Telephone Encounter (Signed)
Pt called for result of COVID test obtained 06/12/19; she is concerned that her husband's test is resulted but hers is not; explained the turn around time is based on the number of tests to be completed; she verbalized understanding; will attempt to find out status of lab, and call pt back with update; she can be contacted at [phone].

## 2019-06-15 NOTE — Telephone Encounter (Signed)
Spoke with Pamala Hurry at [facility]; she is unable to reach lab personnel to find out the status; she will call back with more information; she was given the callback number of [phone].

## 2019-06-26 ENCOUNTER — Other Ambulatory Visit: Payer: Self-pay

## 2019-06-26 ENCOUNTER — Ambulatory Visit (HOSPITAL_COMMUNITY)
Admission: RE | Admit: 2019-06-26 | Discharge: 2019-06-26 | Disposition: A | Discharge status: Home / Self Care | Payer: BC Managed Care – PPO | Source: Ambulatory Visit | Attending: Family Medicine | Admitting: Family Medicine

## 2019-06-26 DIAGNOSIS — Z1231 Encounter for screening mammogram for malignant neoplasm of breast: Secondary | ICD-10-CM | POA: Diagnosis present

## 2019-08-15 LAB — TSH: TSH: 0.24 mIU/L — ABNORMAL LOW (ref 0.40–4.50)

## 2019-08-15 LAB — T4, FREE: Free T4: 1.1 ng/dL (ref 0.8–1.8)

## 2019-08-21 ENCOUNTER — Other Ambulatory Visit: Payer: Self-pay

## 2019-08-21 ENCOUNTER — Ambulatory Visit (INDEPENDENT_AMBULATORY_CARE_PROVIDER_SITE_OTHER): Payer: BC Managed Care – PPO | Admitting: "Endocrinology

## 2019-08-21 ENCOUNTER — Encounter: Payer: Self-pay | Admitting: "Endocrinology

## 2019-08-21 VITALS — BP 118/77 | HR 70 | Ht 66.0 in | Wt 198.4 lb

## 2019-08-21 DIAGNOSIS — E042 Nontoxic multinodular goiter: Secondary | ICD-10-CM

## 2019-08-21 DIAGNOSIS — E890 Postprocedural hypothyroidism: Secondary | ICD-10-CM

## 2019-08-21 NOTE — Progress Notes (Signed)
08/21/2019              Endocrinology follow-up note   Subjective:    Patient ID: Christine Poole, female    DOB: [birth year], PCP Lucianne Lei, MD   Past Medical History:  Diagnosis Date  . Arthritis   . Glaucoma   . Hyperplastic polyp of large intestine   . Hypertension    Past Surgical History:  Procedure Laterality Date  . ABDOMINAL HYSTERECTOMY    . COLONOSCOPY N/A 08/08/2012   [id number] polyps and internal hemorrhoids.Status post removal rectal polyps  . COLONOSCOPY N/A 04/16/2014   Procedure: COLONOSCOPY;  Surgeon: Daneil Dolin, MD;  Location: [facility];  Service: Endoscopy;  Laterality: N/A;  1:30pm  . KNEE ARTHROSCOPY Left    Social History   Socioeconomic History  . Marital status: Married    Spouse name: Not on file  . Number of children: Not on file  . Years of education: Not on file  . Highest education level: Not on file  Occupational History  . Not on file  Tobacco Use  . Smoking status: Former Smoker    Packs/day: 0.00    Years: 40.00    Pack years: 0.00    Types: Cigarettes  . Smokeless tobacco: Never Used  Substance and Sexual Activity  . Alcohol use: No  . Drug use: No  . Sexual activity: Yes    Birth control/protection: Surgical  Other Topics Concern  . Not on file  Social History Narrative  . Not on file   Social Determinants of Health   Financial Resource Strain:   . Difficulty of Paying Living Expenses:   Food Insecurity:   . Worried About Charity fundraiser in the Last Year:   . Arboriculturist in the Last Year:   Transportation Needs:   . Film/video editor (Medical):   Marland Kitchen Lack of Transportation (Non-Medical):   Physical Activity:   . Days of Exercise per Week:   . Minutes of Exercise per Session:   Stress:   . Feeling of Stress :   Social Connections:   . Frequency of Communication with Friends and Family:   . Frequency of Social Gatherings with Friends and Family:   . Attends Religious Services:   .  Active Member of Clubs or Organizations:   . Attends Archivist Meetings:   Marland Kitchen Marital Status:    Outpatient Encounter Medications as of 08/21/2019  Medication Sig  . aspirin 81 MG tablet Take 81 mg by mouth daily.  Marland Kitchen diltiazem (CARDIZEM) 90 MG tablet Take 90 mg by mouth 2 (two) times daily.  Marland Kitchen etodolac (LODINE) 400 MG tablet Take 400 mg by mouth 2 (two) times daily.  Marland Kitchen ezetimibe (ZETIA) 10 MG tablet daily.  Marland Kitchen latanoprost (XALATAN) 0.005 % ophthalmic solution 1 drop at bedtime.  Marland Kitchen levothyroxine (SYNTHROID) 25 MCG tablet TAKE 1 TABLET(25 MCG) BY MOUTH DAILY BEFORE BREAKFAST  . lisinopril-hydrochlorothiazide (PRINZIDE,ZESTORETIC) 20-25 MG per tablet Take 1 tablet by mouth daily.  . Multiple Vitamin (MULTIVITAMIN) tablet Take 1 tablet by mouth daily.  . naproxen (NAPROSYN) 500 MG tablet Take 500 mg by mouth 2 (two) times daily with a meal.   No facility-administered encounter medications on file as of 08/21/2019.   ALLERGIES: No Known Allergies VACCINATION STATUS:  There is no immunization history on file for this patient.  HPI -68 year old female patient with medical history as above.  She is being engaged in telehealth  via telephone in follow-up status post radioactive iodine therapy on May 22, 2016.  She remains on low-dose levothyroxine 25 mcg p.o. daily before breakfast.       -Her recent fine-needle aspiration was negative for malignancy.  Her previsit surveillance thyroid ultrasound is unremarkable. -She reports compliance. - She reports feeling better, no new complaints.  - She has other comorbidities including high blood pressure and high cholesterol , she is not on a beta blocker. - She denies family history of thyroid problem. She denies any neck exposure to radiation.     Objective:    BP 118/77   Pulse 70   Ht 5\' 6"  (1.676 m)   Wt 198 lb 6.4 oz (90 kg)   BMI 32.02 kg/m   Wt Readings from Last 3 Encounters:  08/21/19 198 lb 6.4 oz (90 kg)  02/14/18  208 lb (94.3 kg)  10/13/17 207 lb (93.9 kg)      Physical Exam- Limited  Constitutional:  Body mass index is 32.02 kg/m. , not in acute distress, normal state of mind Eyes:  EOMI, no exophthalmos Neck: Supple Thyroid: + gross nodular goiter Respiratory: Adequate breathing efforts Musculoskeletal: no gross deformities, strength intact in all four extremities, no gross restriction of joint movements Skin:  no rashes, no hyperemia Neurological: no tremor with outstretched hands,   Recent Results (from the past 2160 hour(s))  Novel Coronavirus, NAA ([facility])     Status: None   Collection Time: 06/12/19  8:14 AM   Specimen: Nasopharyngeal(NP) swabs in vial transport medium   NASOPHARYNGE  TESTING  Result Value Ref Range   SARS-CoV-2, NAA Not Detected Not Detected    Comment: This nucleic acid amplification test was developed and its performance characteristics determined by [employer]. Nucleic acid amplification tests include RT-PCR and TMA. This test has not been FDA cleared or approved. This test has been authorized by FDA under an Emergency Use Authorization (EUA). This test is only authorized for the duration of time the declaration that circumstances exist justifying the authorization of the emergency use of in vitro diagnostic tests for detection of SARS-CoV-2 virus and/or diagnosis of COVID-19 infection under section 564(b)(1) of the Act, 21 U.S.C. [id number]) (1), unless the authorization is terminated or revoked sooner. When diagnostic testing is negative, the possibility of a false negative result should be considered in the context of a patient's recent exposures and the presence of clinical signs and symptoms consistent with COVID-19. An individual without symptoms of COVID-19 and who is not shedding SARS-CoV-2 virus wo uld expect to have a negative (not detected) result in this assay.   TSH SOLSTAS     Status: Abnormal   Collection Time: 08/14/19 11:14 AM   Result Value Ref Range   TSH 0.24 (L) 0.40 - 4.50 mIU/L  T4, free SOLSTAS     Status: None   Collection Time: 08/14/19 11:14 AM  Result Value Ref Range   Free T4 1.1 0.8 - 1.8 ng/dL       Thyroid ultrasound from August 30, 2017 IMPRESSION: Right nodule 2 is stable and continues to meet criteria for fine needle aspiration biopsy.  Left nodule 3 is stable in size but has increased in TIRADS category. It meets criteria for fine needle aspiration biopsy. Left lower nodule 4 has significantly increased in size and meets criteria for fine needle aspiration biopsy.  3 nodules on the left lobe of her thyroid was biopsied with benign findings.  Latest thyroid ultrasound on 05/03/19 IMPRESSION: Thyroid  nodules have minimally changed. No new suspicious thyroid nodules.   Assessment & Plan:    1. RAI induced hypothyroidism  2. h/o Toxic multinodul goiter - She is status post RAI thyroid ablation on January12,  2018. -Her recent thyroid/neck ultrasound was unremarkable, stable thyroid size and nodules remained stable. -she is on a minimal dose of levothyroxine. Her previsit thyroid function tests are consistent with target free T4, slightly suppressed TSH.  She would benefit from continued supplement with low-dose levothyroxine.  It is possible that her thyroid is still active and contributing with thyroid hormone secretion.    - We discussed about the correct intake of her thyroid hormone, on empty stomach at fasting, with water, separated by at least 30 minutes from breakfast and other medications,  and separated by more than 4 hours from calcium, iron, multivitamins, acid reflux medications (PPIs). -Patient is made aware of the fact that thyroid hormone replacement is needed for life, dose to be adjusted by periodic monitoring of thyroid function tests.   -Fine-needle aspiration of 3 nodules on the left lobe is negative for malignancy.    -She will not require active surgical  intervention at this time.   - Her  follow-up surveillance ultrasound is unremarkable, will need f/u in 1-2 years.   - I advised patient to maintain close follow up with Lucianne Lei, MD for primary care needs.     - Time spent on this patient care encounter:  20 minutes of which 50% was spent in  counseling and the rest reviewing  her current and  previous labs / studies and medications  doses and developing a plan for long term care. [place]  participated in the discussions, expressed understanding, and voiced agreement with the above plans.  All questions were answered to her satisfaction. she is encouraged to contact clinic should she have any questions or concerns prior to her return visit.   Follow up plan: Return in about 10 weeks (around 10/30/2019) for Follow up with Pre-visit Labs.  Glade Lloyd, MD Phone: [phone]  Fax: [phone]  -  This note was partially dictated with voice recognition software. Similar sounding words can be transcribed inadequately or may not  be corrected upon review.  08/21/2019, 7:16 PM

## 2019-08-29 ENCOUNTER — Other Ambulatory Visit: Payer: Self-pay | Admitting: "Endocrinology

## 2019-10-30 ENCOUNTER — Ambulatory Visit: Payer: BC Managed Care – PPO | Admitting: "Endocrinology

## 2019-10-30 LAB — TSH: TSH: 0.54 mIU/L (ref 0.40–4.50)

## 2019-10-30 LAB — T4, FREE: Free T4: 1.3 ng/dL (ref 0.8–1.8)

## 2019-10-30 LAB — T3, FREE: T3, Free: 2.7 pg/mL (ref 2.3–4.2)

## 2019-11-06 ENCOUNTER — Encounter: Payer: Self-pay | Admitting: "Endocrinology

## 2019-11-06 ENCOUNTER — Other Ambulatory Visit: Payer: Self-pay

## 2019-11-06 ENCOUNTER — Ambulatory Visit (INDEPENDENT_AMBULATORY_CARE_PROVIDER_SITE_OTHER): Payer: BC Managed Care – PPO | Admitting: "Endocrinology

## 2019-11-06 ENCOUNTER — Other Ambulatory Visit: Payer: Self-pay | Admitting: "Endocrinology

## 2019-11-06 VITALS — BP 124/80 | HR 79 | Ht 66.0 in | Wt 197.8 lb

## 2019-11-06 DIAGNOSIS — E890 Postprocedural hypothyroidism: Secondary | ICD-10-CM | POA: Diagnosis not present

## 2019-11-06 MED ORDER — LEVOTHYROXINE SODIUM 25 MCG PO TABS: ORAL_TABLET | ORAL | 1 refills | Status: DC

## 2019-11-06 NOTE — Progress Notes (Signed)
11/06/2019              Endocrinology follow-up note   Subjective:    Patient ID: Christine Poole, female    DOB: 10-20-1950, PCP Lucianne Lei, MD   Past Medical History:  Diagnosis Date  . Arthritis   . Glaucoma   . Hyperplastic polyp of large intestine   . Hypertension    Past Surgical History:  Procedure Laterality Date  . ABDOMINAL HYSTERECTOMY    . COLONOSCOPY N/A 08/08/2012   [id number] polyps and internal hemorrhoids.Status post removal rectal polyps  . COLONOSCOPY N/A 04/16/2014   Procedure: COLONOSCOPY;  Surgeon: Daneil Dolin, MD;  Location: [facility];  Service: Endoscopy;  Laterality: N/A;  1:30pm  . KNEE ARTHROSCOPY Left    Social History   Socioeconomic History  . Marital status: Married    Spouse name: Not on file  . Number of children: Not on file  . Years of education: Not on file  . Highest education level: Not on file  Occupational History  . Not on file  Tobacco Use  . Smoking status: Former Smoker    Packs/day: 0.00    Years: 40.00    Pack years: 0.00    Types: Cigarettes  . Smokeless tobacco: Never Used  Vaping Use  . Vaping Use: Never used  Substance and Sexual Activity  . Alcohol use: No  . Drug use: No  . Sexual activity: Yes    Birth control/protection: Surgical  Other Topics Concern  . Not on file  Social History Narrative  . Not on file   Social Determinants of Health   Financial Resource Strain:   . Difficulty of Paying Living Expenses:   Food Insecurity:   . Worried About Charity fundraiser in the Last Year:   . Arboriculturist in the Last Year:   Transportation Needs:   . Film/video editor (Medical):   Marland Kitchen Lack of Transportation (Non-Medical):   Physical Activity:   . Days of Exercise per Week:   . Minutes of Exercise per Session:   Stress:   . Feeling of Stress :   Social Connections:   . Frequency of Communication with Friends and Family:   . Frequency of Social Gatherings with Friends and  Family:   . Attends Religious Services:   . Active Member of Clubs or Organizations:   . Attends Archivist Meetings:   Marland Kitchen Marital Status:    Outpatient Encounter Medications as of 11/06/2019  Medication Sig  . aspirin 81 MG tablet Take 81 mg by mouth daily.  Marland Kitchen diltiazem (CARDIZEM) 90 MG tablet Take 90 mg by mouth 2 (two) times daily.  Marland Kitchen etodolac (LODINE) 400 MG tablet Take 400 mg by mouth 2 (two) times daily.  Marland Kitchen ezetimibe (ZETIA) 10 MG tablet daily.  Marland Kitchen latanoprost (XALATAN) 0.005 % ophthalmic solution 1 drop at bedtime.  Marland Kitchen lisinopril-hydrochlorothiazide (PRINZIDE,ZESTORETIC) 20-25 MG per tablet Take 1 tablet by mouth daily.  . Multiple Vitamin (MULTIVITAMIN) tablet Take 1 tablet by mouth daily.  . naproxen (NAPROSYN) 500 MG tablet Take 500 mg by mouth 2 (two) times daily with a meal.  . [DISCONTINUED] levothyroxine (SYNTHROID) 25 MCG tablet TAKE 1 TABLET(25 MCG) BY MOUTH DAILY BEFORE BREAKFAST  . [DISCONTINUED] levothyroxine (SYNTHROID) 25 MCG tablet As directed   No facility-administered encounter medications on file as of 11/06/2019.   ALLERGIES: No Known Allergies VACCINATION STATUS:  There is no immunization history on file  for this patient.  HPI -69 year old female patient with medical history as above.  She is being engaged in telehealth via telephone in follow-up status post radioactive iodine therapy on May 22, 2016.  She remains on low-dose levothyroxine 25 mcg p.o. daily before breakfast.       -Her recent fine-needle aspiration was negative for malignancy.  Her previsit surveillance thyroid ultrasound is unremarkable. -She reports compliance.  She has no new complaints today. - She reports feeling better, no new complaints.  - She has other comorbidities including high blood pressure and high cholesterol , she is not on a beta blocker. - She denies family history of thyroid problem. She denies any neck exposure to radiation.     Objective:    BP 124/80  (BP Location: Left Arm, Patient Position: Sitting)   Pulse 79   Ht 5\' 6"  (1.676 m)   Wt 197 lb 12.8 oz (89.7 kg)   BMI 31.93 kg/m   Wt Readings from Last 3 Encounters:  11/06/19 197 lb 12.8 oz (89.7 kg)  08/21/19 198 lb 6.4 oz (90 kg)  02/14/18 208 lb (94.3 kg)      Physical Exam- Limited  Constitutional:  Body mass index is 31.93 kg/m. , not in acute distress, normal state of mind Eyes:  EOMI, no exophthalmos Neck: Supple Thyroid: + gross nodular goiter Respiratory: Adequate breathing efforts Musculoskeletal: no gross deformities, strength intact in all four extremities, no gross restriction of joint movements Skin:  no rashes, no hyperemia Neurological: no tremor with outstretched hands,   Recent Results (from the past 2160 hour(s))  TSH SOLSTAS     Status: Abnormal   Collection Time: 08/14/19 11:14 AM  Result Value Ref Range   TSH 0.24 (L) 0.40 - 4.50 mIU/L  T4, free SOLSTAS     Status: None   Collection Time: 08/14/19 11:14 AM  Result Value Ref Range   Free T4 1.1 0.8 - 1.8 ng/dL  T4, free     Status: None   Collection Time: 10/30/19  7:28 AM  Result Value Ref Range   Free T4 1.3 0.8 - 1.8 ng/dL  TSH     Status: None   Collection Time: 10/30/19  7:28 AM  Result Value Ref Range   TSH 0.54 0.40 - 4.50 mIU/L  T3, free     Status: None   Collection Time: 10/30/19  7:28 AM  Result Value Ref Range   T3, Free 2.7 2.3 - 4.2 pg/mL       Thyroid ultrasound from August 30, 2017 IMPRESSION: Right nodule 2 is stable and continues to meet criteria for fine needle aspiration biopsy.  Left nodule 3 is stable in size but has increased in TIRADS category. It meets criteria for fine needle aspiration biopsy. Left lower nodule 4 has significantly increased in size and meets criteria for fine needle aspiration biopsy.  3 nodules on the left lobe of her thyroid was biopsied with benign findings.  Latest thyroid ultrasound on 05/03/19 IMPRESSION: Thyroid nodules have  minimally changed. No new suspicious thyroid nodules.   Assessment & Plan:    1. RAI induced hypothyroidism  2. h/o Toxic multinodul goiter - She is status post RAI thyroid ablation on January12,  2018. -Her recent thyroid/neck ultrasound was unremarkable, stable thyroid size and nodules remained stable. -He remains on minimal dose of levothyroxine.  Her previsit labs are consistent with appropriate replacement.   -She is advised to continue levothyroxine 25 mcg p.o. daily before breakfast.  It  is possible that her thyroid is still active and contributing with thyroid hormone secretion.     - We discussed about the correct intake of her thyroid hormone, on empty stomach at fasting, with water, separated by at least 30 minutes from breakfast and other medications,  and separated by more than 4 hours from calcium, iron, multivitamins, acid reflux medications (PPIs). -Patient is made aware of the fact that thyroid hormone replacement is needed for life, dose to be adjusted by periodic monitoring of thyroid function tests.   -Fine-needle aspiration of 3 nodules on the left lobe is negative for malignancy.    -She will not require active surgical intervention at this time.   - Her  follow-up surveillance ultrasound is unremarkable, will need f/u in 1-2 years.   - I advised patient to maintain close follow up with Lucianne Lei, MD for primary care needs.     - Time spent on this patient care encounter:  20 minutes of which 50% was spent in  counseling and the rest reviewing  her current and  previous labs / studies and medications  doses and developing a plan for long term care. [place]  participated in the discussions, expressed understanding, and voiced agreement with the above plans.  All questions were answered to her satisfaction. she is encouraged to contact clinic should she have any questions or concerns prior to her return visit.   Follow up plan: Return in about 4  months (around 03/07/2020) for F/U with Pre-visit Labs.  Glade Lloyd, MD Phone: [phone]  Fax: [phone]  -  This note was partially dictated with voice recognition software. Similar sounding words can be transcribed inadequately or may not  be corrected upon review.  11/06/2019, 5:17 PM

## 2019-12-09 ENCOUNTER — Other Ambulatory Visit: Payer: Self-pay | Admitting: "Endocrinology

## 2020-03-05 LAB — T4, FREE: Free T4: 1.3 ng/dL (ref 0.8–1.8)

## 2020-03-05 LAB — TSH: TSH: 0.51 mIU/L (ref 0.40–4.50)

## 2020-03-11 ENCOUNTER — Ambulatory Visit (INDEPENDENT_AMBULATORY_CARE_PROVIDER_SITE_OTHER): Payer: BC Managed Care – PPO | Admitting: Nurse Practitioner

## 2020-03-11 ENCOUNTER — Other Ambulatory Visit: Payer: Self-pay

## 2020-03-11 ENCOUNTER — Encounter: Payer: Self-pay | Admitting: Nurse Practitioner

## 2020-03-11 VITALS — BP 121/76 | HR 83 | Ht 66.0 in | Wt 188.0 lb

## 2020-03-11 DIAGNOSIS — E890 Postprocedural hypothyroidism: Secondary | ICD-10-CM | POA: Diagnosis not present

## 2020-03-11 DIAGNOSIS — E042 Nontoxic multinodular goiter: Secondary | ICD-10-CM | POA: Diagnosis not present

## 2020-03-11 NOTE — Patient Instructions (Signed)

## 2020-03-11 NOTE — Progress Notes (Signed)
03/11/2020              Endocrinology follow-up note   Subjective:    Patient ID: Christine Poole, female    DOB: [birth year], PCP Lucianne Lei, MD   Past Medical History:  Diagnosis Date  . Arthritis   . Glaucoma   . Hyperplastic polyp of large intestine   . Hypertension    Past Surgical History:  Procedure Laterality Date  . ABDOMINAL HYSTERECTOMY    . COLONOSCOPY N/A 08/08/2012   [id number] polyps and internal hemorrhoids.Status post removal rectal polyps  . COLONOSCOPY N/A 04/16/2014   Procedure: COLONOSCOPY;  Surgeon: Daneil Dolin, MD;  Location: [facility];  Service: Endoscopy;  Laterality: N/A;  1:30pm  . KNEE ARTHROSCOPY Left    Social History   Socioeconomic History  . Marital status: Married    Spouse name: Not on file  . Number of children: Not on file  . Years of education: Not on file  . Highest education level: Not on file  Occupational History  . Not on file  Tobacco Use  . Smoking status: Former Smoker    Packs/day: 0.00    Years: 40.00    Pack years: 0.00    Types: Cigarettes  . Smokeless tobacco: Never Used  Vaping Use  . Vaping Use: Never used  Substance and Sexual Activity  . Alcohol use: No  . Drug use: No  . Sexual activity: Yes    Birth control/protection: Surgical  Other Topics Concern  . Not on file  Social History Narrative  . Not on file   Social Determinants of Health   Financial Resource Strain:   . Difficulty of Paying Living Expenses: Not on file  Food Insecurity:   . Worried About Charity fundraiser in the Last Year: Not on file  . Ran Out of Food in the Last Year: Not on file  Transportation Needs:   . Lack of Transportation (Medical): Not on file  . Lack of Transportation (Non-Medical): Not on file  Physical Activity:   . Days of Exercise per Week: Not on file  . Minutes of Exercise per Session: Not on file  Stress:   . Feeling of Stress : Not on file  Social Connections:   . Frequency of  Communication with Friends and Family: Not on file  . Frequency of Social Gatherings with Friends and Family: Not on file  . Attends Religious Services: Not on file  . Active Member of Clubs or Organizations: Not on file  . Attends Archivist Meetings: Not on file  . Marital Status: Not on file   Outpatient Encounter Medications as of 03/11/2020  Medication Sig  . aspirin 81 MG tablet Take 81 mg by mouth daily.  Marland Kitchen diltiazem (CARDIZEM) 90 MG tablet Take 90 mg by mouth 2 (two) times daily.  Marland Kitchen etodolac (LODINE) 400 MG tablet Take 400 mg by mouth 2 (two) times daily.  Marland Kitchen ezetimibe (ZETIA) 10 MG tablet daily.  Marland Kitchen JANUMET XR [id number] MG TB24 Take 1 tablet by mouth daily.  Marland Kitchen JARDIANCE 10 MG TABS tablet Take 10 mg by mouth daily.  Marland Kitchen latanoprost (XALATAN) 0.005 % ophthalmic solution 1 drop at bedtime.  Marland Kitchen levothyroxine (SYNTHROID) 25 MCG tablet TAKE 1 TABLET(25 MCG) BY MOUTH DAILY BEFORE BREAKFAST  . lisinopril-hydrochlorothiazide (PRINZIDE,ZESTORETIC) 20-25 MG per tablet Take 1 tablet by mouth daily.  . Multiple Vitamin (MULTIVITAMIN) tablet Take 1 tablet by mouth daily.  . naproxen (  NAPROSYN) 500 MG tablet Take 500 mg by mouth 2 (two) times daily with a meal.  . potassium chloride SA (KLOR-CON) 20 MEQ tablet Take 40 mEq by mouth daily.  . rosuvastatin (CRESTOR) 40 MG tablet Take 40 mg by mouth daily.   No facility-administered encounter medications on file as of 03/11/2020.   ALLERGIES: No Known Allergies VACCINATION STATUS:  There is no immunization history on file for this patient.  Thyroid Problem Presents for follow-up ( status post radioactive iodine therapy on May 22, 2016.Marland KitchenHer recent fine-needle aspiration was negative for malignancy.  Her previsit surveillance thyroid ultrasound is unremarkable.) visit. Patient reports no anxiety, cold intolerance, constipation, depressed mood, diarrhea, fatigue, heat intolerance, leg swelling, palpitations, tremors, weight gain or weight  loss. The symptoms have been stable.    - She has other comorbidities including high blood pressure and high cholesterol , she is not on a beta blocker. - She denies family history of thyroid problem. She denies any neck exposure to radiation.   Review of systems  Constitutional: + Minimally fluctuating body weight,  current Body mass index is 30.34 kg/m. , no fatigue, no subjective hyperthermia, no subjective hypothermia Eyes: no blurry vision, no xerophthalmia ENT: no sore throat, no nodules palpated in throat, no dysphagia/odynophagia, no hoarseness Cardiovascular: no chest pain, no shortness of breath, no palpitations, no leg swelling Respiratory: no cough, no shortness of breath Gastrointestinal: no nausea/vomiting/diarrhea Musculoskeletal: no muscle/joint aches, c/o leg cramping after working 12 hour shift on her feet Skin: no rashes, no hyperemia Neurological: no tremors, no numbness, no tingling, no dizziness Psychiatric: no depression, no anxiety  Objective:    BP 121/76 (BP Location: Right Arm, Patient Position: Sitting)   Pulse 83   Ht 5\' 6"  (1.676 m)   Wt 188 lb (85.3 kg)   BMI 30.34 kg/m   Wt Readings from Last 3 Encounters:  03/11/20 188 lb (85.3 kg)  11/06/19 197 lb 12.8 oz (89.7 kg)  08/21/19 198 lb 6.4 oz (90 kg)    BP Readings from Last 3 Encounters:  03/11/20 121/76  11/06/19 124/80  08/21/19 118/77     Physical Exam- Limited  Constitutional:  Body mass index is 30.34 kg/m. , not in acute distress, normal state of mind Eyes:  EOMI, no exophthalmos Neck: Supple Thyroid: + gross nodular goiter Cardiovascular: RRR, no murmers, rubs, or gallops, no edema Respiratory: Adequate breathing efforts, no crackles, rales, rhonchi, or wheezing Musculoskeletal: no gross deformities, strength intact in all four extremities, no gross restriction of joint movements Skin:  no rashes, no hyperemia Neurological: no tremor with outstretched hands  Recent Results  (from the past 2160 hour(s))  TSH     Status: None   Collection Time: 03/04/20  7:26 AM  Result Value Ref Range   TSH 0.51 0.40 - 4.50 mIU/L  T4, free     Status: None   Collection Time: 03/04/20  7:26 AM  Result Value Ref Range   Free T4 1.3 0.8 - 1.8 ng/dL     Thyroid ultrasound from August 30, 2017 IMPRESSION: Right nodule 2 is stable and continues to meet criteria for fine needle aspiration biopsy.  Left nodule 3 is stable in size but has increased in TIRADS category. It meets criteria for fine needle aspiration biopsy. Left lower nodule 4 has significantly increased in size and meets criteria for fine needle aspiration biopsy.  3 nodules on the left lobe of her thyroid was biopsied with benign findings.  Latest thyroid ultrasound on  05/03/19 IMPRESSION: Thyroid nodules have minimally changed. No new suspicious thyroid nodules.   Assessment & Plan:    1. RAI induced hypothyroidism  - She is status post RAI thyroid ablation on January12,  2018. -Her previsit TFTs were consistent with appropriate hormone replacement.  She is advised to continue Levothyroxine 25 mcg po daily before breakfast.   - We discussed about the correct intake of her thyroid hormone, on empty stomach at fasting, with water, separated by at least 30 minutes from breakfast and other medications,  and separated by more than 4 hours from calcium, iron, multivitamins, acid reflux medications (PPIs). -Patient is made aware of the fact that thyroid hormone replacement is needed for life, dose to be adjusted by periodic monitoring of thyroid function tests.  2. H/o Toxic multinodul goiter -Her most recent thyroid/neck ultrasound from 04/2019 was unremarkable, stable thyroid size and nodules remained stable.  -Fine-needle aspiration of 3 nodules on the left lobe is negative for malignancy.    -She will not require active surgical intervention at this time.    -Due for follow up thyroid US prior to next  visit.   - I advised patient to maintain close follow up with Lucianne Lei, MD for primary care needs.      - Time spent on this patient care encounter:  20 minutes of which 50% was spent in  counseling and the rest reviewing  her current and  previous labs / studies and medications  doses and developing a plan for long term care. [place]  participated in the discussions, expressed understanding, and voiced agreement with the above plans.  All questions were answered to her satisfaction. she is encouraged to contact clinic should she have any questions or concerns prior to her return visit.   Follow up plan: Return in about 4 months (around 07/09/2020) for Thyroid follow up, thyroid ultrasound, Previsit labs.  Rayetta Pigg, [facility] [address] [place], [place] [address] Phone: [phone] Fax: [phone]  03/11/2020, 8:40 AM

## 2020-05-27 ENCOUNTER — Other Ambulatory Visit (HOSPITAL_COMMUNITY): Payer: Self-pay | Admitting: Family Medicine

## 2020-05-27 DIAGNOSIS — Z1231 Encounter for screening mammogram for malignant neoplasm of breast: Secondary | ICD-10-CM

## 2020-06-14 ENCOUNTER — Other Ambulatory Visit: Payer: Self-pay | Admitting: "Endocrinology

## 2020-06-17 ENCOUNTER — Ambulatory Visit (HOSPITAL_COMMUNITY)
Admission: RE | Admit: 2020-06-17 | Discharge: 2020-06-17 | Disposition: A | Discharge status: Home / Self Care | Payer: BC Managed Care – PPO | Source: Ambulatory Visit | Attending: Nurse Practitioner | Admitting: Nurse Practitioner

## 2020-06-17 ENCOUNTER — Other Ambulatory Visit: Payer: Self-pay

## 2020-06-17 DIAGNOSIS — E890 Postprocedural hypothyroidism: Secondary | ICD-10-CM | POA: Diagnosis present

## 2020-06-17 DIAGNOSIS — E042 Nontoxic multinodular goiter: Secondary | ICD-10-CM | POA: Insufficient documentation

## 2020-07-01 ENCOUNTER — Other Ambulatory Visit: Payer: Self-pay

## 2020-07-01 ENCOUNTER — Ambulatory Visit (HOSPITAL_COMMUNITY)
Admission: RE | Admit: 2020-07-01 | Discharge: 2020-07-01 | Disposition: A | Discharge status: Home / Self Care | Payer: BC Managed Care – PPO | Source: Ambulatory Visit | Attending: Family Medicine | Admitting: Family Medicine

## 2020-07-01 DIAGNOSIS — Z1231 Encounter for screening mammogram for malignant neoplasm of breast: Secondary | ICD-10-CM | POA: Insufficient documentation

## 2020-07-02 LAB — T4, FREE: Free T4: 1.22 ng/dL (ref 0.82–1.77)

## 2020-07-02 LAB — TSH: TSH: 0.493 u[IU]/mL (ref 0.450–4.500)

## 2020-07-08 ENCOUNTER — Other Ambulatory Visit: Payer: Self-pay

## 2020-07-08 ENCOUNTER — Encounter: Payer: Self-pay | Admitting: Nurse Practitioner

## 2020-07-08 ENCOUNTER — Ambulatory Visit (INDEPENDENT_AMBULATORY_CARE_PROVIDER_SITE_OTHER): Payer: BC Managed Care – PPO | Admitting: Nurse Practitioner

## 2020-07-08 VITALS — BP 118/76 | HR 71 | Wt 189.0 lb

## 2020-07-08 DIAGNOSIS — E042 Nontoxic multinodular goiter: Secondary | ICD-10-CM

## 2020-07-08 DIAGNOSIS — E890 Postprocedural hypothyroidism: Secondary | ICD-10-CM

## 2020-07-08 NOTE — Progress Notes (Signed)
07/08/2020              Endocrinology follow-up note   Subjective:    Patient ID: Christine Poole, female    DOB: [birth year], PCP Lucianne Lei, MD   Past Medical History:  Diagnosis Date  . Arthritis   . Glaucoma   . Hyperplastic polyp of large intestine   . Hypertension    Past Surgical History:  Procedure Laterality Date  . ABDOMINAL HYSTERECTOMY    . COLONOSCOPY N/A 08/08/2012   WUJ:[id number] polyps and internal hemorrhoids.Status post removal rectal polyps  . COLONOSCOPY N/A 04/16/2014   Procedure: COLONOSCOPY;  Surgeon: Daneil Dolin, MD;  Location: [facility];  Service: Endoscopy;  Laterality: N/A;  1:30pm  . KNEE ARTHROSCOPY Left    Social History   Socioeconomic History  . Marital status: Married    Spouse name: Not on file  . Number of children: Not on file  . Years of education: Not on file  . Highest education level: Not on file  Occupational History  . Not on file  Tobacco Use  . Smoking status: Former Smoker    Packs/day: 0.00    Years: 40.00    Pack years: 0.00    Types: Cigarettes  . Smokeless tobacco: Never Used  Vaping Use  . Vaping Use: Never used  Substance and Sexual Activity  . Alcohol use: No  . Drug use: No  . Sexual activity: Yes    Birth control/protection: Surgical  Other Topics Concern  . Not on file  Social History Narrative  . Not on file   Social Determinants of Health   Financial Resource Strain: Not on file  Food Insecurity: Not on file  Transportation Needs: Not on file  Physical Activity: Not on file  Stress: Not on file  Social Connections: Not on file   Outpatient Encounter Medications as of 07/08/2020  Medication Sig  . aspirin 81 MG tablet Take 81 mg by mouth daily.  Marland Kitchen diltiazem (CARDIZEM) 90 MG tablet Take 90 mg by mouth 2 (two) times daily.  Marland Kitchen etodolac (LODINE) 400 MG tablet Take 400 mg by mouth 2 (two) times daily.  Marland Kitchen ezetimibe (ZETIA) 10 MG tablet daily.  Marland Kitchen JANUMET XR [phone] MG TB24 Take 1  tablet by mouth daily.  Marland Kitchen JARDIANCE 10 MG TABS tablet Take 10 mg by mouth daily.  Marland Kitchen latanoprost (XALATAN) 0.005 % ophthalmic solution 1 drop at bedtime.  Marland Kitchen levothyroxine (SYNTHROID) 25 MCG tablet TAKE 1 TABLET(25 MCG) BY MOUTH DAILY BEFORE BREAKFAST  . lisinopril-hydrochlorothiazide (PRINZIDE,ZESTORETIC) 20-25 MG per tablet Take 1 tablet by mouth daily.  . Multiple Vitamin (MULTIVITAMIN) tablet Take 1 tablet by mouth daily.  . naproxen (NAPROSYN) 500 MG tablet Take 500 mg by mouth 2 (two) times daily with a meal.  . potassium chloride SA (KLOR-CON) 20 MEQ tablet Take 40 mEq by mouth daily.  . rosuvastatin (CRESTOR) 40 MG tablet Take 40 mg by mouth daily.   No facility-administered encounter medications on file as of 07/08/2020.   ALLERGIES: No Known Allergies VACCINATION STATUS:  There is no immunization history on file for this patient.  Thyroid Problem Presents for follow-up ( status post radioactive iodine therapy on May 22, 2016.Marland KitchenHer recent fine-needle aspiration was negative for malignancy.  Her previsit surveillance thyroid ultrasound is unremarkable.) visit. Patient reports no anxiety, cold intolerance, constipation, depressed mood, diarrhea, fatigue, heat intolerance, leg swelling, palpitations, tremors, weight gain or weight loss. (Gets sharp pains in right side  of neck intermittently that resolves without intervention) The symptoms have been stable.    - She has other comorbidities including high blood pressure and high cholesterol , she is not on a beta blocker. - She denies family history of thyroid problem. She denies any neck exposure to radiation.   Review of systems  Constitutional: + Minimally fluctuating body weight,  current Body mass index is 30.51 kg/m. , no fatigue, no subjective hyperthermia, no subjective hypothermia Eyes: no blurry vision, no xerophthalmia ENT: no sore throat, no nodules palpated in throat, no dysphagia/odynophagia, no hoarseness, complains  of intermittent sharp pains in right side of neck which resolve without intervention Cardiovascular: no chest pain, no shortness of breath, no palpitations, no leg swelling Respiratory: no cough, no shortness of breath Gastrointestinal: no nausea/vomiting/diarrhea Musculoskeletal: no muscle/joint aches, c/o leg cramping after working 12 hour shift on her feet Skin: no rashes, no hyperemia Neurological: no tremors, no numbness, no tingling, no dizziness Psychiatric: no depression, no anxiety  Objective:    BP 118/76 (BP Location: Left Arm, Patient Position: Sitting)   Pulse 71   Wt 189 lb (85.7 kg)   BMI 30.51 kg/m   Wt Readings from Last 3 Encounters:  07/08/20 189 lb (85.7 kg)  03/11/20 188 lb (85.3 kg)  11/06/19 197 lb 12.8 oz (89.7 kg)    BP Readings from Last 3 Encounters:  07/08/20 118/76  03/11/20 121/76  11/06/19 124/80    Physical Exam- Limited  Constitutional:  Body mass index is 30.51 kg/m. , not in acute distress, normal state of mind Eyes:  EOMI, no exophthalmos Neck: Supple Thyroid: + gross nodular goiter Cardiovascular: RRR, no murmers, rubs, or gallops, no edema Respiratory: Adequate breathing efforts, no crackles, rales, rhonchi, or wheezing Musculoskeletal: no gross deformities, strength intact in all four extremities, no gross restriction of joint movements Skin:  no rashes, no hyperemia Neurological: no tremor with outstretched hands   Recent Results (from the past 2160 hour(s))  TSH     Status: None   Collection Time: 07/01/20  8:05 AM  Result Value Ref Range   TSH 0.493 0.450 - 4.500 uIU/mL  T4, free     Status: None   Collection Time: 07/01/20  8:05 AM  Result Value Ref Range   Free T4 1.22 0.82 - 1.77 ng/dL     Thyroid ultrasound from August 30, 2017 IMPRESSION: Right nodule 2 is stable and continues to meet criteria for fine needle aspiration biopsy.  Left nodule 3 is stable in size but has increased in TIRADS category. It meets  criteria for fine needle aspiration biopsy. Left lower nodule 4 has significantly increased in size and meets criteria for fine needle aspiration biopsy.  3 nodules on the left lobe of her thyroid was biopsied with benign findings.  Latest thyroid ultrasound on 05/03/19 IMPRESSION: Thyroid nodules have minimally changed. No new suspicious thyroid nodules. ------------------------------------------------------------------------------------------------------------------------------- US Thyroid 06/17/20 CLINICAL DATA:  Prior ultrasound follow-up.  EXAM: THYROID ULTRASOUND  TECHNIQUE: Ultrasound examination of the thyroid gland and adjacent soft tissues was performed.  COMPARISON:  05/03/2019  FINDINGS: Parenchymal Echotexture: Mildly heterogenous  Isthmus: 0.3 cm, previously 0.2 cm  Right lobe: 6.1 x 3.6 x 3.7 cm, previously 5.6 x 3.5 x 3.8 cm  Left lobe: 5.6 x 2.8 x 1.5 cm, previously 5.3 x 1.7 x 1.6 cm  _________________________________________________________  Estimated total number of nodules >/= 1 cm: 3  Number of spongiform nodules >/=  2 cm not described below (TR1): 0  Number of mixed cystic and solid nodules >/= 1.5 cm not described below ([place]): 0  _________________________________________________________  Nodule # 1:  Prior biopsy: No  Location: Isthmus; Inferior, left  Maximum size: 0.9 cm; Other 2 dimensions: 0.7 x 0.8 cm, previously, 0.9 x 0.8 x 0.8 cm  Composition: solid/almost completely solid (2)  Echogenicity: hypoechoic (2)  Shape: not taller-than-wide (0)  Margins: ill-defined (0)  Echogenic foci: none (0)  ACR TI-RADS total points: 4.  ACR TI-RADS risk category:  TR4 (4-6 points).  Significant change in size (>/= 20% in two dimensions and minimal increase of 2 mm): No  Change in features: No  Change in ACR TI-RADS risk category: No  ACR TI-RADS recommendations:  Given size (<0.9 cm) and appearance,  this nodule does NOT meet TI-RADS criteria for biopsy or dedicated follow-up.  _________________________________________________________  Nodule # 2:  Prior biopsy: Yes  Location: Right; Mid  Maximum size: 3.4 cm; Other 2 dimensions: 3.2 x 2.9 cm, previously, 4.5 x 3.2 x 3.7 cm  Composition: solid/almost completely solid (2)  Echogenicity: hypoechoic (2)  Shape: not taller-than-wide (0)  Margins: smooth (0)  Echogenic foci: none (0)  ACR TI-RADS total points: 4.  ACR TI-RADS risk category:  TR4 (4-6 points).  Significant change in size (>/= 20% in two dimensions and minimal increase of 2 mm): No  Change in features: No  Change in ACR TI-RADS risk category: No  ACR TI-RADS recommendations:  Recommend correlation with prior biopsy results.  _________________________________________________________  Nodule # 3:  Prior biopsy: Yes  Location: Left; Superior  Maximum size: 1.4 cm; Other 2 dimensions: 0.7 x 0.9 cm, previously, 1.2 x 0.7 x 1.1 cm  Composition: solid/almost completely solid (2)  Echogenicity: hypoechoic (2)  Shape: not taller-than-wide (0)  Margins: ill-defined (0)  Echogenic foci: punctate echogenic foci (3)  ACR TI-RADS total points: 7.  ACR TI-RADS risk category:  TR5 (>/= 7 points).  Significant change in size (>/= 20% in two dimensions and minimal increase of 2 mm): No  Change in features: No  Change in ACR TI-RADS risk category: No  ACR TI-RADS recommendations:  Recommend correlation with prior biopsy results.  _________________________________________________________  Nodule # 4:  Prior biopsy: No  Location: Left; Inferior  Maximum size: 1.6 cm; Other 2 dimensions: 1.4 x 1.5 cm, previously, 1.7 x 1.4 x 1.5 cm  Composition: solid/almost completely solid (2)  Echogenicity: hypoechoic (2)  Shape: not taller-than-wide (0)  Margins: smooth (0)  Echogenic foci: none  (0)  ACR TI-RADS total points: 4.  ACR TI-RADS risk category:  TR4 (4-6 points).  Significant change in size (>/= 20% in two dimensions and minimal increase of 2 mm): No  Change in features: No  Change in ACR TI-RADS risk category: No  ACR TI-RADS recommendations:  Recommend correlation with prior biopsy results.  _________________________________________________________  IMPRESSION: Unchanged multinodular goiter. No significant change in previously visualized and biopsied thyroid nodules. No new thyroid nodules in the interim. No dedicated ultrasound follow-up or further tissue sampling is recommended.  The above is in keeping with the ACR TI-RADS recommendations - J Am Coll Radiol 2017;14:587-595.  Ruthann Cancer, MD  [facility]  [facility]   Electronically Signed   By: Ruthann Cancer MD   On: 06/17/2020 09:55  Assessment & Plan:   1. RAI induced hypothyroidism  - She is status post RAI thyroid ablation on January12,  2018. -Her previsit thyroid function tests are consistent with appropriate hormone replacement.  She is advised to continue Levothyroxine 25 mcg po daily before breakfast.    - We discussed about the correct intake of her thyroid hormone, on empty stomach at fasting, with water, separated by at least 30 minutes from breakfast and other medications,  and separated by more than 4 hours from calcium, iron, multivitamins, acid reflux medications (PPIs). -Patient is made aware of the fact that thyroid hormone replacement is needed for life, dose to be adjusted by periodic monitoring of thyroid function tests.  2. H/o Toxic multinodul goiter -Her recent thyroid US from 06/17/20 shows stable multinodular thyroid. No need for further surveillance at this time.  If she continues to have pains in right side of neck, she is advised to reach back out to PCP for other potential causes as her thyroid US  did not show anything that could be causing this symptom.    - I advised patient to maintain close follow up with Lucianne Lei, MD for primary care needs.       - Time spent on this patient care encounter:  30 minutes of which 50% was spent in  counseling and the rest reviewing  her current and  previous labs / studies and medications  doses and developing a plan for long term care, and documenting this care. [place]  participated in the discussions, expressed understanding, and voiced agreement with the above plans.  All questions were answered to her satisfaction. she is encouraged to contact clinic should she have any questions or concerns prior to her return visit.   Follow up plan: Return in about 6 months (around 01/05/2021) for Thyroid follow up, Previsit labs.  Rayetta Pigg, [facility] [address] [place], [place] [address] Phone: [phone] Fax: [phone]  07/08/2020, 8:25 AM

## 2020-07-08 NOTE — Patient Instructions (Signed)

## 2020-11-08 ENCOUNTER — Other Ambulatory Visit: Payer: Self-pay | Admitting: "Endocrinology

## 2020-12-09 ENCOUNTER — Other Ambulatory Visit (HOSPITAL_COMMUNITY): Payer: Self-pay | Admitting: Family Medicine

## 2020-12-09 DIAGNOSIS — L040 Acute lymphadenitis of face, head and neck: Secondary | ICD-10-CM

## 2020-12-12 LAB — TSH: TSH: 1.12 u[IU]/mL (ref 0.450–4.500)

## 2020-12-12 LAB — T4, FREE: Free T4: 1.26 ng/dL (ref 0.82–1.77)

## 2020-12-13 ENCOUNTER — Other Ambulatory Visit: Payer: Self-pay

## 2020-12-13 ENCOUNTER — Ambulatory Visit (HOSPITAL_COMMUNITY)
Admission: RE | Admit: 2020-12-13 | Discharge: 2020-12-13 | Disposition: A | Discharge status: Home / Self Care | Payer: BC Managed Care – PPO | Source: Ambulatory Visit | Attending: Family Medicine | Admitting: Family Medicine

## 2020-12-13 DIAGNOSIS — L040 Acute lymphadenitis of face, head and neck: Secondary | ICD-10-CM | POA: Diagnosis not present

## 2020-12-17 NOTE — Patient Instructions (Signed)

## 2020-12-18 ENCOUNTER — Other Ambulatory Visit: Payer: Self-pay

## 2020-12-18 ENCOUNTER — Ambulatory Visit: Payer: BC Managed Care – PPO | Admitting: Nurse Practitioner

## 2020-12-18 ENCOUNTER — Encounter: Payer: Self-pay | Admitting: Nurse Practitioner

## 2020-12-18 VITALS — BP 124/79 | HR 81 | Ht 66.0 in | Wt 190.0 lb

## 2020-12-18 DIAGNOSIS — E042 Nontoxic multinodular goiter: Secondary | ICD-10-CM | POA: Diagnosis not present

## 2020-12-18 DIAGNOSIS — E890 Postprocedural hypothyroidism: Secondary | ICD-10-CM

## 2020-12-18 MED ORDER — LEVOTHYROXINE SODIUM 25 MCG PO TABS: 25.0000 ug | ORAL_TABLET | Freq: Every day | ORAL | 3 refills | Status: DC

## 2020-12-18 NOTE — Progress Notes (Signed)
12/18/2020              Endocrinology follow-up note   Subjective:    Patient ID: Christine Poole, female    DOB: 12-12-1950, PCP Lucianne Lei, MD   Past Medical History:  Diagnosis Date   Arthritis    Glaucoma    Hyperplastic polyp of large intestine    Hypertension    Past Surgical History:  Procedure Laterality Date   ABDOMINAL HYSTERECTOMY     COLONOSCOPY N/A 08/08/2012   [id number] polyps and internal hemorrhoids.Status post removal rectal polyps   COLONOSCOPY N/A 04/16/2014   Procedure: COLONOSCOPY;  Surgeon: Daneil Dolin, MD;  Location: [facility];  Service: Endoscopy;  Laterality: N/A;  1:30pm   KNEE ARTHROSCOPY Left    Social History   Socioeconomic History   Marital status: Married    Spouse name: Not on file   Number of children: Not on file   Years of education: Not on file   Highest education level: Not on file  Occupational History   Not on file  Tobacco Use   Smoking status: Former    Packs/day: 0.00    Years: 40.00    Pack years: 0.00    Types: Cigarettes   Smokeless tobacco: Never  Vaping Use   Vaping Use: Never used  Substance and Sexual Activity   Alcohol use: No   Drug use: No   Sexual activity: Yes    Birth control/protection: Surgical  Other Topics Concern   Not on file  Social History Narrative   Not on file   Social Determinants of Health   Financial Resource Strain: Not on file  Food Insecurity: Not on file  Transportation Needs: Not on file  Physical Activity: Not on file  Stress: Not on file  Social Connections: Not on file   Outpatient Encounter Medications as of 12/18/2020  Medication Sig   aspirin 81 MG tablet Take 81 mg by mouth daily.   diltiazem (CARDIZEM) 90 MG tablet Take 90 mg by mouth 2 (two) times daily.   etodolac (LODINE) 400 MG tablet Take 400 mg by mouth 2 (two) times daily.   ezetimibe (ZETIA) 10 MG tablet daily.   JARDIANCE 10 MG TABS tablet Take 10 mg by mouth daily.   latanoprost  (XALATAN) 0.005 % ophthalmic solution 1 drop at bedtime.   levothyroxine (SYNTHROID) 25 MCG tablet Take 1 tablet (25 mcg total) by mouth daily before breakfast.   lisinopril-hydrochlorothiazide (PRINZIDE,ZESTORETIC) 20-25 MG per tablet Take 1 tablet by mouth daily.   Multiple Vitamin (MULTIVITAMIN) tablet Take 1 tablet by mouth daily.   rosuvastatin (CRESTOR) 40 MG tablet Take 40 mg by mouth daily.   [DISCONTINUED] JANUMET XR [phone] MG TB24 Take 1 tablet by mouth daily.   [DISCONTINUED] levothyroxine (SYNTHROID) 25 MCG tablet TAKE 1 TABLET(25 MCG) BY MOUTH DAILY BEFORE BREAKFAST   [DISCONTINUED] naproxen (NAPROSYN) 500 MG tablet Take 500 mg by mouth 2 (two) times daily with a meal.   [DISCONTINUED] potassium chloride SA (KLOR-CON) 20 MEQ tablet Take 40 mEq by mouth daily.   No facility-administered encounter medications on file as of 12/18/2020.   ALLERGIES: No Known Allergies VACCINATION STATUS:  There is no immunization history on file for this patient.  Thyroid Problem Presents for follow-up ( status post radioactive iodine therapy on May 22, 2016.Marland KitchenHer recent fine-needle aspiration was negative for malignancy.  Her previsit surveillance thyroid ultrasound is unremarkable.) visit. Patient reports no anxiety, cold intolerance, constipation, depressed  mood, diarrhea, fatigue, heat intolerance, leg swelling, palpitations, tremors, weight gain or weight loss. The symptoms have been stable.   - She has other comorbidities including high blood pressure and high cholesterol , she is not on a beta blocker. - She denies family history of thyroid problem. She denies any neck exposure to radiation.   Review of systems  Constitutional: + Minimally fluctuating body weight,  current Body mass index is 30.67 kg/m. , no fatigue, no subjective hyperthermia, no subjective hypothermia Eyes: no blurry vision, no xerophthalmia ENT: no sore throat, no nodules palpated in throat, no  dysphagia/odynophagia, no hoarseness Cardiovascular: no chest pain, no shortness of breath, no palpitations, no leg swelling Respiratory: no cough, no shortness of breath Gastrointestinal: no nausea/vomiting/diarrhea Musculoskeletal: no muscle/joint aches Skin: no rashes, no hyperemia Neurological: no tremors, no numbness, no tingling, no dizziness Psychiatric: no depression, no anxiety  Objective:    BP 124/79   Pulse 81   Ht '5\' 6"'$  (1.676 m)   Wt 190 lb (86.2 kg)   BMI 30.67 kg/m   Wt Readings from Last 3 Encounters:  12/18/20 190 lb (86.2 kg)  07/08/20 189 lb (85.7 kg)  03/11/20 188 lb (85.3 kg)    BP Readings from Last 3 Encounters:  12/18/20 124/79  07/08/20 118/76  03/11/20 121/76     Physical Exam- Limited  Constitutional:  Body mass index is 30.67 kg/m. , not in acute distress, normal state of mind Eyes:  EOMI, no exophthalmos Neck: Supple Cardiovascular: RRR, no murmurs, rubs, or gallops, no edema Respiratory: Adequate breathing efforts, no crackles, rales, rhonchi, or wheezing Musculoskeletal: no gross deformities, strength intact in all four extremities, no gross restriction of joint movements Skin:  no rashes, no hyperemia Neurological: no tremor with outstretched hands   Recent Results (from the past 2160 hour(s))  TSH     Status: None   Collection Time: 12/11/20 11:22 AM  Result Value Ref Range   TSH 1.120 0.450 - 4.500 uIU/mL  T4, free     Status: None   Collection Time: 12/11/20 11:22 AM  Result Value Ref Range   Free T4 1.26 0.82 - 1.77 ng/dL     Thyroid ultrasound from August 30, 2017 IMPRESSION: Right nodule 2 is stable and continues to meet criteria for fine needle aspiration biopsy.   Left nodule 3 is stable in size but has increased in TIRADS category. It meets criteria for fine needle aspiration biopsy.  Left lower nodule 4 has significantly increased in size and meets criteria for fine needle aspiration biopsy.  3 nodules on the  left lobe of her thyroid were biopsied with benign findings.  Latest thyroid ultrasound on 05/03/19 IMPRESSION: Thyroid nodules have minimally changed. No new suspicious thyroid nodules.  ------------------------------------------------------------------------------------------------------------------------------- US Thyroid 06/17/20 CLINICAL DATA:  Prior ultrasound follow-up.   EXAM: THYROID ULTRASOUND   TECHNIQUE: Ultrasound examination of the thyroid gland and adjacent soft tissues was performed.   COMPARISON:  05/03/2019   FINDINGS: Parenchymal Echotexture: Mildly heterogenous   Isthmus: 0.3 cm, previously 0.2 cm   Right lobe: 6.1 x 3.6 x 3.7 cm, previously 5.6 x 3.5 x 3.8 cm   Left lobe: 5.6 x 2.8 x 1.5 cm, previously 5.3 x 1.7 x 1.6 cm   _________________________________________________________   Estimated total number of nodules >/= 1 cm: 3   Number of spongiform nodules >/=  2 cm not described below (TR1): 0   Number of mixed cystic and solid nodules >/= 1.5 cm not described below (  [place]): 0   _________________________________________________________   Nodule # 1:   Prior biopsy: No   Location: Isthmus; Inferior, left   Maximum size: 0.9 cm; Other 2 dimensions: 0.7 x 0.8 cm, previously, 0.9 x 0.8 x 0.8 cm   Composition: solid/almost completely solid (2)   Echogenicity: hypoechoic (2)   Shape: not taller-than-wide (0)   Margins: ill-defined (0)   Echogenic foci: none (0)   ACR TI-RADS total points: 4.   ACR TI-RADS risk category:  TR4 (4-6 points).   Significant change in size (>/= 20% in two dimensions and minimal increase of 2 mm): No   Change in features: No   Change in ACR TI-RADS risk category: No   ACR TI-RADS recommendations:   Given size (<0.9 cm) and appearance, this nodule does NOT meet TI-RADS criteria for biopsy or dedicated follow-up.   _________________________________________________________   Nodule # 2:   Prior biopsy:  Yes   Location: Right; Mid   Maximum size: 3.4 cm; Other 2 dimensions: 3.2 x 2.9 cm, previously, 4.5 x 3.2 x 3.7 cm   Composition: solid/almost completely solid (2)   Echogenicity: hypoechoic (2)   Shape: not taller-than-wide (0)   Margins: smooth (0)   Echogenic foci: none (0)   ACR TI-RADS total points: 4.   ACR TI-RADS risk category:  TR4 (4-6 points).   Significant change in size (>/= 20% in two dimensions and minimal increase of 2 mm): No   Change in features: No   Change in ACR TI-RADS risk category: No   ACR TI-RADS recommendations:   Recommend correlation with prior biopsy results.   _________________________________________________________   Nodule # 3:   Prior biopsy: Yes   Location: Left; Superior   Maximum size: 1.4 cm; Other 2 dimensions: 0.7 x 0.9 cm, previously, 1.2 x 0.7 x 1.1 cm   Composition: solid/almost completely solid (2)   Echogenicity: hypoechoic (2)   Shape: not taller-than-wide (0)   Margins: ill-defined (0)   Echogenic foci: punctate echogenic foci (3)   ACR TI-RADS total points: 7.   ACR TI-RADS risk category:  TR5 (>/= 7 points).   Significant change in size (>/= 20% in two dimensions and minimal increase of 2 mm): No   Change in features: No   Change in ACR TI-RADS risk category: No   ACR TI-RADS recommendations:   Recommend correlation with prior biopsy results.   _________________________________________________________   Nodule # 4:   Prior biopsy: No   Location: Left; Inferior   Maximum size: 1.6 cm; Other 2 dimensions: 1.4 x 1.5 cm, previously, 1.7 x 1.4 x 1.5 cm   Composition: solid/almost completely solid (2)   Echogenicity: hypoechoic (2)   Shape: not taller-than-wide (0)   Margins: smooth (0)   Echogenic foci: none (0)   ACR TI-RADS total points: 4.   ACR TI-RADS risk category:  TR4 (4-6 points).   Significant change in size (>/= 20% in two dimensions and minimal increase of 2 mm):  No   Change in features: No   Change in ACR TI-RADS risk category: No   ACR TI-RADS recommendations:   Recommend correlation with prior biopsy results.   _________________________________________________________   IMPRESSION: Unchanged multinodular goiter. No significant change in previously visualized and biopsied thyroid nodules. No new thyroid nodules in the interim. No dedicated ultrasound follow-up or further tissue sampling is recommended.   The above is in keeping with the ACR TI-RADS recommendations - J Am Coll Radiol 2017;14:587-595.   Ruthann Cancer, MD  [facility]   [facility]     Electronically Signed   By: Ruthann Cancer MD   On: 06/17/2020 09:55  Results for JANHAVI, GUYE (MRN [id number]) as of 12/18/2020 08:30  Ref. Range 12/11/2020 11:22  TSH Latest Ref Range: 0.450 - 4.500 uIU/mL 1.120  T4,Free(Direct) Latest Ref Range: 0.82 - 1.77 ng/dL 1.26     Assessment & Plan:   1. RAI induced hypothyroidism  - She is status post RAI thyroid ablation on January12, 2018.  -Her previsit thyroid function tests are consistent with appropriate hormone replacement.  She is advised to continue Levothyroxine 25 mcg po daily before breakfast.    - We discussed about the correct intake of her thyroid hormone, on empty stomach at fasting, with water, separated by at least 30 minutes from breakfast and other medications,  and separated by more than 4 hours from calcium, iron, multivitamins, acid reflux medications (PPIs). -Patient is made aware of the fact that thyroid hormone replacement is needed for life, dose to be adjusted by periodic monitoring of thyroid function tests.  2. H/o Toxic multinodul goiter -Her recent thyroid US from 06/17/20 shows stable multinodular thyroid. No need for further surveillance at this time.  If she continues to have pains in right side of neck, she is advised to reach back out to PCP for  other potential causes as her thyroid US did not show anything that could be causing this symptom.  She did have ultrasound of her neck which showed mass, suspicious for subcutaneous lipoma.  She says she may need surgery in the future to remove this as it is getting bigger over time.    - I advised patient to maintain close follow up with Lucianne Lei, MD for primary care needs.     I spent 20 minutes in the care of the patient today including review of labs from Thyroid Function, CMP, and other relevant labs ; imaging/biopsy records (current and previous including abstractions from other facilities); face-to-face time discussing  her lab results and symptoms, medications doses, her options of short and long term treatment based on the latest standards of care / guidelines;   and documenting the encounter.  [place]  participated in the discussions, expressed understanding, and voiced agreement with the above plans.  All questions were answered to her satisfaction. she is encouraged to contact clinic should she have any questions or concerns prior to her return visit.   Follow up plan: Return in about 1 year (around 12/18/2021) for Thyroid follow up, Previsit labs.    Rayetta Pigg, [facility] [address] [place], [place] [address] Phone: [phone] Fax: [phone]  12/18/2020, 8:42 AM

## 2020-12-30 ENCOUNTER — Ambulatory Visit: Payer: BC Managed Care – PPO | Admitting: Nurse Practitioner

## 2021-06-05 ENCOUNTER — Other Ambulatory Visit (HOSPITAL_COMMUNITY): Payer: Self-pay | Admitting: Family Medicine

## 2021-06-05 DIAGNOSIS — Z1231 Encounter for screening mammogram for malignant neoplasm of breast: Secondary | ICD-10-CM

## 2021-07-07 ENCOUNTER — Ambulatory Visit (HOSPITAL_COMMUNITY)
Admission: RE | Admit: 2021-07-07 | Discharge: 2021-07-07 | Disposition: A | Discharge status: Home / Self Care | Payer: BC Managed Care – PPO | Source: Ambulatory Visit | Attending: Family Medicine | Admitting: Family Medicine

## 2021-07-07 ENCOUNTER — Other Ambulatory Visit: Payer: Self-pay

## 2021-07-07 DIAGNOSIS — Z1231 Encounter for screening mammogram for malignant neoplasm of breast: Secondary | ICD-10-CM | POA: Insufficient documentation

## 2021-12-15 ENCOUNTER — Other Ambulatory Visit: Payer: Self-pay

## 2021-12-15 DIAGNOSIS — E890 Postprocedural hypothyroidism: Secondary | ICD-10-CM

## 2021-12-16 LAB — T4, FREE: Free T4: 1.24 ng/dL (ref 0.82–1.77)

## 2021-12-16 LAB — TSH: TSH: 0.291 u[IU]/mL — ABNORMAL LOW (ref 0.450–4.500)

## 2021-12-22 ENCOUNTER — Encounter: Payer: Self-pay | Admitting: Nurse Practitioner

## 2021-12-22 ENCOUNTER — Ambulatory Visit (INDEPENDENT_AMBULATORY_CARE_PROVIDER_SITE_OTHER): Payer: BC Managed Care – PPO | Admitting: Nurse Practitioner

## 2021-12-22 VITALS — BP 119/78 | HR 80 | Ht 66.0 in | Wt 196.0 lb

## 2021-12-22 DIAGNOSIS — E890 Postprocedural hypothyroidism: Secondary | ICD-10-CM

## 2021-12-22 DIAGNOSIS — E042 Nontoxic multinodular goiter: Secondary | ICD-10-CM | POA: Diagnosis not present

## 2021-12-22 MED ORDER — LEVOTHYROXINE SODIUM 25 MCG PO TABS: 25.0000 ug | ORAL_TABLET | Freq: Every day | ORAL | 3 refills | Status: DC

## 2021-12-22 NOTE — Progress Notes (Signed)
12/22/2021              Endocrinology follow-up note   Subjective:    Patient ID: Christine Poole, female    DOB: 06/08/1950, PCP Lucianne Lei, MD   Past Medical History:  Diagnosis Date   Arthritis    Glaucoma    Hyperplastic polyp of large intestine    Hypertension    Past Surgical History:  Procedure Laterality Date   ABDOMINAL HYSTERECTOMY     COLONOSCOPY N/A 08/08/2012   [id number] polyps and internal hemorrhoids.Status post removal rectal polyps   COLONOSCOPY N/A 04/16/2014   Procedure: COLONOSCOPY;  Surgeon: Daneil Dolin, MD;  Location: [facility];  Service: Endoscopy;  Laterality: N/A;  1:30pm   KNEE ARTHROSCOPY Left    Social History   Socioeconomic History   Marital status: Married    Spouse name: Not on file   Number of children: Not on file   Years of education: Not on file   Highest education level: Not on file  Occupational History   Not on file  Tobacco Use   Smoking status: Former    Packs/day: 0.00    Years: 40.00    Total pack years: 0.00    Types: Cigarettes   Smokeless tobacco: Never  Vaping Use   Vaping Use: Never used  Substance and Sexual Activity   Alcohol use: No   Drug use: No   Sexual activity: Yes    Birth control/protection: Surgical  Other Topics Concern   Not on file  Social History Narrative   Not on file   Social Determinants of Health   Financial Resource Strain: Not on file  Food Insecurity: Not on file  Transportation Needs: Not on file  Physical Activity: Not on file  Stress: Not on file  Social Connections: Not on file   Outpatient Encounter Medications as of 12/22/2021  Medication Sig   aspirin 81 MG tablet Take 81 mg by mouth daily.   diltiazem (CARDIZEM) 90 MG tablet Take 90 mg by mouth 2 (two) times daily.   etodolac (LODINE) 400 MG tablet Take 400 mg by mouth 2 (two) times daily.   ezetimibe (ZETIA) 10 MG tablet daily.   JARDIANCE 10 MG TABS tablet Take 10 mg by mouth daily.   latanoprost  (XALATAN) 0.005 % ophthalmic solution 1 drop at bedtime.   levothyroxine (SYNTHROID) 25 MCG tablet Take 1 tablet (25 mcg total) by mouth daily before breakfast.   lisinopril-hydrochlorothiazide (PRINZIDE,ZESTORETIC) 20-25 MG per tablet Take 1 tablet by mouth daily.   Multiple Vitamin (MULTIVITAMIN) tablet Take 1 tablet by mouth daily.   rosuvastatin (CRESTOR) 40 MG tablet Take 40 mg by mouth daily.   [DISCONTINUED] levothyroxine (SYNTHROID) 25 MCG tablet Take 1 tablet (25 mcg total) by mouth daily before breakfast.   No facility-administered encounter medications on file as of 12/22/2021.   ALLERGIES: No Known Allergies VACCINATION STATUS:  There is no immunization history on file for this patient.  Thyroid Problem Presents for follow-up ( status post radioactive iodine therapy on May 22, 2016.Marland KitchenHer recent fine-needle aspiration was negative for malignancy.  Her previsit surveillance thyroid ultrasound is unremarkable.) visit. Patient reports no anxiety, cold intolerance, constipation, depressed mood, diarrhea, fatigue, heat intolerance, leg swelling, palpitations, tremors, weight gain or weight loss. The symptoms have been stable.    - She has other comorbidities including high blood pressure and high cholesterol , she is not on a beta blocker. - She denies family history  of thyroid problem. She denies any neck exposure to radiation.   Review of systems  Constitutional: + Minimally fluctuating body weight,  current Body mass index is 31.64 kg/m. , no fatigue, no subjective hyperthermia, no subjective hypothermia Eyes: no blurry vision, no xerophthalmia ENT: no sore throat, no nodules palpated in throat, no dysphagia/odynophagia, no hoarseness Cardiovascular: no chest pain, no shortness of breath, no palpitations, no leg swelling Respiratory: no cough, no shortness of breath Gastrointestinal: no nausea/vomiting/diarrhea Musculoskeletal: no muscle/joint aches Skin: no rashes, no  hyperemia Neurological: no tremors, no numbness, no tingling, no dizziness Psychiatric: no depression, no anxiety  Objective:    BP 119/78   Pulse 80   Ht '5\' 6"'$  (1.676 m)   Wt 196 lb (88.9 kg)   BMI 31.64 kg/m   Wt Readings from Last 3 Encounters:  12/22/21 196 lb (88.9 kg)  12/18/20 190 lb (86.2 kg)  07/08/20 189 lb (85.7 kg)    BP Readings from Last 3 Encounters:  12/22/21 119/78  12/18/20 124/79  07/08/20 118/76    Physical Exam- Limited  Constitutional:  Body mass index is 31.64 kg/m. , not in acute distress, normal state of mind Eyes:  EOMI, no exophthalmos Neck: Supple Cardiovascular: RRR, no murmurs, rubs, or gallops, no edema Respiratory: Adequate breathing efforts, no crackles, rales, rhonchi, or wheezing Musculoskeletal: no gross deformities, strength intact in all four extremities, no gross restriction of joint movements Skin:  no rashes, no hyperemia Neurological: no tremor with outstretched hands   Recent Results (from the past 2160 hour(s))  TSH     Status: Abnormal   Collection Time: 12/15/21  8:26 AM  Result Value Ref Range   TSH 0.291 (L) 0.450 - 4.500 uIU/mL  T4, free     Status: None   Collection Time: 12/15/21  8:26 AM  Result Value Ref Range   Free T4 1.24 0.82 - 1.77 ng/dL     Thyroid ultrasound from August 30, 2017 IMPRESSION: Right nodule 2 is stable and continues to meet criteria for fine needle aspiration biopsy.   Left nodule 3 is stable in size but has increased in TIRADS category. It meets criteria for fine needle aspiration biopsy.  Left lower nodule 4 has significantly increased in size and meets criteria for fine needle aspiration biopsy.  3 nodules on the left lobe of her thyroid were biopsied with benign findings.  Latest thyroid ultrasound on 05/03/19 IMPRESSION: Thyroid nodules have minimally changed. No new suspicious thyroid nodules.   ------------------------------------------------------------------------------------------------------------------------------- US Thyroid 06/17/20 CLINICAL DATA:  Prior ultrasound follow-up.   EXAM: THYROID ULTRASOUND   TECHNIQUE: Ultrasound examination of the thyroid gland and adjacent soft tissues was performed.   COMPARISON:  05/03/2019   FINDINGS: Parenchymal Echotexture: Mildly heterogenous   Isthmus: 0.3 cm, previously 0.2 cm   Right lobe: 6.1 x 3.6 x 3.7 cm, previously 5.6 x 3.5 x 3.8 cm   Left lobe: 5.6 x 2.8 x 1.5 cm, previously 5.3 x 1.7 x 1.6 cm   _________________________________________________________   Estimated total number of nodules >/= 1 cm: 3   Number of spongiform nodules >/=  2 cm not described below (TR1): 0   Number of mixed cystic and solid nodules >/= 1.5 cm not described below ([place]): 0   _________________________________________________________   Nodule # 1:   Prior biopsy: No   Location: Isthmus; Inferior, left   Maximum size: 0.9 cm; Other 2 dimensions: 0.7 x 0.8 cm, previously, 0.9 x 0.8 x 0.8 cm   Composition:  solid/almost completely solid (2)   Echogenicity: hypoechoic (2)   Shape: not taller-than-wide (0)   Margins: ill-defined (0)   Echogenic foci: none (0)   ACR TI-RADS total points: 4.   ACR TI-RADS risk category:  TR4 (4-6 points).   Significant change in size (>/= 20% in two dimensions and minimal increase of 2 mm): No   Change in features: No   Change in ACR TI-RADS risk category: No   ACR TI-RADS recommendations:   Given size (<0.9 cm) and appearance, this nodule does NOT meet TI-RADS criteria for biopsy or dedicated follow-up.   _________________________________________________________   Nodule # 2:   Prior biopsy: Yes   Location: Right; Mid   Maximum size: 3.4 cm; Other 2 dimensions: 3.2 x 2.9 cm, previously, 4.5 x 3.2 x 3.7 cm   Composition: solid/almost completely solid (2)   Echogenicity:  hypoechoic (2)   Shape: not taller-than-wide (0)   Margins: smooth (0)   Echogenic foci: none (0)   ACR TI-RADS total points: 4.   ACR TI-RADS risk category:  TR4 (4-6 points).   Significant change in size (>/= 20% in two dimensions and minimal increase of 2 mm): No   Change in features: No   Change in ACR TI-RADS risk category: No   ACR TI-RADS recommendations:   Recommend correlation with prior biopsy results.   _________________________________________________________   Nodule # 3:   Prior biopsy: Yes   Location: Left; Superior   Maximum size: 1.4 cm; Other 2 dimensions: 0.7 x 0.9 cm, previously, 1.2 x 0.7 x 1.1 cm   Composition: solid/almost completely solid (2)   Echogenicity: hypoechoic (2)   Shape: not taller-than-wide (0)   Margins: ill-defined (0)   Echogenic foci: punctate echogenic foci (3)   ACR TI-RADS total points: 7.   ACR TI-RADS risk category:  TR5 (>/= 7 points).   Significant change in size (>/= 20% in two dimensions and minimal increase of 2 mm): No   Change in features: No   Change in ACR TI-RADS risk category: No   ACR TI-RADS recommendations:   Recommend correlation with prior biopsy results.   _________________________________________________________   Nodule # 4:   Prior biopsy: No   Location: Left; Inferior   Maximum size: 1.6 cm; Other 2 dimensions: 1.4 x 1.5 cm, previously, 1.7 x 1.4 x 1.5 cm   Composition: solid/almost completely solid (2)   Echogenicity: hypoechoic (2)   Shape: not taller-than-wide (0)   Margins: smooth (0)   Echogenic foci: none (0)   ACR TI-RADS total points: 4.   ACR TI-RADS risk category:  TR4 (4-6 points).   Significant change in size (>/= 20% in two dimensions and minimal increase of 2 mm): No   Change in features: No   Change in ACR TI-RADS risk category: No   ACR TI-RADS recommendations:   Recommend correlation with prior biopsy results.    _________________________________________________________   IMPRESSION: Unchanged multinodular goiter. No significant change in previously visualized and biopsied thyroid nodules. No new thyroid nodules in the interim. No dedicated ultrasound follow-up or further tissue sampling is recommended.   The above is in keeping with the ACR TI-RADS recommendations - J Am Coll Radiol 2017;14:587-595.   Ruthann Cancer, MD   [facility]   [facility]     Electronically Signed   By: Ruthann Cancer MD   On: 06/17/2020 09:55   Latest Reference Range & Units 07/01/20 08:05 12/11/20 11:22 12/15/21 08:26  TSH 0.450 -  4.500 uIU/mL 0.493 1.120 0.291 (L)  T4,Free(Direct) 0.82 - 1.77 ng/dL 1.22 1.26 1.24  (L): Data is abnormally low    Assessment & Plan:   1. RAI induced hypothyroidism  - She is status post RAI thyroid ablation on January12, 2018.  -Her previsit thyroid function tests are consistent with appropriate hormone replacement.  She is advised to continue Levothyroxine 25 mcg po daily before breakfast.    - We discussed about the correct intake of her thyroid hormone, on empty stomach at fasting, with water, separated by at least 30 minutes from breakfast and other medications,  and separated by more than 4 hours from calcium, iron, multivitamins, acid reflux medications (PPIs). -Patient is made aware of the fact that thyroid hormone replacement is needed for life, dose to be adjusted by periodic monitoring of thyroid function tests.  2. H/o Toxic multinodul goiter -Her recent thyroid US from 06/17/20 shows stable multinodular thyroid. No need for further surveillance at this time.  If she continues to have pains in right side of neck, she is advised to reach back out to PCP for other potential causes as her thyroid US did not show anything that could be causing this symptom.  She did have ultrasound of her neck which showed mass, suspicious  for subcutaneous lipoma.  She says she may need surgery in the future to remove this as it is getting bigger over time.    - I advised patient to maintain close follow up with Lucianne Lei, MD for primary care needs.    I spent 20 minutes in the care of the patient today including review of labs from Thyroid Function, CMP, and other relevant labs ; imaging/biopsy records (current and previous including abstractions from other facilities); face-to-face time discussing  her lab results and symptoms, medications doses, her options of short and long term treatment based on the latest standards of care / guidelines;   and documenting the encounter.  [place]  participated in the discussions, expressed understanding, and voiced agreement with the above plans.  All questions were answered to her satisfaction. she is encouraged to contact clinic should she have any questions or concerns prior to her return visit.   Follow up plan: Return in about 1 year (around 12/23/2022) for Thyroid follow up, Previsit labs.    Rayetta Pigg, [facility] [address] [place], [place] [address] Phone: [phone] Fax: [phone]  12/22/2021, 8:48 AM

## 2021-12-22 NOTE — Patient Instructions (Signed)

## 2021-12-28 ENCOUNTER — Other Ambulatory Visit: Payer: Self-pay | Admitting: Nurse Practitioner

## 2021-12-28 DIAGNOSIS — E042 Nontoxic multinodular goiter: Secondary | ICD-10-CM

## 2021-12-28 DIAGNOSIS — E890 Postprocedural hypothyroidism: Secondary | ICD-10-CM

## 2022-06-09 LAB — COMPREHENSIVE METABOLIC PANEL: eGFR: 62

## 2022-06-09 LAB — BASIC METABOLIC PANEL
BUN: 22 — AB (ref 4–21)
Creatinine: 1 (ref 0.5–1.1)
Glucose: 142

## 2022-06-09 LAB — HEMOGLOBIN A1C: Hemoglobin A1C: 7.4

## 2022-06-09 LAB — TSH: TSH: 0.33 — AB (ref 0.41–5.90)

## 2022-06-10 ENCOUNTER — Other Ambulatory Visit (HOSPITAL_COMMUNITY): Payer: Self-pay | Admitting: Family Medicine

## 2022-06-10 DIAGNOSIS — Z1231 Encounter for screening mammogram for malignant neoplasm of breast: Secondary | ICD-10-CM

## 2022-07-13 ENCOUNTER — Ambulatory Visit (HOSPITAL_COMMUNITY)
Admission: RE | Admit: 2022-07-13 | Discharge: 2022-07-13 | Disposition: A | Discharge status: Home / Self Care | Payer: BC Managed Care – PPO | Source: Ambulatory Visit | Attending: Family Medicine | Admitting: Family Medicine

## 2022-07-13 DIAGNOSIS — Z1231 Encounter for screening mammogram for malignant neoplasm of breast: Secondary | ICD-10-CM | POA: Insufficient documentation

## 2022-09-08 LAB — BASIC METABOLIC PANEL
BUN: 16 (ref 4–21)
Creatinine: 0.8 (ref 0.5–1.1)
Glucose: 141

## 2022-09-08 LAB — LIPID PANEL
LDL Cholesterol: 72
Triglycerides: 76 (ref 40–160)

## 2022-09-08 LAB — LAB REPORT - SCANNED
A1c: 8.2
EGFR: 75

## 2022-09-08 LAB — COMPREHENSIVE METABOLIC PANEL: eGFR: 75

## 2022-09-08 LAB — TSH: TSH: 0.32 — AB (ref 0.41–5.90)

## 2022-09-08 LAB — HEMOGLOBIN A1C: Hemoglobin A1C: 8.2

## 2022-09-14 ENCOUNTER — Telehealth: Payer: Self-pay | Admitting: Nurse Practitioner

## 2022-09-14 NOTE — Telephone Encounter (Signed)
Called [facility] to see if Dr. Renaye Rakers wanted Christine Poole to take over Diabetes management.  Clinic stated that nurse would return call.

## 2022-09-16 ENCOUNTER — Telehealth: Payer: Self-pay | Admitting: Nurse Practitioner

## 2022-09-16 NOTE — Telephone Encounter (Signed)
Pts doctor would like you to see her for diabetes management too.  Do you want me to move her appt up?

## 2022-09-17 ENCOUNTER — Other Ambulatory Visit: Payer: Self-pay | Admitting: *Deleted

## 2022-09-17 ENCOUNTER — Telehealth: Payer: Self-pay | Admitting: Nurse Practitioner

## 2022-09-17 DIAGNOSIS — E890 Postprocedural hypothyroidism: Secondary | ICD-10-CM

## 2022-09-17 NOTE — Telephone Encounter (Signed)
Called pt to move appt up from 12/28/22.  Pt could only do Monday Mornings so new appt is 12/14/22.

## 2022-09-17 NOTE — Telephone Encounter (Signed)
Labs have been updated . 

## 2022-09-17 NOTE — Telephone Encounter (Signed)
You can move her up to the next available

## 2022-09-17 NOTE — Telephone Encounter (Signed)
Can someone update these labs?   

## 2022-12-10 NOTE — Telephone Encounter (Signed)
Thank you :)

## 2022-12-10 NOTE — Patient Instructions (Signed)

## 2022-12-14 ENCOUNTER — Encounter: Payer: Self-pay | Admitting: Nurse Practitioner

## 2022-12-14 ENCOUNTER — Ambulatory Visit: Payer: BC Managed Care – PPO | Admitting: Nurse Practitioner

## 2022-12-14 VITALS — BP 107/75 | HR 102 | Ht 66.0 in | Wt 183.6 lb

## 2022-12-14 DIAGNOSIS — Z7984 Long term (current) use of oral hypoglycemic drugs: Secondary | ICD-10-CM

## 2022-12-14 DIAGNOSIS — E042 Nontoxic multinodular goiter: Secondary | ICD-10-CM

## 2022-12-14 DIAGNOSIS — E1165 Type 2 diabetes mellitus with hyperglycemia: Secondary | ICD-10-CM | POA: Diagnosis not present

## 2022-12-14 DIAGNOSIS — E890 Postprocedural hypothyroidism: Secondary | ICD-10-CM | POA: Diagnosis not present

## 2022-12-14 LAB — POCT GLYCOSYLATED HEMOGLOBIN (HGB A1C): Hemoglobin A1C: 6.3 % — AB (ref 4.0–5.6)

## 2022-12-14 MED ORDER — JANUMET XR 100-1000 MG PO TB24: 1.0000 | ORAL_TABLET | Freq: Every day | ORAL | 1 refills | Status: DC

## 2022-12-14 MED ORDER — LEVOTHYROXINE SODIUM 25 MCG PO TABS: 25.0000 ug | ORAL_TABLET | Freq: Every day | ORAL | 3 refills | Status: DC

## 2022-12-14 MED ORDER — JARDIANCE 25 MG PO TABS: 25.0000 mg | ORAL_TABLET | Freq: Every day | ORAL | 1 refills | Status: DC

## 2022-12-14 NOTE — Progress Notes (Signed)
12/14/2022              Endocrinology follow-up note   Subjective:    Patient ID: Christine Poole, female    DOB: 1950/11/13, PCP Renaye Rakers, MD   Past Medical History:  Diagnosis Date   Arthritis    Glaucoma    Hyperplastic polyp of large intestine    Hypertension    Past Surgical History:  Procedure Laterality Date   ABDOMINAL HYSTERECTOMY     COLONOSCOPY N/A 08/08/2012   VHQ:IONGEX polyps and internal hemorrhoids.Status post removal rectal polyps   COLONOSCOPY N/A 04/16/2014   Procedure: COLONOSCOPY;  Surgeon: Corbin Ade, MD;  Location: [facility];  Service: Endoscopy;  Laterality: N/A;  1:30pm   KNEE ARTHROSCOPY Left    Social History   Socioeconomic History   Marital status: Married    Spouse name: Not on file   Number of children: Not on file   Years of education: Not on file   Highest education level: Not on file  Occupational History   Not on file  Tobacco Use   Smoking status: Former    Current packs/day: 0.00    Types: Cigarettes   Smokeless tobacco: Never  Vaping Use   Vaping status: Never Used  Substance and Sexual Activity   Alcohol use: No   Drug use: No   Sexual activity: Yes    Birth control/protection: Surgical  Other Topics Concern   Not on file  Social History Narrative   Not on file   Social Determinants of Health   Financial Resource Strain: Not on file  Food Insecurity: Not on file  Transportation Needs: Not on file  Physical Activity: Not on file  Stress: Not on file  Social Connections: Not on file   Outpatient Encounter Medications as of 12/14/2022  Medication Sig   aspirin 81 MG tablet Take 81 mg by mouth daily.   diltiazem (CARDIZEM) 90 MG tablet Take 90 mg by mouth 2 (two) times daily.   etodolac (LODINE) 400 MG tablet Take 400 mg by mouth 2 (two) times daily.   ezetimibe (ZETIA) 10 MG tablet daily.   latanoprost (XALATAN) 0.005 % ophthalmic solution 1 drop at bedtime.   lisinopril-hydrochlorothiazide  (PRINZIDE,ZESTORETIC) 20-25 MG per tablet Take 1 tablet by mouth daily.   Multiple Vitamin (MULTIVITAMIN) tablet Take 1 tablet by mouth daily.   rosuvastatin (CRESTOR) 40 MG tablet Take 40 mg by mouth daily.   [DISCONTINUED] JANUMET XR [phone] MG TB24 daily.   [DISCONTINUED] JARDIANCE 10 MG TABS tablet Take 10 mg by mouth daily.   [DISCONTINUED] JARDIANCE 25 MG TABS tablet Take 25 mg by mouth daily.   [DISCONTINUED] levothyroxine (SYNTHROID) 25 MCG tablet TAKE 1 TABLET BY MOUTH EVERY DAY BEFORE BREAKFAST   JANUMET XR [phone] MG TB24 Take 1 tablet by mouth daily.   JARDIANCE 25 MG TABS tablet Take 1 tablet (25 mg total) by mouth daily.   levothyroxine (SYNTHROID) 25 MCG tablet Take 1 tablet (25 mcg total) by mouth daily before breakfast.   No facility-administered encounter medications on file as of 12/14/2022.   ALLERGIES: No Known Allergies VACCINATION STATUS:  There is no immunization history on file for this patient.  Thyroid Problem Presents for follow-up ( status post radioactive iodine therapy on May 22, 2016.Marland KitchenHer recent fine-needle aspiration was negative for malignancy.  Her previsit surveillance thyroid ultrasound is unremarkable.) visit. Patient reports no anxiety, cold intolerance, constipation, depressed mood, diarrhea, fatigue, heat intolerance, leg swelling,  palpitations, tremors, weight gain or weight loss. The symptoms have been stable.  Diabetes She presents for her initial diabetic visit. She has type 2 diabetes mellitus. Onset time: Diagnosed at approx age of 69. Her disease course has been improving. Pertinent negatives for hypoglycemia include no nervousness/anxiousness or tremors. Associated symptoms include polyuria (on Jardiance). Pertinent negatives for diabetes include no fatigue and no weight loss. There are no hypoglycemic complications. Symptoms are stable. Diabetic complications include nephropathy. Risk factors for coronary artery disease include diabetes  mellitus, family history and hypertension. Current diabetic treatment includes oral agent (triple therapy) (Janumet 100/1000ER daily; Jardiance 25). Her weight is stable. She is following a generally healthy diet. Meal planning includes avoidance of concentrated sweets. She has not had a previous visit with a dietitian. She participates in exercise intermittently. (She presents today for her consultation for diabetes management with no meter or logs to review (does not check glucose at home-tends to forget).  She is seen in this clinic for thyroid condition but PCP requested we help with DM management as well.  She drinks mostly water or black coffee, eats 2-3 meals per day, rarely snacking.  She does not engage in routine physical activity outside of work (works night shift at [employer]).  She is UTD on eye exam, has never been to podiatry in the past.) An ACE inhibitor/angiotensin II receptor blocker is being taken. She does not see a podiatrist.Eye exam is current.    Review of systems  Constitutional: + Minimally fluctuating body weight,  current Body mass index is 29.63 kg/m. , no fatigue, no subjective hyperthermia, no subjective hypothermia Eyes: no blurry vision, no xerophthalmia ENT: no sore throat, no nodules palpated in throat, no dysphagia/odynophagia, no hoarseness Cardiovascular: no chest pain, no shortness of breath, no palpitations, no leg swelling Respiratory: no cough, no shortness of breath Gastrointestinal: no nausea/vomiting/diarrhea Musculoskeletal: no muscle/joint aches Skin: no rashes, no hyperemia Neurological: no tremors, no numbness, no tingling, no dizziness Psychiatric: no depression, no anxiety  Objective:    BP 107/75 (BP Location: Right Arm, Patient Position: Sitting, Cuff Size: Large)   Pulse (!) 102   Ht 5\' 6"  (1.676 m)   Wt 183 lb 9.6 oz (83.3 kg)   BMI 29.63 kg/m   Wt Readings from Last 3 Encounters:  12/14/22 183 lb 9.6 oz (83.3 kg)  12/22/21 196 lb (88.9  kg)  12/18/20 190 lb (86.2 kg)    BP Readings from Last 3 Encounters:  12/14/22 107/75  12/22/21 119/78  12/18/20 124/79     Physical Exam- Limited  Constitutional:  Body mass index is 29.63 kg/m. , not in acute distress, normal state of mind Eyes:  EOMI, no exophthalmos Musculoskeletal: no gross deformities, strength intact in all four extremities, no gross restriction of joint movements Skin:  no rashes, no hyperemia Neurological: no tremor with outstretched hands  Diabetic Foot Exam - Simple   No data filed     Recent Results (from the past 2160 hour(s))  T4, Free     Status: None   Collection Time: 12/07/22  8:45 AM  Result Value Ref Range   Free T4 1.29 0.82 - 1.77 ng/dL  TSH     Status: Abnormal   Collection Time: 12/07/22  8:45 AM  Result Value Ref Range   TSH 0.391 (L) 0.450 - 4.500 uIU/mL  HgB A1c     Status: Abnormal   Collection Time: 12/14/22  8:34 AM  Result Value Ref Range   Hemoglobin A1C  6.3 (A) 4.0 - 5.6 %   HbA1c POC (<> result, manual entry)     HbA1c, POC (prediabetic range)     HbA1c, POC (controlled diabetic range)       Thyroid ultrasound from August 30, 2017 IMPRESSION: Right nodule 2 is stable and continues to meet criteria for fine needle aspiration biopsy.   Left nodule 3 is stable in size but has increased in TIRADS category. It meets criteria for fine needle aspiration biopsy.  Left lower nodule 4 has significantly increased in size and meets criteria for fine needle aspiration biopsy.  3 nodules on the left lobe of her thyroid were biopsied with benign findings.  Latest thyroid ultrasound on 05/03/19 IMPRESSION: Thyroid nodules have minimally changed. No new suspicious thyroid nodules.  ------------------------------------------------------------------------------------------------------------------------------- US Thyroid 06/17/20 CLINICAL DATA:  Prior ultrasound follow-up.   EXAM: THYROID ULTRASOUND    TECHNIQUE: Ultrasound examination of the thyroid gland and adjacent soft tissues was performed.   COMPARISON:  05/03/2019   FINDINGS: Parenchymal Echotexture: Mildly heterogenous   Isthmus: 0.3 cm, previously 0.2 cm   Right lobe: 6.1 x 3.6 x 3.7 cm, previously 5.6 x 3.5 x 3.8 cm   Left lobe: 5.6 x 2.8 x 1.5 cm, previously 5.3 x 1.7 x 1.6 cm   _________________________________________________________   Estimated total number of nodules >/= 1 cm: 3   Number of spongiform nodules >/=  2 cm not described below (TR1): 0   Number of mixed cystic and solid nodules >/= 1.5 cm not described below (TR2): 0   _________________________________________________________   Nodule # 1:   Prior biopsy: No   Location: Isthmus; Inferior, left   Maximum size: 0.9 cm; Other 2 dimensions: 0.7 x 0.8 cm, previously, 0.9 x 0.8 x 0.8 cm   Composition: solid/almost completely solid (2)   Echogenicity: hypoechoic (2)   Shape: not taller-than-wide (0)   Margins: ill-defined (0)   Echogenic foci: none (0)   ACR TI-RADS total points: 4.   ACR TI-RADS risk category:  TR4 (4-6 points).   Significant change in size (>/= 20% in two dimensions and minimal increase of 2 mm): No   Change in features: No   Change in ACR TI-RADS risk category: No   ACR TI-RADS recommendations:   Given size (<0.9 cm) and appearance, this nodule does NOT meet TI-RADS criteria for biopsy or dedicated follow-up.   _________________________________________________________   Nodule # 2:   Prior biopsy: Yes   Location: Right; Mid   Maximum size: 3.4 cm; Other 2 dimensions: 3.2 x 2.9 cm, previously, 4.5 x 3.2 x 3.7 cm   Composition: solid/almost completely solid (2)   Echogenicity: hypoechoic (2)   Shape: not taller-than-wide (0)   Margins: smooth (0)   Echogenic foci: none (0)   ACR TI-RADS total points: 4.   ACR TI-RADS risk category:  TR4 (4-6 points).   Significant change in size (>/= 20%  in two dimensions and minimal increase of 2 mm): No   Change in features: No   Change in ACR TI-RADS risk category: No   ACR TI-RADS recommendations:   Recommend correlation with prior biopsy results.   _________________________________________________________   Nodule # 3:   Prior biopsy: Yes   Location: Left; Superior   Maximum size: 1.4 cm; Other 2 dimensions: 0.7 x 0.9 cm, previously, 1.2 x 0.7 x 1.1 cm   Composition: solid/almost completely solid (2)   Echogenicity: hypoechoic (2)   Shape: not taller-than-wide (0)   Margins: ill-defined (0)  Echogenic foci: punctate echogenic foci (3)   ACR TI-RADS total points: 7.   ACR TI-RADS risk category:  TR5 (>/= 7 points).   Significant change in size (>/= 20% in two dimensions and minimal increase of 2 mm): No   Change in features: No   Change in ACR TI-RADS risk category: No   ACR TI-RADS recommendations:   Recommend correlation with prior biopsy results.   _________________________________________________________   Nodule # 4:   Prior biopsy: No   Location: Left; Inferior   Maximum size: 1.6 cm; Other 2 dimensions: 1.4 x 1.5 cm, previously, 1.7 x 1.4 x 1.5 cm   Composition: solid/almost completely solid (2)   Echogenicity: hypoechoic (2)   Shape: not taller-than-wide (0)   Margins: smooth (0)   Echogenic foci: none (0)   ACR TI-RADS total points: 4.   ACR TI-RADS risk category:  TR4 (4-6 points).   Significant change in size (>/= 20% in two dimensions and minimal increase of 2 mm): No   Change in features: No   Change in ACR TI-RADS risk category: No   ACR TI-RADS recommendations:   Recommend correlation with prior biopsy results.   _________________________________________________________   IMPRESSION: Unchanged multinodular goiter. No significant change in previously visualized and biopsied thyroid nodules. No new thyroid nodules in the interim. No dedicated ultrasound  follow-up or further tissue sampling is recommended.   The above is in keeping with the ACR TI-RADS recommendations - J Am Coll Radiol 2017;14:587-595.   Marliss Coots, MD   [facility]   [facility]     Electronically Signed   By: Marliss Coots MD   On: 06/17/2020 09:55   Latest Reference Range & Units 10/30/19 07:28 03/04/20 07:26 07/01/20 08:05 12/11/20 11:22 12/15/21 08:26 12/07/22 08:45  TSH 0.450 - 4.500 uIU/mL 0.54 0.51 0.493 1.120 0.291 (L) 0.391 (L)  Triiodothyronine,Free,Serum 2.3 - 4.2 pg/mL 2.7       T4,Free(Direct) 0.82 - 1.77 ng/dL 1.3 1.3 8.41 3.24 4.01 1.29  (L): Data is abnormally low    Assessment & Plan:   1. RAI induced hypothyroidism  - She is status post RAI thyroid ablation on January12, 2018.  -Her previsit thyroid function tests are consistent with appropriate hormone replacement.  She is advised to continue Levothyroxine 25 mcg po daily before breakfast.    - We discussed about the correct intake of her thyroid hormone, on empty stomach at fasting, with water, separated by at least 30 minutes from breakfast and other medications,  and separated by more than 4 hours from calcium, iron, multivitamins, acid reflux medications (PPIs). -Patient is made aware of the fact that thyroid hormone replacement is needed for life, dose to be adjusted by periodic monitoring of thyroid function tests.  2. H/o Toxic multinodul goiter -Her recent thyroid US from 06/17/20 shows stable multinodular thyroid. No need for further surveillance at this time.  If she continues to have pains in right side of neck, she is advised to reach back out to PCP for other potential causes as her thyroid US did not show anything that could be causing this symptom.  She did have ultrasound of her neck which showed mass, suspicious for subcutaneous lipoma.  She says she may need surgery in the future to remove this as it is getting bigger over  time.  3. Type 2 Diabetes mellitus with hyperglycemia without long-term current use of insulin  She presents today for her consultation for diabetes management with no  meter or logs to review (does not check glucose at home-tends to forget).  She is seen in this clinic for thyroid condition but PCP requested we help with DM management as well.  She drinks mostly water or black coffee, eats 2-3 meals per day, rarely snacking.  She does not engage in routine physical activity outside of work (works night shift at [employer]).  She is UTD on eye exam, has never been to podiatry in the past.  Her POCT A1c today is 6.3%, improving greatly from last A1c of 8.2%.  - Nutritional counseling repeated at each appointment due to patients tendency to fall back in to old habits.  - The patient admits there is a room for improvement in their diet and drink choices. -  Suggestion is made for the patient to avoid simple carbohydrates from their diet including Cakes, Sweet Desserts / Pastries, Ice Cream, Soda (diet and regular), Sweet Tea, Candies, Chips, Cookies, Sweet Pastries, Store Bought Juices, Alcohol in Excess of 1-2 drinks a day, Artificial Sweeteners, Coffee Creamer, and "Sugar-free" Products. This will help patient to have stable blood glucose profile and potentially avoid unintended weight gain.   - I encouraged the patient to switch to unprocessed or minimally processed complex starch and increased protein intake (animal or plant source), fruits, and vegetables.   - Patient is advised to stick to a routine mealtimes to eat 3 meals a day and avoid unnecessary snacks (to snack only to correct hypoglycemia).  Given her good control on her current medications, no changes will be made today.  She is advised to continue Jardiance 25 mg po daily and Janumet 100/1000 mg ER daily with breakfast.  May try de-escalating her treatment at next visit if she maintains this control.  -She does not have to routinely monitor  glucose at this time due to safe regimen.  I did encourage her to start checking 2-3 times per week just for her to know what her glucose is trending.  - I advised patient to maintain close follow up with Renaye Rakers, MD for primary care needs.     I spent  51  minutes in the care of the patient today including review of labs from CMP, Lipids, Thyroid Function, Hematology (current and previous including abstractions from other facilities); face-to-face time discussing  her blood glucose readings/logs, discussing hypoglycemia and hyperglycemia episodes and symptoms, medications doses, her options of short and long term treatment based on the latest standards of care / guidelines;  discussion about incorporating lifestyle medicine;  and documenting the encounter. Risk reduction counseling performed per USPSTF guidelines to reduce obesity and cardiovascular risk factors.     Please refer to Patient Instructions for Blood Glucose Monitoring and Insulin/Medications Dosing Guide"  in media tab for additional information. Please  also refer to " Patient Self Inventory" in the Media  tab for reviewed elements of pertinent patient history.  Dauna R Vanriper participated in the discussions, expressed understanding, and voiced agreement with the above plans.  All questions were answered to her satisfaction. she is encouraged to contact clinic should she have any questions or concerns prior to her return visit.   Follow up plan: Return in about 4 months (around 04/15/2023) for Diabetes F/U with A1c in office, No previsit labs.   Ronny Bacon, [facility] [address] [place], [place] [address] Phone: [phone] Fax: [phone]  12/14/2022, 8:58 AM

## 2022-12-28 ENCOUNTER — Ambulatory Visit: Payer: BC Managed Care – PPO | Admitting: Nurse Practitioner

## 2023-02-08 ENCOUNTER — Inpatient Hospital Stay (HOSPITAL_COMMUNITY): Payer: BC Managed Care – PPO

## 2023-02-08 ENCOUNTER — Inpatient Hospital Stay (HOSPITAL_COMMUNITY)
Admission: EM | Admit: 2023-02-08 | Discharge: 2023-02-21 | DRG: 853 | Disposition: A | Discharge status: Hospice | Payer: BC Managed Care – PPO | Attending: Family Medicine | Admitting: Family Medicine

## 2023-02-08 ENCOUNTER — Emergency Department (HOSPITAL_COMMUNITY): Payer: BC Managed Care – PPO

## 2023-02-08 ENCOUNTER — Encounter (HOSPITAL_COMMUNITY): Payer: Self-pay | Admitting: Radiology

## 2023-02-08 ENCOUNTER — Other Ambulatory Visit: Payer: Self-pay

## 2023-02-08 DIAGNOSIS — E43 Unspecified severe protein-calorie malnutrition: Secondary | ICD-10-CM | POA: Diagnosis present

## 2023-02-08 DIAGNOSIS — L8990 Pressure ulcer of unspecified site, unspecified stage: Secondary | ICD-10-CM | POA: Insufficient documentation

## 2023-02-08 DIAGNOSIS — Z66 Do not resuscitate: Secondary | ICD-10-CM | POA: Diagnosis not present

## 2023-02-08 DIAGNOSIS — I2699 Other pulmonary embolism without acute cor pulmonale: Secondary | ICD-10-CM | POA: Diagnosis not present

## 2023-02-08 DIAGNOSIS — E869 Volume depletion, unspecified: Secondary | ICD-10-CM | POA: Diagnosis present

## 2023-02-08 DIAGNOSIS — M1990 Unspecified osteoarthritis, unspecified site: Secondary | ICD-10-CM | POA: Diagnosis present

## 2023-02-08 DIAGNOSIS — C786 Secondary malignant neoplasm of retroperitoneum and peritoneum: Secondary | ICD-10-CM | POA: Diagnosis present

## 2023-02-08 DIAGNOSIS — J181 Lobar pneumonia, unspecified organism: Secondary | ICD-10-CM

## 2023-02-08 DIAGNOSIS — C7951 Secondary malignant neoplasm of bone: Secondary | ICD-10-CM | POA: Diagnosis present

## 2023-02-08 DIAGNOSIS — Z7984 Long term (current) use of oral hypoglycemic drugs: Secondary | ICD-10-CM

## 2023-02-08 DIAGNOSIS — E11649 Type 2 diabetes mellitus with hypoglycemia without coma: Secondary | ICD-10-CM | POA: Diagnosis not present

## 2023-02-08 DIAGNOSIS — Z7982 Long term (current) use of aspirin: Secondary | ICD-10-CM

## 2023-02-08 DIAGNOSIS — E890 Postprocedural hypothyroidism: Secondary | ICD-10-CM | POA: Diagnosis present

## 2023-02-08 DIAGNOSIS — Z85819 Personal history of malignant neoplasm of unspecified site of lip, oral cavity, and pharynx: Secondary | ICD-10-CM

## 2023-02-08 DIAGNOSIS — Z90710 Acquired absence of both cervix and uterus: Secondary | ICD-10-CM

## 2023-02-08 DIAGNOSIS — C7971 Secondary malignant neoplasm of right adrenal gland: Secondary | ICD-10-CM | POA: Diagnosis present

## 2023-02-08 DIAGNOSIS — L89312 Pressure ulcer of right buttock, stage 2: Secondary | ICD-10-CM | POA: Diagnosis not present

## 2023-02-08 DIAGNOSIS — K668 Other specified disorders of peritoneum: Secondary | ICD-10-CM

## 2023-02-08 DIAGNOSIS — J189 Pneumonia, unspecified organism: Secondary | ICD-10-CM | POA: Diagnosis present

## 2023-02-08 DIAGNOSIS — C3411 Malignant neoplasm of upper lobe, right bronchus or lung: Secondary | ICD-10-CM | POA: Diagnosis present

## 2023-02-08 DIAGNOSIS — E876 Hypokalemia: Secondary | ICD-10-CM | POA: Diagnosis present

## 2023-02-08 DIAGNOSIS — C772 Secondary and unspecified malignant neoplasm of intra-abdominal lymph nodes: Secondary | ICD-10-CM | POA: Diagnosis present

## 2023-02-08 DIAGNOSIS — E222 Syndrome of inappropriate secretion of antidiuretic hormone: Secondary | ICD-10-CM | POA: Diagnosis not present

## 2023-02-08 DIAGNOSIS — C799 Secondary malignant neoplasm of unspecified site: Secondary | ICD-10-CM | POA: Diagnosis not present

## 2023-02-08 DIAGNOSIS — D72829 Elevated white blood cell count, unspecified: Secondary | ICD-10-CM | POA: Diagnosis not present

## 2023-02-08 DIAGNOSIS — I10 Essential (primary) hypertension: Secondary | ICD-10-CM | POA: Diagnosis present

## 2023-02-08 DIAGNOSIS — H409 Unspecified glaucoma: Secondary | ICD-10-CM | POA: Diagnosis present

## 2023-02-08 DIAGNOSIS — Z860100 Personal history of colon polyps, unspecified: Secondary | ICD-10-CM

## 2023-02-08 DIAGNOSIS — Z833 Family history of diabetes mellitus: Secondary | ICD-10-CM

## 2023-02-08 DIAGNOSIS — L89322 Pressure ulcer of left buttock, stage 2: Secondary | ICD-10-CM | POA: Diagnosis not present

## 2023-02-08 DIAGNOSIS — M84511A Pathological fracture in neoplastic disease, right shoulder, initial encounter for fracture: Secondary | ICD-10-CM | POA: Diagnosis present

## 2023-02-08 DIAGNOSIS — E785 Hyperlipidemia, unspecified: Secondary | ICD-10-CM | POA: Insufficient documentation

## 2023-02-08 DIAGNOSIS — K631 Perforation of intestine (nontraumatic): Secondary | ICD-10-CM | POA: Diagnosis present

## 2023-02-08 DIAGNOSIS — C3490 Malignant neoplasm of unspecified part of unspecified bronchus or lung: Secondary | ICD-10-CM | POA: Insufficient documentation

## 2023-02-08 DIAGNOSIS — Z6826 Body mass index (BMI) 26.0-26.9, adult: Secondary | ICD-10-CM

## 2023-02-08 DIAGNOSIS — Z79890 Hormone replacement therapy (postmenopausal): Secondary | ICD-10-CM

## 2023-02-08 DIAGNOSIS — Z515 Encounter for palliative care: Secondary | ICD-10-CM | POA: Diagnosis not present

## 2023-02-08 DIAGNOSIS — R918 Other nonspecific abnormal finding of lung field: Secondary | ICD-10-CM

## 2023-02-08 DIAGNOSIS — D72823 Leukemoid reaction: Secondary | ICD-10-CM | POA: Diagnosis present

## 2023-02-08 DIAGNOSIS — Z8349 Family history of other endocrine, nutritional and metabolic diseases: Secondary | ICD-10-CM

## 2023-02-08 DIAGNOSIS — A419 Sepsis, unspecified organism: Secondary | ICD-10-CM | POA: Diagnosis present

## 2023-02-08 DIAGNOSIS — Z87891 Personal history of nicotine dependence: Secondary | ICD-10-CM

## 2023-02-08 DIAGNOSIS — Z8719 Personal history of other diseases of the digestive system: Secondary | ICD-10-CM

## 2023-02-08 DIAGNOSIS — R4781 Slurred speech: Secondary | ICD-10-CM | POA: Diagnosis not present

## 2023-02-08 DIAGNOSIS — R110 Nausea: Secondary | ICD-10-CM | POA: Diagnosis present

## 2023-02-08 DIAGNOSIS — Z79899 Other long term (current) drug therapy: Secondary | ICD-10-CM

## 2023-02-08 DIAGNOSIS — Z8249 Family history of ischemic heart disease and other diseases of the circulatory system: Secondary | ICD-10-CM

## 2023-02-08 LAB — COMPREHENSIVE METABOLIC PANEL
ALT: 19 U/L (ref 0–44)
AST: 16 U/L (ref 15–41)
Albumin: 2.7 g/dL — ABNORMAL LOW (ref 3.5–5.0)
Alkaline Phosphatase: 155 U/L — ABNORMAL HIGH (ref 38–126)
Anion gap: 19 — ABNORMAL HIGH (ref 5–15)
BUN: 17 mg/dL (ref 8–23)
CO2: 17 mmol/L — ABNORMAL LOW (ref 22–32)
Calcium: 8.7 mg/dL — ABNORMAL LOW (ref 8.9–10.3)
Chloride: 98 mmol/L (ref 98–111)
Creatinine, Ser: 0.9 mg/dL (ref 0.44–1.00)
GFR, Estimated: >60 mL/min (ref >60)
Glucose, Bld: 109 mg/dL — ABNORMAL HIGH (ref 70–99)
Potassium: 3.3 mmol/L — ABNORMAL LOW (ref 3.5–5.1)
Sodium: 134 mmol/L — ABNORMAL LOW (ref 135–145)
Total Bilirubin: 1 mg/dL (ref 0.3–1.2)
Total Protein: 7.3 g/dL (ref 6.5–8.1)

## 2023-02-08 LAB — CBC WITH DIFFERENTIAL/PLATELET
Abs Immature Granulocytes: 1.01 10*3/uL — ABNORMAL HIGH (ref 0.00–0.07)
Basophils Absolute: 0.1 10*3/uL (ref 0.0–0.1)
Basophils Relative: 0 %
Eosinophils Absolute: 0 10*3/uL (ref 0.0–0.5)
Eosinophils Relative: 0 %
HCT: 34.6 % — ABNORMAL LOW (ref 36.0–46.0)
Hemoglobin: 11 g/dL — ABNORMAL LOW (ref 12.0–15.0)
Immature Granulocytes: 3 %
Lymphocytes Relative: 5 %
Lymphs Abs: 1.8 10*3/uL (ref 0.7–4.0)
MCH: 26.3 pg (ref 26.0–34.0)
MCHC: 31.8 g/dL (ref 30.0–36.0)
MCV: 82.6 fL (ref 80.0–100.0)
Monocytes Absolute: 1.3 10*3/uL — ABNORMAL HIGH (ref 0.1–1.0)
Monocytes Relative: 3 %
Neutro Abs: 34.6 10*3/uL — ABNORMAL HIGH (ref 1.7–7.7)
Neutrophils Relative %: 89 %
Platelets: 461 10*3/uL — ABNORMAL HIGH (ref 150–400)
RBC: 4.19 MIL/uL (ref 3.87–5.11)
RDW: 16.8 % — ABNORMAL HIGH (ref 11.5–15.5)
WBC: 38.8 10*3/uL — ABNORMAL HIGH (ref 4.0–10.5)
nRBC: 0 % (ref 0.0–0.2)

## 2023-02-08 LAB — APTT: aPTT: 32 s (ref 24–36)

## 2023-02-08 LAB — I-STAT CHEM 8, ED
BUN: 15 mg/dL (ref 8–23)
Calcium, Ion: 1.13 mmol/L — ABNORMAL LOW (ref 1.15–1.40)
Chloride: 99 mmol/L (ref 98–111)
Creatinine, Ser: 0.6 mg/dL (ref 0.44–1.00)
Glucose, Bld: 104 mg/dL — ABNORMAL HIGH (ref 70–99)
HCT: 35 % — ABNORMAL LOW (ref 36.0–46.0)
Hemoglobin: 11.9 g/dL — ABNORMAL LOW (ref 12.0–15.0)
Potassium: 3.6 mmol/L (ref 3.5–5.1)
Sodium: 134 mmol/L — ABNORMAL LOW (ref 135–145)
TCO2: 17 mmol/L — ABNORMAL LOW (ref 22–32)

## 2023-02-08 LAB — ETHANOL: Alcohol, Ethyl (B): <10 mg/dL (ref <10)

## 2023-02-08 LAB — PROTIME-INR
INR: 1.2 (ref 0.8–1.2)
Prothrombin Time: 15.2 s (ref 11.4–15.2)

## 2023-02-08 LAB — LIPASE, BLOOD: Lipase: 31 U/L (ref 11–51)

## 2023-02-08 LAB — LACTIC ACID, PLASMA: Lactic Acid, Venous: 1.5 mmol/L (ref 0.5–1.9)

## 2023-02-08 LAB — CBG MONITORING, ED: Glucose-Capillary: 99 mg/dL (ref 70–99)

## 2023-02-08 LAB — TSH: TSH: 1.104 u[IU]/mL (ref 0.350–4.500)

## 2023-02-08 MED ORDER — ONDANSETRON HCL 4 MG/2ML IJ SOLN
4.0000 mg | Freq: Four times a day (QID) | INTRAMUSCULAR | Status: DC | PRN
  Administered 2023-02-11 – 2023-02-13 (×3): 4 mg via INTRAVENOUS
  Filled 2023-02-08 (×2): qty 2

## 2023-02-08 MED ORDER — SODIUM CHLORIDE 0.9 % IV SOLN
500.0000 mg | INTRAVENOUS | Status: DC
  Administered 2023-02-09: 500 mg via INTRAVENOUS
  Filled 2023-02-08: qty 5

## 2023-02-08 MED ORDER — ASPIRIN 81 MG PO TBEC
81.0000 mg | DELAYED_RELEASE_TABLET | Freq: Every day | ORAL | Status: DC
  Administered 2023-02-09 – 2023-02-12 (×4): 81 mg via ORAL
  Filled 2023-02-08 (×5): qty 1

## 2023-02-08 MED ORDER — IOHEXOL 350 MG/ML SOLN
75.0000 mL | Freq: Once | INTRAVENOUS | Status: AC | PRN
  Administered 2023-02-08: 75 mL via INTRAVENOUS

## 2023-02-08 MED ORDER — ACETAMINOPHEN 650 MG RE SUPP: 650.0000 mg | Freq: Four times a day (QID) | RECTAL | Status: DC | PRN

## 2023-02-08 MED ORDER — EZETIMIBE 10 MG PO TABS
10.0000 mg | ORAL_TABLET | Freq: Every day | ORAL | Status: DC
  Administered 2023-02-09 – 2023-02-12 (×4): 10 mg via ORAL
  Filled 2023-02-08 (×5): qty 1

## 2023-02-08 MED ORDER — SODIUM CHLORIDE 0.9 % IV SOLN
1.0000 g | Freq: Once | INTRAVENOUS | Status: AC
  Administered 2023-02-08: 1 g via INTRAVENOUS
  Filled 2023-02-08: qty 10

## 2023-02-08 MED ORDER — SODIUM CHLORIDE 0.9 % IV SOLN: INTRAVENOUS | Status: DC

## 2023-02-08 MED ORDER — LISINOPRIL 10 MG PO TABS
20.0000 mg | ORAL_TABLET | Freq: Every day | ORAL | Status: DC
  Administered 2023-02-09 – 2023-02-10 (×2): 20 mg via ORAL
  Filled 2023-02-08 (×2): qty 2

## 2023-02-08 MED ORDER — SODIUM CHLORIDE 0.9 % IV SOLN
2.0000 g | INTRAVENOUS | Status: DC
  Administered 2023-02-09: 2 g via INTRAVENOUS
  Filled 2023-02-08: qty 20

## 2023-02-08 MED ORDER — DILTIAZEM HCL 60 MG PO TABS
90.0000 mg | ORAL_TABLET | Freq: Two times a day (BID) | ORAL | Status: DC
  Administered 2023-02-09 – 2023-02-13 (×10): 90 mg via ORAL
  Filled 2023-02-08 (×9): qty 1

## 2023-02-08 MED ORDER — LEVOTHYROXINE SODIUM 25 MCG PO TABS
25.0000 ug | ORAL_TABLET | Freq: Every day | ORAL | Status: DC
  Administered 2023-02-09 – 2023-02-13 (×5): 25 ug via ORAL
  Filled 2023-02-08 (×5): qty 1

## 2023-02-08 MED ORDER — ACETAMINOPHEN 325 MG PO TABS
650.0000 mg | ORAL_TABLET | Freq: Four times a day (QID) | ORAL | Status: DC | PRN
  Administered 2023-02-12: 650 mg via ORAL
  Filled 2023-02-08: qty 2

## 2023-02-08 MED ORDER — MORPHINE SULFATE (PF) 2 MG/ML IV SOLN
2.0000 mg | INTRAVENOUS | Status: DC | PRN
  Administered 2023-02-12 – 2023-02-16 (×15): 2 mg via INTRAVENOUS
  Filled 2023-02-08 (×16): qty 1

## 2023-02-08 MED ORDER — ROSUVASTATIN CALCIUM 20 MG PO TABS
40.0000 mg | ORAL_TABLET | Freq: Every day | ORAL | Status: DC
  Administered 2023-02-09 – 2023-02-12 (×4): 40 mg via ORAL
  Filled 2023-02-08 (×6): qty 2

## 2023-02-08 MED ORDER — LISINOPRIL-HYDROCHLOROTHIAZIDE 20-25 MG PO TABS: 1.0000 | ORAL_TABLET | Freq: Every day | ORAL | Status: DC

## 2023-02-08 MED ORDER — HEPARIN SODIUM (PORCINE) 5000 UNIT/ML IJ SOLN
5000.0000 [IU] | Freq: Three times a day (TID) | INTRAMUSCULAR | Status: DC
  Administered 2023-02-09 – 2023-02-10 (×6): 5000 [IU] via SUBCUTANEOUS
  Filled 2023-02-08 (×6): qty 1

## 2023-02-08 MED ORDER — ONDANSETRON HCL 4 MG PO TABS: 4.0000 mg | ORAL_TABLET | Freq: Four times a day (QID) | ORAL | Status: DC | PRN

## 2023-02-08 MED ORDER — HYDROCHLOROTHIAZIDE 25 MG PO TABS
25.0000 mg | ORAL_TABLET | Freq: Every day | ORAL | Status: DC
  Administered 2023-02-09 – 2023-02-10 (×2): 25 mg via ORAL
  Filled 2023-02-08 (×2): qty 1

## 2023-02-08 MED ORDER — POTASSIUM CHLORIDE 20 MEQ PO PACK
40.0000 meq | PACK | Freq: Once | ORAL | Status: AC
  Administered 2023-02-09: 40 meq via ORAL
  Filled 2023-02-08: qty 2

## 2023-02-08 MED ORDER — SODIUM CHLORIDE 0.9 % IV SOLN
500.0000 mg | Freq: Once | INTRAVENOUS | Status: AC
  Administered 2023-02-08: 500 mg via INTRAVENOUS
  Filled 2023-02-08: qty 5

## 2023-02-08 MED ORDER — DILTIAZEM HCL 60 MG PO TABS
90.0000 mg | ORAL_TABLET | Freq: Once | ORAL | Status: AC
  Administered 2023-02-09: 90 mg via ORAL
  Filled 2023-02-08: qty 1

## 2023-02-08 MED ORDER — OXYCODONE HCL 5 MG PO TABS: 5.0000 mg | ORAL_TABLET | ORAL | Status: DC | PRN

## 2023-02-08 NOTE — ED Notes (Signed)
Code stroke paged out at 1634.

## 2023-02-08 NOTE — ED Provider Notes (Addendum)
[place] EMERGENCY DEPARTMENT AT [facility] Provider Note   CSN: [id number] Arrival date & time: 02/08/23  1559  An emergency department physician performed an initial assessment on this suspected stroke patient at 1636.  History  Chief Complaint  Patient presents with   Emesis   Weakness    Christine Poole is a 72 y.o. female.  Patient brought in by EMS for the complaint of some weakness nausea and right shoulder pain the right shoulder pain was 2 weeks.  Family states patient vomited last night.  Patient also complaining of shortness of breath and had some coughing.  Shortly after arrival here reviewed about 1615 patient's speech became very slurred.  It was witnessed by the paramedic up nurse provider for the patient.  I was contacted immediately and speech was definitely off.  Code stroke was activated.  No family members were present at that time but we did get a hold of some family members.  It sounds as if speech was not off at home best we can tell.  Patient not seeming to have any other focal neurodeficits.       Home Medications Prior to Admission medications   Medication Sig Start Date End Date Taking? Authorizing Provider  aspirin 81 MG tablet Take 81 mg by mouth daily.    [provider]  diltiazem (CARDIZEM) 90 MG tablet Take 90 mg by mouth 2 (two) times daily.    [provider]  etodolac (LODINE) 400 MG tablet Take 400 mg by mouth 2 (two) times daily.    [provider]  ezetimibe (ZETIA) 10 MG tablet daily. 05/31/17   [provider]  JANUMET XR [phone] MG TB24 Take 1 tablet by mouth daily. 12/14/22   Dani Gobble, NP  JARDIANCE 25 MG TABS tablet Take 1 tablet (25 mg total) by mouth daily. 12/14/22   Dani Gobble, NP  latanoprost (XALATAN) 0.005 % ophthalmic solution 1 drop at bedtime.    [provider]  levothyroxine (SYNTHROID) 25 MCG tablet Take 1 tablet (25 mcg total) by mouth daily before  breakfast. 12/14/22   Dani Gobble, NP  lisinopril-hydrochlorothiazide (PRINZIDE,ZESTORETIC) 20-25 MG per tablet Take 1 tablet by mouth daily.    [provider]  Multiple Vitamin (MULTIVITAMIN) tablet Take 1 tablet by mouth daily.    [provider]  PREVIDENT 5000 ENAMEL PROTECT 1.1-5 % GEL Take 1 Application by mouth daily. Use on teeth once a day. 11/11/22   [provider]  rosuvastatin (CRESTOR) 40 MG tablet Take 40 mg by mouth daily. 02/29/20   [provider]      Allergies    Patient has no known allergies.    Review of Systems   Review of Systems  Constitutional:  Negative for chills and fever.  HENT:  Negative for ear pain and sore throat.   Eyes:  Negative for pain and visual disturbance.  Respiratory:  Positive for cough and shortness of breath.   Cardiovascular:  Negative for chest pain and palpitations.  Gastrointestinal:  Positive for nausea and vomiting. Negative for abdominal pain.  Genitourinary:  Negative for dysuria and hematuria.  Musculoskeletal:  Negative for arthralgias and back pain.  Skin:  Negative for color change and rash.  Neurological:  Positive for speech difficulty. Negative for seizures and syncope.  All other systems reviewed and are negative.   Physical Exam Updated Vital Signs BP (!) 140/90   Pulse (!) 119   Temp 98.8 F (  37.1 C) (Oral)   Resp (!) 27   Ht 1.676 m (5\' 6" )   Wt 83 kg   SpO2 97%   BMI 29.53 kg/m  Physical Exam Vitals and nursing note reviewed.  Constitutional:      General: She is not in acute distress.    Appearance: She is well-developed.  HENT:     Head: Normocephalic and atraumatic.  Eyes:     Extraocular Movements: Extraocular movements intact.     Conjunctiva/sclera: Conjunctivae normal.     Pupils: Pupils are equal, round, and reactive to light.  Cardiovascular:     Rate and Rhythm: Normal rate and regular rhythm.     Heart sounds: No murmur heard. Pulmonary:      Effort: Pulmonary effort is normal. No respiratory distress.     Breath sounds: Normal breath sounds. No wheezing or rales.  Abdominal:     General: There is no distension.     Palpations: Abdomen is soft.     Tenderness: There is no abdominal tenderness. There is no guarding.  Musculoskeletal:        General: No swelling.     Cervical back: Normal range of motion and neck supple.  Skin:    General: Skin is warm and dry.     Capillary Refill: Capillary refill takes less than 2 seconds.  Neurological:     Mental Status: She is alert.     Cranial Nerves: Cranial nerve deficit present.     Sensory: No sensory deficit.     Motor: No weakness.  Psychiatric:        Mood and Affect: Mood normal.     ED Results / Procedures / Treatments   Labs (all labs ordered are listed, but only abnormal results are displayed) Labs Reviewed  CBC WITH DIFFERENTIAL/PLATELET - Abnormal; Notable for the following components:      Result Value   WBC 38.8 (*)    Hemoglobin 11.0 (*)    HCT 34.6 (*)    RDW 16.8 (*)    Platelets 461 (*)    Neutro Abs 34.6 (*)    Monocytes Absolute 1.3 (*)    Abs Immature Granulocytes 1.01 (*)    All other components within normal limits  COMPREHENSIVE METABOLIC PANEL - Abnormal; Notable for the following components:   Sodium 134 (*)    Potassium 3.3 (*)    CO2 17 (*)    Glucose, Bld 109 (*)    Calcium 8.7 (*)    Albumin 2.7 (*)    Alkaline Phosphatase 155 (*)    Anion gap 19 (*)    All other components within normal limits  I-STAT CHEM 8, ED - Abnormal; Notable for the following components:   Sodium 134 (*)    Glucose, Bld 104 (*)    Calcium, Ion 1.13 (*)    TCO2 17 (*)    Hemoglobin 11.9 (*)    HCT 35.0 (*)    All other components within normal limits  LIPASE, BLOOD  ETHANOL  PROTIME-INR  APTT  TSH  RAPID URINE DRUG SCREEN, HOSP PERFORMED  URINALYSIS, ROUTINE W REFLEX MICROSCOPIC  CBG MONITORING, ED    EKG EKG Interpretation Date/Time:  Monday  February 08 2023 16:19:34 EDT Ventricular Rate:  121 PR Interval:  144 QRS Duration:  85 QT Interval:  335 QTC Calculation: 476 R Axis:   35  Text Interpretation: Sinus tachycardia Low voltage, precordial leads Probable anteroseptal infarct, old Confirmed by Vanetta Mulders [phone]) on 02/08/2023  4:37:32 PM  Radiology DG Chest Port 1 View  Result Date: 02/08/2023 CLINICAL DATA:  Shortness of breath EXAM: PORTABLE CHEST 1 VIEW COMPARISON:  None Available. FINDINGS: Low lung volumes. Borderline cardiomegaly. Dense right upper lobe opacity. No pneumothorax or pleural effusion IMPRESSION: Low lung volumes. Dense right upper lobe opacity, possible pneumonia though neoplasm could also produce this appearance. Consider correlation with chest CT Electronically Signed   By: Jasmine Pang M.D.   On: 02/08/2023 19:14   DG Shoulder Right  Result Date: 02/08/2023 CLINICAL DATA:  Right shoulder pain for 2 weeks EXAM: RIGHT SHOULDER - 2+ VIEW COMPARISON:  None Available. FINDINGS: Mild AC joint and glenohumeral degenerative change. No fracture or malalignment. Partially visualized right upper lobe opacity IMPRESSION: 1. Mild degenerative change. 2. Partially visualized right upper lobe opacity, recommend chest radiograph. Electronically Signed   By: Jasmine Pang M.D.   On: 02/08/2023 19:13   CT HEAD CODE STROKE WO CONTRAST  Result Date: 02/08/2023 CLINICAL DATA:  Code stroke. Neuro deficit, acute, stroke suspected. EXAM: CT HEAD WITHOUT CONTRAST TECHNIQUE: Contiguous axial images were obtained from the base of the skull through the vertex without intravenous contrast. RADIATION DOSE REDUCTION: This exam was performed according to the departmental dose-optimization program which includes automated exposure control, adjustment of the mA and/or kV according to patient size and/or use of iterative reconstruction technique. COMPARISON:  None. FINDINGS: Brain: Cerebral volume is normal. There is no acute  intracranial hemorrhage. No demarcated cortical infarct. No extra-axial fluid collection. No evidence of an intracranial mass. No midline shift. Vascular: No hyperdense vessel.  Atherosclerotic calcifications. Skull: No calvarial fracture or aggressive osseous lesion. Sinuses/Orbits: No mass or acute finding within the imaged orbits. No significant paranasal sinus disease at the imaged levels. ASPECTS [facility] Stroke Program Early CT Score) - Ganglionic level infarction (caudate, lentiform nuclei, internal capsule, insula, M1-M3 cortex): 7 - Supraganglionic infarction (M4-M6 cortex): 3 Total score (0-10 with 10 being normal): 10 No evidence of an acute intracranial abnormality. These results were called by telephone at the time of interpretation on 02/08/2023 at 5:07 pm to provider Vanetta Mulders , who verbally acknowledged these results. IMPRESSION: No evidence of an acute intracranial abnormality. Electronically Signed   By: Jackey Loge D.O.   On: 02/08/2023 17:08    Procedures Procedures    Medications Ordered in ED Medications  0.9 %  sodium chloride infusion (has no administration in time range)  cefTRIAXone (ROCEPHIN) 1 g in sodium chloride 0.9 % 100 mL IVPB (has no administration in time range)  azithromycin (ZITHROMAX) 500 mg in sodium chloride 0.9 % 250 mL IVPB (has no administration in time range)    ED Course/ Medical Decision Making/ A&P                                 Medical Decision Making Amount and/or Complexity of Data Reviewed Labs: ordered. Radiology: ordered.  Risk Prescription drug management. Decision regarding hospitalization.   CRITICAL CARE Performed by: Vanetta Mulders Total critical care time: 40 minutes Critical care time was exclusive of separately billable procedures and treating other patients. Critical care was necessary to treat or prevent imminent or life-threatening deterioration. Critical care was time spent personally by me on the following  activities: development of treatment plan with patient and/or surrogate as well as nursing, discussions with consultants, evaluation of patient's response to treatment, examination of patient, obtaining history from patient or surrogate, ordering  and performing treatments and interventions, ordering and review of laboratory studies, ordering and review of radiographic studies, pulse oximetry and re-evaluation of patient's condition.  Patient with acute onset of speech difficulties at 1615.  Best we can tell.  Patient was originally brought in by EMS for concerns for some nausea vomiting right shoulder pain some weakness there with generalized weakness.  Also complaint of shortness of breath.  Code stroke activated.  In addition board the other complaints had ordered chest x-ray.  Patient without a fever here.  Vital signs no hypotension.  Patient evaluated by the stroke team.  Head CT without acute findings.  Patient not a candidate for TNK.  Symptoms could have been ongoing for a while.  They did recommend MRI and MRA without contrast.  That has been ordered.  Chest x-ray which she had a complaint of some right shoulder pain does show a right upper lobe pneumonia versus mass.  Patient has a marked leukocytosis.  Differential still somewhat pending.  Potassium down a little bit at 3.3 liver function test normal other than alk phos of 155.  CO2 down a little bit at 17.  Alcohol level less than 10 lipase normal at 31 INR good at 1.2 TSH patient is on thyroid medicine was normal at 1.104.  Patient's right shoulder did not have any bony acute abnormalities.  But sees the right upper lobe opacity.  Discussed with hospitalist.  Based on the chest x-ray radiology is recommending CT chest with contrast.  Final Clinical Impression(s) / ED Diagnoses Final diagnoses:  Slurred speech  Pneumonia of right upper lobe due to infectious organism    Rx / DC Orders ED Discharge Orders     None          Vanetta Mulders, MD 02/08/23 1655    Vanetta Mulders, MD 02/08/23 Kristopher Oppenheim    Vanetta Mulders, MD 02/08/23 1928

## 2023-02-08 NOTE — Progress Notes (Signed)
[facility] Telemedicine Consult   Requesting Provider: Dr. Deretha Emory  Chief Complaint: code stroke  HPI:  72 yo F with hx of HTN, DM, throat cancer presented to ED for code stroke. She stated she has been "sick" for 2-3 weeks, intermittent fever, chills, and whole body weakness more both legs weakness. She also complaining of right shoulder pain for 3 weeks. Checked COVID at home was negative. She did not go to see her PCP. She reported not feeling well last night, had nausea and vomited once. This morning, she woke up reported OK. She went out with her son for some errands at 2pm. Around 3:20pm son called his brother reporting pt slurry speech. She was sent to ED for evaluation. In ED, pt speech off intermittently, no other significant neuro deficit. Code stroke activated. CT no acute finding. On exam, pt has intermittent stuttering pattern speech, distractable.   LKW: 3:20pm but pt has been weak all over for 3 weeks with n/v last night tpa given?: No, concerning for functional component, likely not stroke, mild symptoms nondisabling IR Thrombectomy? No, no sign of LVO Modified Rankin Scale: 2-Slight disability-UNABLE to perform all activities but does not need assistance   Exam: Vitals:   02/08/23 1700 02/08/23 1715  BP: (!) 133/94 (!) 127/94  Pulse: (!) 121 (!) 122  Resp: (!) 28 (!) 23  Temp:    SpO2: 97% 97%     Temp:  [98.8 F (37.1 C)] 98.8 F (37.1 C) (09/30 1617) Pulse Rate:  [119-123] 122 (09/30 1715) Resp:  [22-28] 23 (09/30 1715) BP: (127-138)/(81-94) 127/94 (09/30 1715) SpO2:  [94 %-97 %] 97 % (09/30 1715) Weight:  [83 kg] 83 kg (09/30 1618)  General - Well nourished, well developed, in no apparent distress.  Ophthalmologic - fundi not visualized due to noncooperation.  Cardiovascular - Regular rhythm and rate.  Neuro - awake, alert, eyes open, orientated to age, place, time and people. No aphasia, fluent language, following all simple commands.  Able to name and repeat and read. Intermittent stuttering speech but distractable. No gaze palsy, tracking bilaterally, visual field full. No facial droop. Tongue midline. Bilateral UEs 5/5, no drift but pt does have right shoulder pain. Bilaterally LEs 5/5, no drift. Sensation symmetrical bilaterally, b/l FTN intact, gait not tested.     NIH Stroke Scale  Level Of Consciousness 0=Alert; keenly responsive 1=Arouse to minor stimulation 2=Requires repeated stimulation to arouse or movements to pain 3=postures or unresponsive 0  LOC Questions to Month and Age 19=Answers both questions correctly 1=Answers one question correctly or dysarthria/intubated/trauma/language barrier 2=Answers neither question correctly or aphasia 0  LOC Commands      -Open/Close eyes     -Open/close grip     -Pantomime commands if communication barrier 0=Performs both tasks correctly 1=Performs one task correctly 2=Performs neighter task correctly 0  Best Gaze     -Only assess horizontal gaze 0=Normal 1=Partial gaze palsy 2=Forced deviation, or total gaze paresis 0  Visual 0=No visual loss 1=Partial hemianopia 2=Complete hemianopia 3=Bilateral hemianopia (blind including cortical blindness) 0  Facial Palsy     -Use grimace if obtunded 0=Normal symmetrical movement 1=Minor paralysis (asymmetry) 2=Partial paralysis (lower face) 3=Complete paralysis (upper and lower face) 0  Motor  0=No drift for 10/5 seconds 1=Drift, but does not hit bed 2=Some antigravity effort, hits  bed 3=No effort against gravity, limb falls 4=No movement 0=Amputation/joint fusion Right Arm 0     Leg 0    Left Arm 0  Leg 0  Limb Ataxia     - FNT/HTS 0=Absent or does not understand or paralyzed or amputation/joint fusion 1=Present in one limb 2=Present in two limbs 0  Sensory 0=Normal 1=Mild to moderate sensory loss 2=Severe to total sensory loss or coma/unresponsive 0  Best Language 0=No aphasia, normal 1=Mild to moderate  aphasia 2=Severe aphasia 3=Mute, global aphasia, or coma/unresponsive 0  Dysarthria 0=Normal 1=Mild to moderate 2=Severe, unintelligible or mute/anarthric 0=intubated/unable to test 1  Extinction/Neglect 0=No abnormality 1=visual/tactile/auditory/spatia/personal inattention/Extinction to bilateral simultaneous stimulation 2=Profound neglect/extinction more than 1 modality  0  Total   1      Imaging Reviewed:  CT HEAD CODE STROKE WO CONTRAST  Result Date: 02/08/2023 CLINICAL DATA:  Code stroke. Neuro deficit, acute, stroke suspected. EXAM: CT HEAD WITHOUT CONTRAST TECHNIQUE: Contiguous axial images were obtained from the base of the skull through the vertex without intravenous contrast. RADIATION DOSE REDUCTION: This exam was performed according to the departmental dose-optimization program which includes automated exposure control, adjustment of the mA and/or kV according to patient size and/or use of iterative reconstruction technique. COMPARISON:  None. FINDINGS: Brain: Cerebral volume is normal. There is no acute intracranial hemorrhage. No demarcated cortical infarct. No extra-axial fluid collection. No evidence of an intracranial mass. No midline shift. Vascular: No hyperdense vessel.  Atherosclerotic calcifications. Skull: No calvarial fracture or aggressive osseous lesion. Sinuses/Orbits: No mass or acute finding within the imaged orbits. No significant paranasal sinus disease at the imaged levels. ASPECTS [facility] Stroke Program Early CT Score) - Ganglionic level infarction (caudate, lentiform nuclei, internal capsule, insula, M1-M3 cortex): 7 - Supraganglionic infarction (M4-M6 cortex): 3 Total score (0-10 with 10 being normal): 10 No evidence of an acute intracranial abnormality. These results were called by telephone at the time of interpretation on 02/08/2023 at 5:07 pm to provider Vanetta Mulders , who verbally acknowledged these results. IMPRESSION: No evidence of an acute  intracranial abnormality. Electronically Signed   By: Jackey Loge D.O.   On: 02/08/2023 17:08     Labs reviewed in epic and pertinent values follow: WBC 38.8  Assessment:  72 yo F with hx of HTN, DM, remote throat cancer presented to ED for being "sick" for 2-3 weeks, intermittent fever, chills, and whole body weakness and right shoulder pain. Not feeling well last night, had nausea and vomited once. Reported speech off at 3:20pm. In ED,  pt has intermittent stuttering pattern speech, distractable, without other significant neuro deficit. CT no acute finding.  Pt speech symptoms with stuttering speech more likely functional. Not TNK candidate given functional component, mild symptoms nondisabling. Not IR candidate due to no LVO sign. Pt had 3 weeks hx of cold symptoms, malaise, and right shoulder pain, with significant leukocytosis. Recommend further medical work up. Will also recommend routine MRI and MRA brain to rule out stroke.   Recommendations:  Continue further medicine work up for leukocytosis, cold like symptoms and right shoulder pain Frequent neuro checks Telemetry monitoring MRI brain. If MRI positive for stroke, will need further stroke work up MRA head Right shoulder X-ray UA, ammonia level, and HgbA1C May consider blood culture PT/OT/speech consult Once pass swallow screen, consider resume home ASA and crestor Discussed with Dr. Deretha Emory ED physician We will follow   Consult Participants: RN, stroke response RN, son, pt and me Location of the provider: [facility] Location of the patient: [facility]  Time Code Stroke Page received:  4:46pm Time neurologist arrived:  4:47pm Time NIHSS completed: 5:01pm    This consult  was provided via telemedicine with 2-way video and audio communication. The patient/family was informed that care would be provided in this way and agreed to receive care in this manner.   This patient is receiving care for possible acute neurological changes. There  was 60 minutes of care by this provider at the time of service, including time for direct evaluation via telemedicine, review of medical records, imaging studies and discussion of findings with providers, the patient and/or family.  Marvel Plan, MD PhD Stroke Neurology 02/08/2023 5:43 PM

## 2023-02-08 NOTE — ED Notes (Signed)
Patient transported to CT 

## 2023-02-08 NOTE — ED Notes (Signed)
MRI called to notify of MRI order

## 2023-02-08 NOTE — ED Triage Notes (Signed)
Pt BIB EMS for complaints of weakness, nausea and right shoulder pain x 2 weeks. Pt states vomited last night. Pt complains of shoulder pain with no known cause worse with coughing. Pt also complains of SOB.

## 2023-02-08 NOTE — Progress Notes (Signed)
[place] 1634  Pt to CT 1635 Dr. Roda Shutters paged [phone] - called at 1646 - on screen at 1647 Pt back from CT 1649  mRS 2 LNW 1520

## 2023-02-09 ENCOUNTER — Encounter (HOSPITAL_COMMUNITY): Payer: Self-pay | Admitting: Family Medicine

## 2023-02-09 DIAGNOSIS — A419 Sepsis, unspecified organism: Secondary | ICD-10-CM | POA: Insufficient documentation

## 2023-02-09 DIAGNOSIS — E785 Hyperlipidemia, unspecified: Secondary | ICD-10-CM | POA: Insufficient documentation

## 2023-02-09 DIAGNOSIS — J189 Pneumonia, unspecified organism: Secondary | ICD-10-CM | POA: Diagnosis not present

## 2023-02-09 DIAGNOSIS — E43 Unspecified severe protein-calorie malnutrition: Secondary | ICD-10-CM | POA: Insufficient documentation

## 2023-02-09 DIAGNOSIS — R4781 Slurred speech: Principal | ICD-10-CM | POA: Insufficient documentation

## 2023-02-09 DIAGNOSIS — I10 Essential (primary) hypertension: Secondary | ICD-10-CM | POA: Diagnosis not present

## 2023-02-09 DIAGNOSIS — C3490 Malignant neoplasm of unspecified part of unspecified bronchus or lung: Secondary | ICD-10-CM | POA: Insufficient documentation

## 2023-02-09 DIAGNOSIS — E890 Postprocedural hypothyroidism: Secondary | ICD-10-CM

## 2023-02-09 DIAGNOSIS — E876 Hypokalemia: Secondary | ICD-10-CM | POA: Insufficient documentation

## 2023-02-09 DIAGNOSIS — R6510 Systemic inflammatory response syndrome (SIRS) of non-infectious origin without acute organ dysfunction: Secondary | ICD-10-CM | POA: Insufficient documentation

## 2023-02-09 DIAGNOSIS — C3411 Malignant neoplasm of upper lobe, right bronchus or lung: Secondary | ICD-10-CM

## 2023-02-09 LAB — CBC WITH DIFFERENTIAL/PLATELET
Abs Immature Granulocytes: 0.53 10*3/uL — ABNORMAL HIGH (ref 0.00–0.07)
Basophils Absolute: 0.1 10*3/uL (ref 0.0–0.1)
Basophils Relative: 0 %
Eosinophils Absolute: 0 10*3/uL (ref 0.0–0.5)
Eosinophils Relative: 0 %
HCT: 33 % — ABNORMAL LOW (ref 36.0–46.0)
Hemoglobin: 10 g/dL — ABNORMAL LOW (ref 12.0–15.0)
Immature Granulocytes: 1 %
Lymphocytes Relative: 6 %
Lymphs Abs: 2.3 10*3/uL (ref 0.7–4.0)
MCH: 25.3 pg — ABNORMAL LOW (ref 26.0–34.0)
MCHC: 30.3 g/dL (ref 30.0–36.0)
MCV: 83.5 fL (ref 80.0–100.0)
Monocytes Absolute: 1.3 10*3/uL — ABNORMAL HIGH (ref 0.1–1.0)
Monocytes Relative: 3 %
Neutro Abs: 37 10*3/uL — ABNORMAL HIGH (ref 1.7–7.7)
Neutrophils Relative %: 90 %
Platelets: 462 10*3/uL — ABNORMAL HIGH (ref 150–400)
RBC: 3.95 MIL/uL (ref 3.87–5.11)
RDW: 17.1 % — ABNORMAL HIGH (ref 11.5–15.5)
WBC: 41.2 10*3/uL — ABNORMAL HIGH (ref 4.0–10.5)
nRBC: 0 % (ref 0.0–0.2)

## 2023-02-09 LAB — MAGNESIUM: Magnesium: 2.2 mg/dL (ref 1.7–2.4)

## 2023-02-09 LAB — URINALYSIS, ROUTINE W REFLEX MICROSCOPIC
Bacteria, UA: NONE SEEN
Bilirubin Urine: NEGATIVE
Glucose, UA: >=500 mg/dL — AB
Ketones, ur: 80 mg/dL — AB
Nitrite: NEGATIVE
Protein, ur: 30 mg/dL — AB
Specific Gravity, Urine: 1.037 — ABNORMAL HIGH (ref 1.005–1.030)
WBC, UA: >50 WBC/hpf (ref 0–5)
pH: 5 (ref 5.0–8.0)

## 2023-02-09 LAB — COMPREHENSIVE METABOLIC PANEL
ALT: 15 U/L (ref 0–44)
AST: 13 U/L — ABNORMAL LOW (ref 15–41)
Albumin: 2.6 g/dL — ABNORMAL LOW (ref 3.5–5.0)
Alkaline Phosphatase: 151 U/L — ABNORMAL HIGH (ref 38–126)
Anion gap: 19 — ABNORMAL HIGH (ref 5–15)
BUN: 15 mg/dL (ref 8–23)
CO2: 15 mmol/L — ABNORMAL LOW (ref 22–32)
Calcium: 8.4 mg/dL — ABNORMAL LOW (ref 8.9–10.3)
Chloride: 99 mmol/L (ref 98–111)
Creatinine, Ser: 0.75 mg/dL (ref 0.44–1.00)
GFR, Estimated: >60 mL/min (ref >60)
Glucose, Bld: 69 mg/dL — ABNORMAL LOW (ref 70–99)
Potassium: 3.9 mmol/L (ref 3.5–5.1)
Sodium: 133 mmol/L — ABNORMAL LOW (ref 135–145)
Total Bilirubin: 0.8 mg/dL (ref 0.3–1.2)
Total Protein: 6.9 g/dL (ref 6.5–8.1)

## 2023-02-09 LAB — RAPID URINE DRUG SCREEN, HOSP PERFORMED
Amphetamines: NOT DETECTED
Barbiturates: NOT DETECTED
Benzodiazepines: NOT DETECTED
Cocaine: NOT DETECTED
Opiates: NOT DETECTED
Tetrahydrocannabinol: NOT DETECTED

## 2023-02-09 LAB — TSH: TSH: 0.63 u[IU]/mL (ref 0.350–4.500)

## 2023-02-09 LAB — AMMONIA: Ammonia: 15 umol/L (ref 9–35)

## 2023-02-09 LAB — STREP PNEUMONIAE URINARY ANTIGEN: Strep Pneumo Urinary Antigen: NEGATIVE

## 2023-02-09 LAB — PROCALCITONIN: Procalcitonin: 1.05 ng/mL

## 2023-02-09 MED ORDER — ENSURE ENLIVE PO LIQD
237.0000 mL | Freq: Three times a day (TID) | ORAL | Status: DC
  Administered 2023-02-09 – 2023-02-12 (×8): 237 mL via ORAL

## 2023-02-09 MED ORDER — ADULT MULTIVITAMIN W/MINERALS CH
1.0000 | ORAL_TABLET | Freq: Every day | ORAL | Status: DC
  Administered 2023-02-09 – 2023-02-12 (×4): 1 via ORAL
  Filled 2023-02-09 (×5): qty 1

## 2023-02-09 NOTE — Assessment & Plan Note (Deleted)
-  HR 117 - 123 -WBC 38.8 -RR 28 -Procal 1.5 -Likely post-obstructive pneumonia -Continue zithromax and rocephin -Sputum culture, strep and Legionella urine antigens - Continue to monitor

## 2023-02-09 NOTE — Assessment & Plan Note (Signed)
-   Likely postobstructive pneumonia in the setting of malignancy - Procalcitonin 1.05 - Leukocytosis, tachycardia, tachypnea - Continue Zithromax and Rocephin - Sputum culture and strep and Legionella urine antigens - Continue to monitor

## 2023-02-09 NOTE — Progress Notes (Signed)
Pt reports her son lives with her. She is currently working full time. No needs reported at this time. TOC will follow.    02/09/23 0744  TOC Brief Assessment  Insurance and Status Reviewed  Patient has primary care physician Yes  Home environment has been reviewed Son lives with her.  Prior level of function: Independent.  Prior/Current Home Services No current home services  Social Determinants of Health Reivew SDOH reviewed no interventions necessary  Readmission risk has been reviewed Yes  Transition of care needs no transition of care needs at this time

## 2023-02-09 NOTE — Plan of Care (Signed)
  Problem: Activity: Goal: Ability to tolerate increased activity will improve Outcome: Progressing   Problem: Coping: Goal: Level of anxiety will decrease Outcome: Progressing

## 2023-02-09 NOTE — Assessment & Plan Note (Signed)
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

## 2023-02-09 NOTE — Progress Notes (Signed)
Mobility Specialist Progress Note:    02/09/23 1145  Mobility  Activity Ambulated with assistance in room  Level of Assistance Contact guard assist, steadying assist  Assistive Device None  Distance Ambulated (ft) 16 ft  Range of Motion/Exercises Active;All extremities  Activity Response Tolerated well  Mobility Referral Yes  $Mobility charge 1 Mobility  Mobility Specialist Start Time (ACUTE ONLY) 1145  Mobility Specialist Stop Time (ACUTE ONLY) 1200  Mobility Specialist Time Calculation (min) (ACUTE ONLY) 15 min   Pt received in bed, family in room. Agreeable to mobility, required CGA to stand and ambulate with no AD. Tolerated well, deferred further ambulation d/t SOB and nausea. Returned pt to bed, alarm on. All needs met.   Lawerance Bach Mobility Specialist Please contact via Special educational needs teacher or  Rehab office at [phone]

## 2023-02-09 NOTE — Progress Notes (Signed)
Initial Nutrition Assessment  DOCUMENTATION CODES:   Severe malnutrition in context of acute illness/injury  INTERVENTION:   Ensure Plus High Protein po TID, each supplement provides 350 kcal and 20 grams of protein. MVI with minerals daily.  NUTRITION DIAGNOSIS:   Severe Malnutrition related to acute illness (new lung cancer) as evidenced by energy intake < or equal to 50% for > or equal to 5 days, moderate muscle depletion, percent weight loss.  GOAL:   Patient will meet greater than or equal to 90% of their needs  MONITOR:   PO intake, Supplement acceptance  REASON FOR ASSESSMENT:   Malnutrition Screening Tool    ASSESSMENT:   72 yo female admitted with generalized weakness, sepsis, PNA, new lung cancer. PMH includes glaucoma, HTN, arthritis, HLD, hypothyroidism.  Patient reports poor oral intake for the past 2 weeks d/t nausea and decreased appetite. She has been eating small amounts at home, less than half of what she usually eats. She endorses a lot of weight loss. She agreed to try Ensure supplements. Today, she ate 5 spoonfuls of oatmeal for breakfast and ate one potato chip and the egg salad off of her sandwich for lunch. She says this is much better than she has been eating.   Weight history reviewed. Patient with 11% weight loss since last weight documented on 12/14/22. Patient states this weight loss occurred over the past 2-3 weeks. She has mild-moderate depletion of muscle and subcutaneous fat mass. Meets criteria for severe malnutrition in the context of acute illness with moderate depletion of muscle mass, intake meeting </= 50% of estimated energy requirement for >/= 5 days, and 11% weight loss within one month.  Labs reviewed. Na 133, Hgb 10  Medications reviewed and include KCl x 1, IV antibiotics. IVF: NS at 100 ml/h  NUTRITION - FOCUSED PHYSICAL EXAM:  Flowsheet Row Most Recent Value  Orbital Region Mild depletion  Upper Arm Region Mild depletion   Thoracic and Lumbar Region Mild depletion  Buccal Region Mild depletion  Temple Region Mild depletion  Clavicle Bone Region Moderate depletion  Clavicle and Acromion Bone Region Mild depletion  Scapular Bone Region Mild depletion  Dorsal Hand Moderate depletion  Patellar Region Mild depletion  Anterior Thigh Region Mild depletion  Posterior Calf Region Moderate depletion  Edema (RD Assessment) None  Hair Reviewed  Eyes Reviewed  Mouth Reviewed  Skin Reviewed  Nails Reviewed       Diet Order:   Diet Order             Diet Heart Room service appropriate? Yes; Fluid consistency: Thin  Diet effective now                   EDUCATION NEEDS:   Not appropriate for education at this time  Skin:  Skin Assessment: Reviewed RN Assessment  Last BM:  unknown  Height:   Ht Readings from Last 1 Encounters:  02/08/23 5\' 6"  (1.676 m)    Weight:   Wt Readings from Last 1 Encounters:  02/08/23 74.2 kg    Ideal Body Weight:  59.1 kg  BMI:  Body mass index is 26.4 kg/m.  Estimated Nutritional Needs:   Kcal:  1800-2000  Protein:  85-100 gm  Fluid:  1.8-2 L   Gabriel Rainwater RD, LDN, CNSC Please refer to Amion for contact information.

## 2023-02-09 NOTE — Assessment & Plan Note (Signed)
Continue Synthroid °

## 2023-02-09 NOTE — Inpatient Diabetes Management (Signed)
Inpatient Diabetes Program Recommendations  AACE/ADA: New Consensus Statement on Inpatient Glycemic Control   Target Ranges:  Prepandial:   less than 140 mg/dL      Peak postprandial:   less than 180 mg/dL (1-2 hours)      Critically ill patients:  140 - 180 mg/dL    Latest Reference Range & Units 02/08/23 16:32 02/08/23 16:58 02/09/23 05:05  Glucose 70 - 99 mg/dL 161 (H) 096 (H) 69 (L)   Review of Glycemic Control  Diabetes history: DM2 Outpatient Diabetes medications: Janumet [phone] mg daily, Jardiance 25 mg daily Current orders for Inpatient glycemic control: None  Inpatient Diabetes Program Recommendations:    Insulin: While inpatient, may want to consider ordering CBGs AC&HS and Novolog 0-6 units TID with meals.  Thanks, Orlando Penner, RN, MSN, CDCES Diabetes Coordinator Inpatient Diabetes Program [phone] (Team Pager from 8am to 5pm)

## 2023-02-09 NOTE — H&P (Addendum)
History and Physical    Patient: Christine Poole:[id number] DOB: 07/20/1950 DOA: 02/08/2023 DOS: the patient was seen and examined on 02/09/2023 PCP: Renaye Rakers, MD  Patient coming from: Home  Chief Complaint:  Chief Complaint  Patient presents with   Emesis   Weakness   HPI: Christine Poole is a 72 y.o. female with medical history significant of arthritis, glaucoma, hypothyroidism, hypertension, hyperlipidemia, and more presents to the ED with a chief complaint of generalized weakness.  Patient reports she started feeling ill about 1 month ago.  The symptoms have waxed and waned.  She reports she is lost about 23 pounds in that 1 month.  Her main complaint is generalized weakness.  She used to be able to ambulate independently.  Recently she has been counter/furniture surfing because she cannot walk without holding onto something.  She reports increased shortness of breath.  The shortness of breath has been constant unlike the other symptoms which have eased off and then come back several times.  She reports she has a cough.  Cough hurts her right shoulder.  She describes it as a sharp pain.  It is worse with the cough, palpation, and some changes in position.  Patient reports early satiety.  Lead she reports nausea and vomiting.  She reports her last p.o. intake was Sunday night.  She barely had anything to eat, but could not force anything more down.  She started throwing up after that meal.  She denies any hematemesis.  Patient is a former smoker, but she quit years ago.  She does not drink alcohol.  She is full code.  At arrival she had slurred speech that she attributes to her generalized weakness, but code stroke was called.  No stroke was shown on CT or MRI.  Her speech is normal now.  She reports she is feeling much better since starting treatment in the ED. Review of Systems: As mentioned in the history of present illness. All other systems reviewed and are negative. Past  Medical History:  Diagnosis Date   Arthritis    Glaucoma    Hyperplastic polyp of large intestine    Hypertension    Past Surgical History:  Procedure Laterality Date   ABDOMINAL HYSTERECTOMY     COLONOSCOPY N/A 08/08/2012   IPJ:ASNKNL polyps and internal hemorrhoids.Status post removal rectal polyps   COLONOSCOPY N/A 04/16/2014   Procedure: COLONOSCOPY;  Surgeon: Corbin Ade, MD;  Location: [facility];  Service: Endoscopy;  Laterality: N/A;  1:30pm   KNEE ARTHROSCOPY Left    Social History:  reports that she has quit smoking. Her smoking use included cigarettes. She has never used smokeless tobacco. She reports that she does not drink alcohol and does not use drugs.  No Known Allergies  Family History  Problem Relation Age of Onset   Diabetes Mother    Hypertension Mother    Hyperlipidemia Mother    Heart attack Mother     Prior to Admission medications   Medication Sig Start Date End Date Taking? Authorizing Provider  diltiazem (CARDIZEM) 90 MG tablet Take 90 mg by mouth 2 (two) times daily.   Yes [provider]  ezetimibe (ZETIA) 10 MG tablet Take 10 mg by mouth daily. 05/31/17  Yes [provider]  JANUMET XR [id number] MG TB24 Take 1 tablet by mouth daily. 12/14/22  Yes Reardon, Freddi Starr, NP  JARDIANCE 25 MG TABS tablet Take 1 tablet (25 mg total) by mouth daily. 12/14/22  Yes  Dani Gobble, NP  levothyroxine (SYNTHROID) 25 MCG tablet Take 1 tablet (25 mcg total) by mouth daily before breakfast. 12/14/22  Yes Reardon, Freddi Starr, NP  lisinopril-hydrochlorothiazide (PRINZIDE,ZESTORETIC) 20-25 MG per tablet Take 1 tablet by mouth daily.   Yes [provider]  rosuvastatin (CRESTOR) 40 MG tablet Take 40 mg by mouth daily. 02/29/20  Yes [provider]  aspirin 81 MG tablet Take 81 mg by mouth daily.    [provider]  etodolac (LODINE) 400 MG tablet Take 400 mg by mouth 2 (two) times daily.    [provider]   latanoprost (XALATAN) 0.005 % ophthalmic solution 1 drop at bedtime.    [provider]  Multiple Vitamin (MULTIVITAMIN) tablet Take 1 tablet by mouth daily.    [provider]  PREVIDENT 5000 ENAMEL PROTECT 1.1-5 % GEL Take 1 Application by mouth daily. Use on teeth once a day. 11/11/22   [provider]    Physical Exam: Vitals:   02/08/23 1930 02/08/23 2056 02/08/23 2320 02/09/23 0302  BP: (!) 140/95  122/73 135/80  Pulse: (!) 118  (!) 116 (!) 114  Resp: 20  (!) 22 (!) 24  Temp:   98.7 F (37.1 C) 98.9 F (37.2 C)  TempSrc:      SpO2: 96%  99% 99%  Weight:  74.2 kg    Height:  5\' 6"  (1.676 m)     1.  General: Patient lying supine in bed,  no acute distress   2. Psychiatric: Alert and oriented x 3, mood and behavior normal for situation, pleasant and cooperative with exam   3. Neurologic: Speech and language are normal, face is symmetric, moves all 4 extremities voluntarily, at baseline without acute deficits on limited exam   4. HEENMT:  Head is atraumatic, normocephalic, pupils reactive to light, neck is supple, trachea is midline, mucous membranes are moist   5. Respiratory : Diminished on the right with some faint rhonchi, left is clear, no wheezing, no no cyanosis, so accessory muscle use, but maintaining oxygen sats   6. Cardiovascular : Heart rate tachycardic, rhythm is regular, no murmurs, rubs or gallops, no peripheral edema, peripheral pulses palpated   7. Gastrointestinal:  Abdomen is soft, nondistended, nontender to palpation bowel sounds active, no masses or organomegaly palpated   8. Skin:  Skin is warm, dry and intact without rashes, acute lesions, or ulcers on limited exam   9.Musculoskeletal:  No acute deformities or trauma, no asymmetry in tone, no peripheral edema, peripheral pulses palpated, no tenderness to palpation in the extremities  Data Reviewed: In the ED Temp 98.8, heart rate 117-123, respiratory rate 20-28,  blood pressure 127/81-141/95, satting 94-97% Leukocytosis at 38.8, hemoglobin 11.0, thrombocytosis at 4 and 61 Hypokalemia at 3.3 Chest x-ray shows dense right upper lobe pneumonia COVID stroke was called and CT head was negative, MRI/MRA negative for acute stroke CTA chest shows no pulmonary embolism but shows significant malignancy Zithromax Rocephin started in the ED EKG shows a heart rate of 117, sinus tach, QTc 468 Admission requested for sepsis secondary to pneumonia with tachycardia, tachypnea, leukocytosis, slurred speech Assessment and Plan: * CAP (community acquired pneumonia) - Likely postobstructive pneumonia in the setting of malignancy - Procalcitonin 1.05 - Leukocytosis, tachycardia, tachypnea - Continue Zithromax and Rocephin - Sputum culture and strep and Legionella urine antigens - Continue to monitor  Sepsis due to pneumonia (HCC) -HR 117 - 123 -WBC 38.8 -RR 28 - With slurred speech -Procal 1.5 -  Likely post-obstructive pneumonia -Continue zithromax and rocephin -Sputum culture, strep and Legionella urine antigens - Blood culture pending, unfortunately after antibiotics - Continue to monitor  Slurred speech - Patient reports that this was not an isolated deficit, but rather part of her generalized weakness - Code stroke was done that showed no acute intracranial abnormality - MRA/MRI head was done that showed no acute intracranial abnormality but 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 - Neurology recommending neurochecks, monitor telemetry, ammonia level, resume home aspirin and Crestor - Continue to monitor   Lung cancer (HCC) - Newly diagnosed lung cancer  with CT chest showing large right upper lobe malignancy extending into the right hilum and infiltrative mass involving the inferior right scapula with pathologic fracture - Consult oncology - Continue to monitor  HLD (hyperlipidemia) - Continue statin - Continue Zetia  Essential  hypertension - Continue lisinopril and Cardizem  Hypokalemia - Potassium 3.3 - Replace and recheck  Hypothyroidism following radioiodine therapy - Continue Synthroid      Advance Care Planning:   Code Status: Full Code  Consults: Oncology  Family Communication: No family at bedside  Severity of Illness: The appropriate patient status for this patient is INPATIENT. Inpatient status is judged to be reasonable and necessary in order to provide the required intensity of service to ensure the patient's safety. The patient's presenting symptoms, physical exam findings, and initial radiographic and laboratory data in the context of their chronic comorbidities is felt to place them at high risk for further clinical deterioration. Furthermore, it is not anticipated that the patient will be medically stable for discharge from the hospital within 2 midnights of admission.   * I certify that at the point of admission it is my clinical judgment that the patient will require inpatient hospital care spanning beyond 2 midnights from the point of admission due to high intensity of service, high risk for further deterioration and high frequency of surveillance required.*  Author: Lilyan Gilford, DO 02/09/2023 3:09 AM  For on call review [email or web address].

## 2023-02-09 NOTE — Progress Notes (Signed)
Patient seen and examined; admitted after midnight secondary to generalized weakness, shortness of breath and strokelike symptoms.  Initial workup has rule out the presence of acute stroke.  Neurology recommended admission to complete MRI/MRA.  Further workup also ended demonstrating the presence of postobstructive PNA and findings of what appear to be lung cancer.  Please refer to H&P written by Dr. Carren Rang on 02/09/23 for further info/details on admission.  Plan: -Continue IV antibiotics and supportive care -Follow culture results -Oncology has been consulted and will follow any further recommendations -Follow clinical response.  Vassie Loll MD [phone]

## 2023-02-09 NOTE — Assessment & Plan Note (Addendum)
-  HR 117 - 123 -WBC 38.8 -RR 28 - With slurred speech -Procal 1.5 -Likely post-obstructive pneumonia -Continue zithromax and rocephin -Sputum culture, strep and Legionella urine antigens - Blood culture pending, unfortunately after antibiotics - Continue to monitor

## 2023-02-09 NOTE — Assessment & Plan Note (Signed)
Continue lisinopril and Cardizem

## 2023-02-09 NOTE — Assessment & Plan Note (Signed)
-   Newly diagnosed lung cancer  with CT chest showing large right upper lobe malignancy extending into the right hilum and infiltrative mass involving the inferior right scapula with pathologic fracture - Consult oncology - Continue to monitor

## 2023-02-09 NOTE — Assessment & Plan Note (Addendum)
-   Continue statin - Continue Zetia

## 2023-02-09 NOTE — Assessment & Plan Note (Signed)
-   Potassium 3.3 - Replace and recheck

## 2023-02-10 ENCOUNTER — Inpatient Hospital Stay (HOSPITAL_COMMUNITY): Payer: BC Managed Care – PPO

## 2023-02-10 DIAGNOSIS — J181 Lobar pneumonia, unspecified organism: Secondary | ICD-10-CM

## 2023-02-10 DIAGNOSIS — J189 Pneumonia, unspecified organism: Secondary | ICD-10-CM | POA: Diagnosis not present

## 2023-02-10 DIAGNOSIS — R4781 Slurred speech: Secondary | ICD-10-CM | POA: Diagnosis not present

## 2023-02-10 DIAGNOSIS — E890 Postprocedural hypothyroidism: Secondary | ICD-10-CM | POA: Diagnosis not present

## 2023-02-10 MED ORDER — PIPERACILLIN-TAZOBACTAM 3.375 G IVPB
3.3750 g | Freq: Three times a day (TID) | INTRAVENOUS | Status: AC
  Administered 2023-02-10 – 2023-02-18 (×26): 3.375 g via INTRAVENOUS
  Filled 2023-02-10 (×27): qty 50

## 2023-02-10 MED ORDER — MEGESTROL ACETATE 400 MG/10ML PO SUSP
400.0000 mg | Freq: Every day | ORAL | Status: DC
  Administered 2023-02-10 – 2023-02-12 (×3): 400 mg via ORAL
  Filled 2023-02-10 (×3): qty 10

## 2023-02-10 MED ORDER — ACETAMINOPHEN 325 MG PO TABS
650.0000 mg | ORAL_TABLET | Freq: Four times a day (QID) | ORAL | Status: DC
  Administered 2023-02-10 – 2023-02-13 (×9): 650 mg via ORAL
  Filled 2023-02-10 (×10): qty 2

## 2023-02-10 MED ORDER — IOHEXOL 300 MG/ML  SOLN
75.0000 mL | Freq: Once | INTRAMUSCULAR | Status: AC | PRN
  Administered 2023-02-10: 75 mL via INTRAVENOUS

## 2023-02-10 NOTE — Progress Notes (Signed)
   02/10/23 0316  Assess: MEWS Score  Temp 98.6 F (37 C)  BP 130/81  MAP (mmHg) 92  Pulse Rate (!) 113  Resp 18  SpO2 98 %  O2 Device Room Air  Assess: MEWS Score  MEWS Temp 0  MEWS Systolic 0  MEWS Pulse 2  MEWS RR 0  MEWS LOC 0  MEWS Score 2  MEWS Score Color Yellow  Assess: if the MEWS score is Yellow or Red  Were vital signs accurate and taken at a resting state? Yes  Does the patient meet 2 or more of the SIRS criteria? No  MEWS guidelines implemented  No, previously yellow, continue vital signs every 4 hours  Assess: SIRS CRITERIA  SIRS Temperature  0  SIRS Pulse 1  SIRS Respirations  0  SIRS WBC 0  SIRS Score Sum  1

## 2023-02-10 NOTE — Progress Notes (Signed)
Pt was able to rest more tonight. NIHSS remains 1 mainly because of R arm weakness. Speech remained clear. She does have quite a bit of R arm/ shoulder discomfort which is affecting ROM. She ambulated to restroom with one assist.

## 2023-02-10 NOTE — Plan of Care (Signed)
  Problem: Activity: °Goal: Risk for activity intolerance will decrease °Outcome: Progressing °  °Problem: Nutrition: °Goal: Adequate nutrition will be maintained °Outcome: Progressing °  °Problem: Coping: °Goal: Level of anxiety will decrease °Outcome: Progressing °  °

## 2023-02-10 NOTE — Evaluation (Signed)
Physical Therapy Brief Evaluation and Discharge Note Patient Details Name: Christine Poole MRN: [id number] DOB: 09-Jul-1950 Today's Date: 02/10/2023   History of Present Illness  72 y.o. female with medical history significant of arthritis, glaucoma, hypothyroidism, hypertension, hyperlipidemia, and more presents to the ED with a chief complaint of generalized weakness.  Patient reports she started feeling ill about 1 month ago.  The symptoms have waxed and waned.  She reports she is lost about 23 pounds in that 1 month.  Her main complaint is generalized weakness.  She used to be able to ambulate independently.  Recently she has been counter/furniture surfing because she cannot walk without holding onto something.  She reports increased shortness of breath.  The shortness of breath has been constant unlike the other symptoms which have eased off and then come back several times.  She reports she has a cough.  Cough hurts her right shoulder.  She describes it as a sharp pain. Patient complains of early satiety and decreased po intake.  She denies any dysphagia or odynophagia.  She is a former smoker, but quit many years ago.  At arrival she had slurred speech that she attributes to her generalized weakness, but code stroke was called.  Clinical Impression   Patient is performing at functional baseline, no safety concerns noted throughout evaluation. Demonstrates modified independence with transfers and ambulation but no concerns for navigation at home. Pt insisting she is close to baseline besides a little weakness.  Currently, patient is safe to DC home with appropriate services, DME, and any other needs indicated.  PT to sign off at this time.  Please reconsult acute PT services if functional changes occur while admitted.  Thank you.  Safe to mobilize with nursing and mobility staff as able.      PT Assessment    Assistance Needed at Discharge       Equipment Recommendations None recommended  by PT  Recommendations for Other Services       Precautions/Restrictions Precautions Precautions: None Restrictions Weight Bearing Restrictions: No        Mobility  Bed Mobility          Transfers Overall transfer level: Needs assistance Equipment used: None Transfers: Sit to/from Stand, Bed to chair/wheelchair/BSC Sit to Stand: Modified independent (Device/Increase time)           General transfer comment: Modified independent sit/stand from EOB with no cues, steady and safe. Modified independent from sit/stand from toilet with use of grab-bar. Safe steady.    Ambulation/Gait Ambulation/Gait assistance: Modified independent (Device/Increase time) Gait Distance (Feet): 25 Feet Assistive device: IV Pole Gait Pattern/deviations: WFL(Within Functional Limits)   General Gait Details: slow, steady ambulation from EOB to toilet and back. Safe movement patterns with using IV pole for guidance.  Home Activity Instructions    Stairs            Modified Rankin (Stroke Patients Only)        Balance   Sitting-balance support: No upper extremity supported Sitting balance-Leahy Scale: Good Sitting balance - Comments: good/good sitting balance   Standing balance support: No upper extremity supported Standing balance-Leahy Scale: Good Standing balance comment: good/good standing balance at EOB, toilet age appropriate balance reactions.          Pertinent Vitals/Pain   Pain Assessment Pain Assessment: Faces Faces Pain Scale: Hurts even more Pain Location: R shoulder Pain Descriptors / Indicators: Aching Pain Intervention(s): Monitored during session     Home Living  Living Arrangements: Alone       Home Equipment: None        Prior Function        UE/LE Assessment               Communication   Communication Communication: No apparent difficulties     Cognition         General Comments      Exercises      Assessment/Plan    PT Problem List         PT Visit Diagnosis Muscle weakness (generalized) (M62.81);Other abnormalities of gait and mobility (R26.89)    No Skilled PT Patient is modified independent with all activity/mobility   Co-evaluation                AMPAC 6 Clicks Help needed turning from your back to your side while in a flat bed without using bedrails?: None Help needed moving from lying on your back to sitting on the side of a flat bed without using bedrails?: None Help needed moving to and from a bed to a chair (including a wheelchair)?: None Help needed standing up from a chair using your arms (e.g., wheelchair or bedside chair)?: None Help needed to walk in hospital room?: None Help needed climbing 3-5 steps with a railing? : A Little 6 Click Score: 23      End of Session Equipment Utilized During Treatment: Other (comment) (attempting to utilize gait belt, pt insisted no need for gait belt. Pt educated on purpose for gait belt and continued to decline.) Activity Tolerance: Patient tolerated treatment well;No increased pain Patient left: in bed;with call bell/phone within reach;with family/visitor present Nurse Communication: Mobility status PT Visit Diagnosis: Muscle weakness (generalized) (M62.81);Other abnormalities of gait and mobility (R26.89)     Time: 1610-9604 PT Time Calculation (min) (ACUTE ONLY): 15 min  Charges:   PT Evaluation $PT Eval Low Complexity: 1 Low      Nelida Meuse PT, DPT Physical Therapist with [facility] 336 [phone] office \  Nelida Meuse  02/10/2023, 3:32 PM

## 2023-02-10 NOTE — Progress Notes (Signed)
PROGRESS NOTE  Christine Poole ZOX:[id number] DOB: 07/05/1950 DOA: 02/08/2023 PCP: Renaye Rakers, MD  Brief History:  72 y.o. female with medical history significant of arthritis, glaucoma, hypothyroidism, hypertension, hyperlipidemia, and more presents to the ED with a chief complaint of generalized weakness.  Patient reports she started feeling ill about 1 month ago.  The symptoms have waxed and waned.  She reports she is lost about 23 pounds in that 1 month.  Her main complaint is generalized weakness.  She used to be able to ambulate independently.  Recently she has been counter/furniture surfing because she cannot walk without holding onto something.  She reports increased shortness of breath.  The shortness of breath has been constant unlike the other symptoms which have eased off and then come back several times.  She reports she has a cough.  Cough hurts her right shoulder.  She describes it as a sharp pain. Patient complains of early satiety and decreased po intake.  She denies any dysphagia or odynophagia.  She is a former smoker, but quit many years ago. At arrival she had slurred speech that she attributes to her generalized weakness, but code stroke was called.  In ED, pt speech off intermittently, no other significant neuro deficit. Code stroke activated. CT no acute finding. On exam, pt has intermittent stuttering pattern speech, distractable. MR brain was negative.  Neurology, Dr. Jerel Shepherd, saw the patient and felt Pt speech symptoms with stuttering speech more likely functional.    Assessment/Plan: Sepsis -present on admission -presented with fever and tachycardia -PCT 1.05 -due to post-obstructive pneumonia -broaden abx coverage to zosyn -start IVF -lactic acid 1.5 -10/1 blood cultures neg  Obstructive pneumonia -02/08/23 CTA chest--5x7 cm RUL mass extending into R-hilum with complete obstruction of RUL bronchus and compression of SVC;   mass effect into R-pulm  artery suggesting tumor thrombus;  infiltrative mass of R-inferior scapula -start zosyn  Lung massses -CTA chest neg for PE;  results as discussed above -case discussed with med/onc, Dr. Dollene Primrose IR biopsy of scapula unless IR determines there is an easier targe  Hyperlipidemia -continue statin  Essential HTN -hold lisinopril/hydrochlorothiazide for now due to soft BP  Hypokalemia -replete  Hypothyroidism following RAI therapy -continue synthroid -TSH 0.630  DM2 with hypoglycemia -allow for liberal glycemic control at this point      Family Communication:  no Family at bedside  Consultants:  med/onc, IR  Code Status:  FULL   DVT Prophylaxis:  [place] Heparin    Procedures: As Listed in Progress Note Above  Antibiotics: None       Subjective: Pt complains of right shoulder pain.  Denies f/c, cp, sob,n/v/d, abd pain  Objective: Vitals:   02/09/23 2106 02/10/23 0316 02/10/23 0858 02/10/23 1307  BP: 136/74 130/81 116/84 128/76  Pulse: (!) 115 (!) 113 (!) 110 (!) 109  Resp: 20 18    Temp: 98.3 F (36.8 C) 98.6 F (37 C)    TempSrc: Oral     SpO2: 98% 98% 98% 96%  Weight:      Height:        Intake/Output Summary (Last 24 hours) at 02/10/2023 1345 Last data filed at 02/10/2023 1312 Gross per 24 hour  Intake 3378.26 ml  Output --  Net 3378.26 ml   Weight change:  Exam:  General:  Pt is alert, follows commands appropriately, not in acute distress HEENT: No icterus, No thrush, No neck mass, [place]/AT Cardiovascular:  RRR, S1/S2, no rubs, no gallops Respiratory: eight basilar crackles.  No wheeze Abdomen: Soft/+BS, non tender, non distended, no guarding Extremities: No edema, No lymphangitis, No petechiae, No rashes, no synovitis   Data Reviewed: I have personally reviewed following labs and imaging studies Basic Metabolic Panel: Recent Labs  Lab 02/08/23 1632 02/08/23 1658 02/09/23 0505  NA 134* 134* 133*  K 3.3* 3.6 3.9  CL 98 99 99   CO2 17*  --  15*  GLUCOSE 109* 104* 69*  BUN 17 15 15   CREATININE 0.90 0.60 0.75  CALCIUM 8.7*  --  8.4*  MG  --   --  2.2   Liver Function Tests: Recent Labs  Lab 02/08/23 1632 02/09/23 0505  AST 16 13*  ALT 19 15  ALKPHOS 155* 151*  BILITOT 1.0 0.8  PROT 7.3 6.9  ALBUMIN 2.7* 2.6*   Recent Labs  Lab 02/08/23 1632  LIPASE 31   Recent Labs  Lab 02/09/23 0505  AMMONIA 15   Coagulation Profile: Recent Labs  Lab 02/08/23 1632  INR 1.2   CBC: Recent Labs  Lab 02/08/23 1632 02/08/23 1658 02/09/23 0505  WBC 38.8*  --  41.2*  NEUTROABS 34.6*  --  37.0*  HGB 11.0* 11.9* 10.0*  HCT 34.6* 35.0* 33.0*  MCV 82.6  --  83.5  PLT 461*  --  462*   Cardiac Enzymes: No results for input(s): "CKTOTAL", "CKMB", "CKMBINDEX", "TROPONINI" in the last 168 hours. BNP: Invalid input(s): "POCBNP" CBG: Recent Labs  Lab 02/08/23 1652  GLUCAP 99   HbA1C: No results for input(s): "HGBA1C" in the last 72 hours. Urine analysis:    Component Value Date/Time   COLORURINE YELLOW 02/09/2023 0500   APPEARANCEUR CLOUDY (A) 02/09/2023 0500   LABSPEC 1.037 (H) 02/09/2023 0500   PHURINE 5.0 02/09/2023 0500   GLUCOSEU >=500 (A) 02/09/2023 0500   HGBUR MODERATE (A) 02/09/2023 0500   BILIRUBINUR NEGATIVE 02/09/2023 0500   KETONESUR 80 (A) 02/09/2023 0500   PROTEINUR 30 (A) 02/09/2023 0500   NITRITE NEGATIVE 02/09/2023 0500   LEUKOCYTESUR MODERATE (A) 02/09/2023 0500   Sepsis Labs: @LABRCNTIP (procalcitonin:4,lacticidven:4) ) Recent Results (from the past 240 hour(s))  Culture, blood (Routine X 2) w Reflex to ID Panel     Status: None (Preliminary result)   Collection Time: 02/09/23  5:05 AM   Specimen: BLOOD LEFT HAND  Result Value Ref Range Status   Specimen Description BLOOD LEFT HAND  Final   Special Requests   Final    BOTTLES DRAWN AEROBIC AND ANAEROBIC Blood Culture adequate volume   Culture   Final    NO GROWTH 1 DAY Performed at [facility], [address]  [place], [place] [address]    Report Status PENDING  Incomplete  Culture, blood (Routine X 2) w Reflex to ID Panel     Status: None (Preliminary result)   Collection Time: 02/09/23  5:07 AM   Specimen: BLOOD  Result Value Ref Range Status   Specimen Description BLOOD BLOOD LEFT HAND  Final   Special Requests   Final    BOTTLES DRAWN AEROBIC AND ANAEROBIC Blood Culture adequate volume   Culture   Final    NO GROWTH 1 DAY Performed at [facility], [address] [place], [place] [address]    Report Status PENDING  Incomplete     Scheduled Meds:  acetaminophen  650 mg Oral Q6H   aspirin EC  81 mg Oral Daily   diltiazem  90 mg Oral  BID   ezetimibe  10 mg Oral Daily   feeding supplement  237 mL Oral TID BM   heparin  5,000 Units Subcutaneous Q8H   lisinopril  20 mg Oral Daily   And   hydrochlorothiazide  25 mg Oral Daily   levothyroxine  25 mcg Oral Q0600   multivitamin with minerals  1 tablet Oral Daily   rosuvastatin  40 mg Oral Daily   Continuous Infusions:  sodium chloride 100 mL/hr at 02/10/23 0543   sodium chloride     azithromycin Stopped (02/09/23 2252)   cefTRIAXone (ROCEPHIN)  IV Stopped (02/09/23 2107)    Procedures/Studies: CT Angio Chest PE W/Cm &/Or Wo Cm  Result Date: 02/08/2023 CLINICAL DATA:  Concern for pulmonary edema. Right upper lobe pneumonia/mass. EXAM: CT ANGIOGRAPHY CHEST WITH CONTRAST TECHNIQUE: Multidetector CT imaging of the chest was performed using the standard protocol during bolus administration of intravenous contrast. Multiplanar CT image reconstructions and MIPs were obtained to evaluate the vascular anatomy. RADIATION DOSE REDUCTION: This exam was performed according to the departmental dose-optimization program which includes automated exposure control, adjustment of the mA and/or kV according to patient size and/or use of iterative reconstruction technique. CONTRAST:  75mL OMNIPAQUE IOHEXOL 350 MG/ML SOLN COMPARISON:  Chest radiograph dated  02/08/2023. FINDINGS: Evaluation of this exam is limited due to respiratory motion artifact. Cardiovascular: Top-normal cardiac size. Small pericardial effusion. Mild atherosclerotic calcification of the thoracic aorta. No aneurysmal dilatation or dissection. The origins of the great vessels of the aortic arch appear patent. Evaluation of the pulmonary arteries is limited due to respiratory motion. No pulmonary artery embolus identified. Mediastinum/Nodes: Right hilar mass/adenopathy measures approximately 5 x 7 cm. There is a large area of consolidation in the right upper lobe representing a combination of mass and post obstructive atelectasis or pneumonia. There is extension of the mass into the right apex with compression of the SVC. There is mass effect and compression of the right pulmonary artery with near complete occlusion of the right upper lobe pulmonary artery and high-grade narrowing of the right middle and right lower lobe pulmonary artery branches. Apparent protrusion of the lobulated mass into the right pulmonary artery suggestive of vascular invasion and tumor thrombus. Additional mediastinal adenopathy in the anterior mediastinum and prevascular space. The esophagus is grossly unremarkable. Asymmetric enlargement and heterogeneity of the right thyroid lobe. This has been evaluated on previous imaging. (ref: J Am Coll Radiol. 2015 Feb;12(2): 143-50).No mediastinal fluid collection. Lungs/Pleura: Consolidative changes of the majority of the right upper lobe as above, combination of malignancy and postobstructive atelectasis/infiltrate. There is mass effect and complete compression of the right upper lobe bronchus. Additional lobulated subpleural mass in the right upper lobe measures approximately 2 x 4 cm. The left lung is clear. There is no pleural effusion or pneumothorax. There is mild compression of the right mainstem bronchus. The central airways remain patent. Upper Abdomen: Fatty liver.  Indeterminate 3.5 x 5.0 cm right adrenal mass as well as partially visualized 2.7 x 2.4 cm hypodense mass in the uncinate process of the pancreas. Further evaluation of the abdominal masses with MRI without and with contrast on a nonemergent/outpatient basis recommended. Musculoskeletal: There is infiltrative mass involving the inferior right scapula with associated pathologic fracture. Osteopenia with degenerative changes of the spine. Review of the MIP images confirms the above findings. IMPRESSION: 1. No CT evidence of pulmonary embolism. 2. 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 Associated complete obstruction of the right upper lobe  bronchus and compression of the SVC and invasion into the right pulmonary artery. 3. Mediastinal adenopathy. 4. Infiltrative mass involving the inferior right scapula with associated pathologic fracture. 5. Indeterminate right adrenal mass and partially visualized hypodense mass in the uncinate process of the pancreas. Further evaluation with MRI without and with contrast on a nonemergent/outpatient basis recommended. 6.  Aortic Atherosclerosis (ICD10-I70.0). Electronically Signed   By: Elgie Collard M.D.   On: 02/08/2023 22:23   MR Brain Wo Contrast (neuro protocol)  Result Date: 02/08/2023 CLINICAL DATA:  Slurred speech, nausea and vomiting EXAM: MRI HEAD WITHOUT CONTRAST MRA HEAD WITHOUT CONTRAST TECHNIQUE: Multiplanar, multi-echo pulse sequences of the brain and surrounding structures were acquired without intravenous contrast. Angiographic images of the Circle of Willis were acquired using MRA technique without intravenous contrast. COMPARISON:  None Available. FINDINGS: MRI HEAD FINDINGS Brain: No acute infarct, mass effect or extra-axial collection. No chronic microhemorrhage or siderosis. Normal white matter signal, parenchymal volume and CSF spaces. The midline structures are normal. 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 Vascular:  Normal flow voids. Skull and upper cervical spine: Normal marrow signal. Sinuses/Orbits: No acute or significant finding. Other: None. MRA HEAD FINDINGS POSTERIOR CIRCULATION: --Vertebral arteries: Normal --Inferior cerebellar arteries: Normal. --Basilar artery: Normal. --Superior cerebellar arteries: Normal. --Posterior cerebral arteries: Normal. ANTERIOR CIRCULATION: --Intracranial internal carotid arteries: Normal. --Anterior cerebral arteries (ACA): Normal. --Middle cerebral arteries (MCA): Normal. Anatomic variants: None IMPRESSION: 1. No acute intracranial abnormality. 2. 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 3. Normal intracranial MRA. Electronically Signed   By: Deatra Robinson M.D.   On: 02/08/2023 21:45   MR Angio Head WO CONTRAST  Result Date: 02/08/2023 CLINICAL DATA:  Slurred speech, nausea and vomiting EXAM: MRI HEAD WITHOUT CONTRAST MRA HEAD WITHOUT CONTRAST TECHNIQUE: Multiplanar, multi-echo pulse sequences of the brain and surrounding structures were acquired without intravenous contrast. Angiographic images of the Circle of Willis were acquired using MRA technique without intravenous contrast. COMPARISON:  None Available. FINDINGS: MRI HEAD FINDINGS Brain: No acute infarct, mass effect or extra-axial collection. No chronic microhemorrhage or siderosis. Normal white matter signal, parenchymal volume and CSF spaces. The midline structures are normal. 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 Vascular: Normal flow voids. Skull and upper cervical spine: Normal marrow signal. Sinuses/Orbits: No acute or significant finding. Other: None. MRA HEAD FINDINGS POSTERIOR CIRCULATION: --Vertebral arteries: Normal --Inferior cerebellar arteries: Normal. --Basilar artery: Normal. --Superior cerebellar arteries: Normal. --Posterior cerebral arteries: Normal. ANTERIOR CIRCULATION: --Intracranial internal carotid arteries: Normal. --Anterior cerebral arteries (ACA): Normal. --Middle cerebral arteries (MCA):  Normal. Anatomic variants: None IMPRESSION: 1. No acute intracranial abnormality. 2. 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 3. Normal intracranial MRA. Electronically Signed   By: Deatra Robinson M.D.   On: 02/08/2023 21:45   DG Chest Port 1 View  Result Date: 02/08/2023 CLINICAL DATA:  Shortness of breath EXAM: PORTABLE CHEST 1 VIEW COMPARISON:  None Available. FINDINGS: Low lung volumes. Borderline cardiomegaly. Dense right upper lobe opacity. No pneumothorax or pleural effusion IMPRESSION: Low lung volumes. Dense right upper lobe opacity, possible pneumonia though neoplasm could also produce this appearance. Consider correlation with chest CT Electronically Signed   By: Jasmine Pang M.D.   On: 02/08/2023 19:14   DG Shoulder Right  Result Date: 02/08/2023 CLINICAL DATA:  Right shoulder pain for 2 weeks EXAM: RIGHT SHOULDER - 2+ VIEW COMPARISON:  None Available. FINDINGS: Mild AC joint and glenohumeral degenerative change. No fracture or malalignment. Partially visualized right upper lobe opacity IMPRESSION: 1. Mild degenerative change.  2. Partially visualized right upper lobe opacity, recommend chest radiograph. Electronically Signed   By: Jasmine Pang M.D.   On: 02/08/2023 19:13   CT HEAD CODE STROKE WO CONTRAST  Result Date: 02/08/2023 CLINICAL DATA:  Code stroke. Neuro deficit, acute, stroke suspected. EXAM: CT HEAD WITHOUT CONTRAST TECHNIQUE: Contiguous axial images were obtained from the base of the skull through the vertex without intravenous contrast. RADIATION DOSE REDUCTION: This exam was performed according to the departmental dose-optimization program which includes automated exposure control, adjustment of the mA and/or kV according to patient size and/or use of iterative reconstruction technique. COMPARISON:  None. FINDINGS: Brain: Cerebral volume is normal. There is no acute intracranial hemorrhage. No demarcated cortical infarct. No extra-axial fluid collection. No evidence of an  intracranial mass. No midline shift. Vascular: No hyperdense vessel.  Atherosclerotic calcifications. Skull: No calvarial fracture or aggressive osseous lesion. Sinuses/Orbits: No mass or acute finding within the imaged orbits. No significant paranasal sinus disease at the imaged levels. ASPECTS [facility] Stroke Program Early CT Score) - Ganglionic level infarction (caudate, lentiform nuclei, internal capsule, insula, M1-M3 cortex): 7 - Supraganglionic infarction (M4-M6 cortex): 3 Total score (0-10 with 10 being normal): 10 No evidence of an acute intracranial abnormality. These results were called by telephone at the time of interpretation on 02/08/2023 at 5:07 pm to provider Vanetta Mulders , who verbally acknowledged these results. IMPRESSION: No evidence of an acute intracranial abnormality. Electronically Signed   By: Jackey Loge D.O.   On: 02/08/2023 17:08    Catarina Hartshorn, DO  [facility]  If 7PM-7AM, please contact night-coverage [email or web address] Password [email or web address] 02/10/2023, 1:45 PM   LOS: 2 days

## 2023-02-10 NOTE — Hospital Course (Addendum)
72 y.o. female with medical history significant of arthritis, glaucoma, hypothyroidism, hypertension, hyperlipidemia, and more presents to the ED with a chief complaint of generalized weakness.  Patient reports she started feeling ill about 1 month ago.  The symptoms have waxed and waned.  She reports she is lost about 23 pounds in that 1 month.  Her main complaint is generalized weakness.  She used to be able to ambulate independently.  Recently she has been counter/furniture surfing because she cannot walk without holding onto something.  She reports increased shortness of breath.  The shortness of breath has been constant unlike the other symptoms which have eased off and then come back several times.  She reports she has a cough.  Cough hurts her right shoulder.  She describes it as a sharp pain. Patient complains of early satiety and decreased po intake.  She denies any dysphagia or odynophagia.  She is a former smoker, but quit many years ago. At arrival she had slurred speech that she attributes to her generalized weakness, but code stroke was called.  In ED, pt speech off intermittently, no other significant neuro deficit. Code stroke activated. CT no acute finding. On exam, pt has intermittent stuttering pattern speech, distractable. MR brain was negative.  Neurology, Dr. Jerel Shepherd, saw the patient and felt Pt speech symptoms with stuttering speech more likely functional.   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  There is some compression of the SVC.  There is also an infiltrative mass involving the right inferior scapula. 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  The patient underwent percutaneous biopsy of her right scapula on  02/11/2023.    Her hospital stay was prolonged secondary to development of abdominal pain on 02/12/2023 for which CT of the abdomen and pelvis revealed free air.  The patient was taken to the operating room for exploratory laparotomy.  She was found to have small bowel perforation.  She underwent small bowel resection and primary anastomosis with Dr. Algis Greenhouse.  Pt developed increasing WBC and serial CT scans reveal rapidly progressive tumor involvement, bilateral PE, distal aorta occlusion and SMV clot; Drs. Lamona Curl, Bridges recommending hospice care.  Goals of care meeting 02/20/23 with Dr. Henreitta Leber, patient, son, sister and patient transitioning to full comfort focused care and will pursue residential hospice placement.

## 2023-02-10 NOTE — Progress Notes (Signed)
Mobility Specialist Progress Note:    02/10/23 1045  Mobility  Activity Ambulated with assistance in hallway  Level of Assistance Contact guard assist, steadying assist  Assistive Device None  Distance Ambulated (ft) 140 ft  Range of Motion/Exercises Active;All extremities  Activity Response Tolerated well  Mobility Referral Yes  $Mobility charge 1 Mobility  Mobility Specialist Start Time (ACUTE ONLY) 1045  Mobility Specialist Stop Time (ACUTE ONLY) 1100  Mobility Specialist Time Calculation (min) (ACUTE ONLY) 15 min   Pt received in bed, agreeable to mobility. Required CGA to stand and ambulate with no AD. Tolerated well, asx throughout. Returned pt to room, bed alarm on. All needs met.   Lawerance Bach Mobility Specialist Please contact via Special educational needs teacher or  Rehab office at [phone]

## 2023-02-11 ENCOUNTER — Inpatient Hospital Stay (HOSPITAL_COMMUNITY)
Admit: 2023-02-11 | Discharge: 2023-02-11 | Disposition: A | Discharge status: Home / Self Care | Payer: BC Managed Care – PPO | Attending: Internal Medicine | Admitting: Internal Medicine

## 2023-02-11 ENCOUNTER — Encounter (HOSPITAL_COMMUNITY): Payer: Self-pay | Admitting: Family Medicine

## 2023-02-11 ENCOUNTER — Other Ambulatory Visit: Payer: Self-pay

## 2023-02-11 DIAGNOSIS — R918 Other nonspecific abnormal finding of lung field: Secondary | ICD-10-CM

## 2023-02-11 DIAGNOSIS — J189 Pneumonia, unspecified organism: Secondary | ICD-10-CM | POA: Diagnosis not present

## 2023-02-11 DIAGNOSIS — J181 Lobar pneumonia, unspecified organism: Secondary | ICD-10-CM | POA: Diagnosis not present

## 2023-02-11 DIAGNOSIS — E890 Postprocedural hypothyroidism: Secondary | ICD-10-CM | POA: Diagnosis not present

## 2023-02-11 DIAGNOSIS — C3411 Malignant neoplasm of upper lobe, right bronchus or lung: Secondary | ICD-10-CM

## 2023-02-11 LAB — BASIC METABOLIC PANEL
Anion gap: 13 (ref 5–15)
BUN: 7 mg/dL — ABNORMAL LOW (ref 8–23)
CO2: 22 mmol/L (ref 22–32)
Calcium: 8.5 mg/dL — ABNORMAL LOW (ref 8.9–10.3)
Chloride: 95 mmol/L — ABNORMAL LOW (ref 98–111)
Creatinine, Ser: 0.59 mg/dL (ref 0.44–1.00)
GFR, Estimated: >60 mL/min (ref >60)
Glucose, Bld: 122 mg/dL — ABNORMAL HIGH (ref 70–99)
Potassium: 2.7 mmol/L — CL (ref 3.5–5.1)
Sodium: 130 mmol/L — ABNORMAL LOW (ref 135–145)

## 2023-02-11 LAB — LEGIONELLA PNEUMOPHILA SEROGP 1 UR AG: L. pneumophila Serogp 1 Ur Ag: NEGATIVE

## 2023-02-11 LAB — CBC
HCT: 31.6 % — ABNORMAL LOW (ref 36.0–46.0)
Hemoglobin: 9.9 g/dL — ABNORMAL LOW (ref 12.0–15.0)
MCH: 25.5 pg — ABNORMAL LOW (ref 26.0–34.0)
MCHC: 31.3 g/dL (ref 30.0–36.0)
MCV: 81.4 fL (ref 80.0–100.0)
Platelets: 426 10*3/uL — ABNORMAL HIGH (ref 150–400)
RBC: 3.88 MIL/uL (ref 3.87–5.11)
RDW: 16.6 % — ABNORMAL HIGH (ref 11.5–15.5)
WBC: 33.9 10*3/uL — ABNORMAL HIGH (ref 4.0–10.5)
nRBC: 0 % (ref 0.0–0.2)

## 2023-02-11 LAB — MAGNESIUM: Magnesium: 1.9 mg/dL (ref 1.7–2.4)

## 2023-02-11 MED ORDER — POTASSIUM CHLORIDE IN NACL 20-0.9 MEQ/L-% IV SOLN: INTRAVENOUS | Status: DC

## 2023-02-11 MED ORDER — FENTANYL CITRATE (PF) 100 MCG/2ML IJ SOLN
INTRAMUSCULAR | Status: AC | PRN
  Administered 2023-02-11 (×3): 50 ug via INTRAVENOUS

## 2023-02-11 MED ORDER — LIDOCAINE HCL 1 % IJ SOLN: 10.0000 mL | Freq: Once | INTRAMUSCULAR | Status: DC

## 2023-02-11 MED ORDER — FENTANYL CITRATE (PF) 100 MCG/2ML IJ SOLN
INTRAMUSCULAR | Status: AC
  Filled 2023-02-11: qty 4

## 2023-02-11 MED ORDER — IPRATROPIUM-ALBUTEROL 0.5-2.5 (3) MG/3ML IN SOLN
RESPIRATORY_TRACT | Status: AC
  Administered 2023-02-11: 3 mL via RESPIRATORY_TRACT
  Filled 2023-02-11: qty 3

## 2023-02-11 MED ORDER — PROCHLORPERAZINE EDISYLATE 10 MG/2ML IJ SOLN
10.0000 mg | Freq: Four times a day (QID) | INTRAMUSCULAR | Status: DC | PRN
  Administered 2023-02-12 – 2023-02-13 (×3): 10 mg via INTRAVENOUS
  Filled 2023-02-11 (×4): qty 2

## 2023-02-11 MED ORDER — POTASSIUM CHLORIDE 10 MEQ/100ML IV SOLN
10.0000 meq | INTRAVENOUS | Status: AC
  Administered 2023-02-11 (×4): 10 meq via INTRAVENOUS
  Filled 2023-02-11 (×3): qty 100

## 2023-02-11 MED ORDER — MIDAZOLAM HCL 2 MG/2ML IJ SOLN
INTRAMUSCULAR | Status: AC | PRN
Start: 2023-02-11 — End: 2023-02-11
  Administered 2023-02-11 (×2): 1 mg via INTRAVENOUS

## 2023-02-11 MED ORDER — NALOXONE HCL 0.4 MG/ML IJ SOLN
INTRAMUSCULAR | Status: AC
  Filled 2023-02-11: qty 1

## 2023-02-11 MED ORDER — IPRATROPIUM-ALBUTEROL 0.5-2.5 (3) MG/3ML IN SOLN
3.0000 mL | RESPIRATORY_TRACT | Status: DC | PRN
  Administered 2023-02-12: 3 mL via RESPIRATORY_TRACT
  Filled 2023-02-11: qty 3

## 2023-02-11 MED ORDER — FLUMAZENIL 0.5 MG/5ML IV SOLN
INTRAVENOUS | Status: AC
  Filled 2023-02-11: qty 5

## 2023-02-11 MED ORDER — POTASSIUM CHLORIDE CRYS ER 20 MEQ PO TBCR
40.0000 meq | EXTENDED_RELEASE_TABLET | Freq: Once | ORAL | Status: AC
  Administered 2023-02-11: 40 meq via ORAL

## 2023-02-11 MED ORDER — HEPARIN SODIUM (PORCINE) 5000 UNIT/ML IJ SOLN
5000.0000 [IU] | Freq: Three times a day (TID) | INTRAMUSCULAR | Status: DC
  Administered 2023-02-12 – 2023-02-19 (×24): 5000 [IU] via SUBCUTANEOUS
  Filled 2023-02-11 (×24): qty 1

## 2023-02-11 MED ORDER — POLYETHYLENE GLYCOL 3350 17 G PO PACK
17.0000 g | PACK | Freq: Every day | ORAL | Status: DC
  Administered 2023-02-11 – 2023-02-12 (×2): 17 g via ORAL
  Filled 2023-02-11 (×3): qty 1

## 2023-02-11 MED ORDER — POTASSIUM CHLORIDE 10 MEQ/100ML IV SOLN
10.0000 meq | INTRAVENOUS | Status: DC
  Filled 2023-02-11: qty 100

## 2023-02-11 MED ORDER — MIDAZOLAM HCL 2 MG/2ML IJ SOLN
INTRAMUSCULAR | Status: AC
  Filled 2023-02-11: qty 4

## 2023-02-11 NOTE — Progress Notes (Signed)
   02/11/23 1848  Assess: MEWS Score  Temp 98.8 F (37.1 C)  BP 134/86  MAP (mmHg) 100  Pulse Rate (!) 123  Resp 20  SpO2 98 %  O2 Device Room Air  Assess: MEWS Score  MEWS Temp 0  MEWS Systolic 0  MEWS Pulse 2  MEWS RR 0  MEWS LOC 0  MEWS Score 2  MEWS Score Color Yellow  Assess: if the MEWS score is Yellow or Red  Were vital signs accurate and taken at a resting state? Yes  Does the patient meet 2 or more of the SIRS criteria? No  MEWS guidelines implemented  Yes, yellow  Treat  MEWS Interventions Considered administering scheduled or prn medications/treatments as ordered  Take Vital Signs  Increase Vital Sign Frequency  Yellow: Q2hr x1, continue Q4hrs until patient remains green for 12hrs  Escalate  MEWS: Escalate Yellow: Discuss with charge nurse and consider notifying provider and/or RRT  Notify: Charge Nurse/RN  Name of Charge Nurse/RN Notified Latina Veterinary surgeon  Provider Notification  Provider Name/Title Dr. Claire Shown  Date Provider Notified 02/11/23  Time Provider Notified 1911  Method of Notification Page  Notification Reason Other (Comment) (yellow mews)  Provider response Other (Comment) (waiting for response)  Date of Provider Response  (waiting for response)  Time of Provider Response  (waiting for response)  Assess: SIRS CRITERIA  SIRS Temperature  0  SIRS Pulse 1  SIRS Respirations  0  SIRS WBC 0  SIRS Score Sum  1   Informed provider and CN of change in patients status. No new orders at this time, awaiting for response from provider. Informed oncoming shift.

## 2023-02-11 NOTE — Progress Notes (Signed)
I met with the patient and her family members at bedside in room 332. I provided my contact information and encouraged the patient to call with questions or concerns.

## 2023-02-11 NOTE — Consult Note (Signed)
[facility] Consultation Oncology  Name: Christine Poole      MRN: [id number]    Location: W098/J191-47  Date: 02/11/2023 Time:4:07 PM   REFERRING PHYSICIAN: Dr. Arbutus Leas  REASON FOR CONSULT: Right lung mass, scapular mass, abdominal lymphadenopathy   DIAGNOSIS: Highly probable metastatic lung cancer  HISTORY OF PRESENT ILLNESS: Christine Poole is a 72 year old very pleasant female seen in consultation today at the request of Dr. Arbutus Leas.  Patient has been experiencing right shoulder/upper extremity pain for the last 1 month.  She reported 20 pound weight loss.  She is currently working full-time, 12 hours a day at [employer].  A chest x-ray in the ER on 02/08/2023 showed right upper lobe opacity.  CT angiogram of the chest showed large right upper lobe malignancy extending into the right hilum with associated complete obstruction of the right upper lobe bronchus, compression of the SVC and invasion into the right pulmonary artery.  Mediastinal adenopathy is present.  Infiltrative mass involving the inferior right scapula with associated pathological fracture.  CT abdomen and pelvis showed extensive adenopathy in the porta hepatis, peripancreatic region retroperitoneum and central mesentery.  Bilateral perirenal space nodules suspicious for metastasis.  She had right scapular mass biopsy done today.  She lives at home with her son.  She is independent of ADLs and IADLs.  She works full-time job.  She is an ex-smoker, quit smoking more than 20 years ago.  Smoked half pack per day for 20 to 30 years.  No family history of malignancy.  PAST MEDICAL HISTORY:   Past Medical History:  Diagnosis Date   Arthritis    Glaucoma    Hyperplastic polyp of large intestine    Hypertension     ALLERGIES: No Known Allergies    MEDICATIONS: I have reviewed the patient's current medications.     PAST SURGICAL HISTORY Past Surgical History:  Procedure Laterality Date   ABDOMINAL HYSTERECTOMY      COLONOSCOPY N/A 08/08/2012   WGN:FAOZHY polyps and internal hemorrhoids.Status post removal rectal polyps   COLONOSCOPY N/A 04/16/2014   Procedure: COLONOSCOPY;  Surgeon: Corbin Ade, MD;  Location: [facility];  Service: Endoscopy;  Laterality: N/A;  1:30pm   KNEE ARTHROSCOPY Left     FAMILY HISTORY: Family History  Problem Relation Age of Onset   Diabetes Mother    Hypertension Mother    Hyperlipidemia Mother    Heart attack Mother     SOCIAL HISTORY:  reports that she has quit smoking. Her smoking use included cigarettes. She has never used smokeless tobacco. She reports that she does not drink alcohol and does not use drugs.  PERFORMANCE STATUS: The patient's performance status is 1 - Symptomatic but completely ambulatory  PHYSICAL EXAM: Most Recent Vital Signs: Blood pressure 114/70, pulse 79, temperature 98.5 F (36.9 C), temperature source Oral, resp. rate 14, height 5\' 6"  (1.676 m), weight 163 lb 9.3 oz (74.2 kg), SpO2 95%. BP 114/70 (BP Location: Right Arm)   Pulse 79   Temp 98.5 F (36.9 C) (Oral)   Resp 14   Ht 5\' 6"  (1.676 m)   Wt 163 lb 9.3 oz (74.2 kg)   SpO2 95%   BMI 26.40 kg/m  General appearance: alert, cooperative, and appears stated age Extremities:  no edema Neurologic: Grossly normal  LABORATORY DATA:  Results for orders placed or performed during the hospital encounter of 02/08/23 (from the past 48 hour(s))  Basic metabolic panel     Status: Abnormal  Collection Time: 02/11/23  4:38 AM  Result Value Ref Range   Sodium 130 (L) 135 - 145 mmol/L   Potassium 2.7 (LL) 3.5 - 5.1 mmol/L    Comment: DELTA CHECK NOTED CRITICAL RESULT CALLED TO, READ BACK BY AND VERIFIED WITH JOHNSON,B AT 5:25AM ON 02/11/23 BY FESTERMAN,C    Chloride 95 (L) 98 - 111 mmol/L   CO2 22 22 - 32 mmol/L   Glucose, Bld 122 (H) 70 - 99 mg/dL    Comment: Glucose reference range applies only to samples taken after fasting for at least 8 hours.   BUN 7 (L) 8 - 23 mg/dL    Creatinine, Ser 6.96 0.44 - 1.00 mg/dL   Calcium 8.5 (L) 8.9 - 10.3 mg/dL   GFR, Estimated >29 >52 mL/min    Comment: (NOTE) Calculated using the CKD-EPI Creatinine Equation (2021)    Anion gap 13 5 - 15    Comment: Performed at [facility], [address] [place], [place] [address]  Magnesium     Status: None   Collection Time: 02/11/23  4:38 AM  Result Value Ref Range   Magnesium 1.9 1.7 - 2.4 mg/dL    Comment: Performed at [facility], [address], [place], [place] [address]  CBC     Status: Abnormal   Collection Time: 02/11/23  4:38 AM  Result Value Ref Range   WBC 33.9 (H) 4.0 - 10.5 K/uL   RBC 3.88 3.87 - 5.11 MIL/uL   Hemoglobin 9.9 (L) 12.0 - 15.0 g/dL   HCT 27.2 (L) 53.6 - 64.4 %   MCV 81.4 80.0 - 100.0 fL   MCH 25.5 (L) 26.0 - 34.0 pg   MCHC 31.3 30.0 - 36.0 g/dL   RDW 03.4 (H) 74.2 - 59.5 %   Platelets 426 (H) 150 - 400 K/uL   nRBC 0.0 0.0 - 0.2 %    Comment: Performed at [facility], [address] [place], [place] [address]      RADIOGRAPHY: CT ABDOMEN PELVIS W CONTRAST  Result Date: 02/10/2023 CLINICAL DATA:  Prominent intrathoracic malignancy especially involving the right upper lobe and mediastinum, inpatient assessment for staging workup. * Tracking Code: BO * EXAM: CT ABDOMEN AND PELVIS WITH CONTRAST TECHNIQUE: Multidetector CT imaging of the abdomen and pelvis was performed using the standard protocol following bolus administration of intravenous contrast. RADIATION DOSE REDUCTION: This exam was performed according to the departmental dose-optimization program which includes automated exposure control, adjustment of the mA and/or kV according to patient size and/or use of iterative reconstruction technique. CONTRAST:  75mL OMNIPAQUE IOHEXOL 300 MG/ML  SOLN COMPARISON:  CT chest 02/08/2023 FINDINGS: Lower chest: Small right pleural effusion. Small pericardial effusion. Right coronary artery atheromatous vascular calcifications and left anterior descending  coronary atherosclerosis. Hepatobiliary: Unremarkable Pancreas: Obscuration of the pancreatic head due to adjacent extensive adenopathy. I do not see a definite mass arising from the pancreatic head although the region is indistinct and difficult to characterize. No dorsal pancreatic duct dilatation. Spleen: Unremarkable Adrenals/Urinary Tract: Mixed density right adrenal mass measuring 5.2 by 3.9 cm with some internal calcifications and also internal macroscopic fat. 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 Left adrenal gland unremarkable. Urinary bladder and ureters unremarkable. Soft tissue nodule in the right posterior  perirenal space 1.1 by 1.0 cm, possibly a metastatic lesion. Bilateral small nodules in the perirenal space suspicious for metastases occluding a 5 mm posterior left perirenal space nodule on image 35 series 3 and a 1.6 by 1.3 cm lower perirenal space nodule on image 62 series 6. Other small perirenal space nodules are present bilaterally. Small hypodense lesions of the right kidney are probably cysts although technically too small to characterize. No further imaging workup of these lesions is indicated. Suspected periurethral diverticulum on image 89 series 3. Stomach/Bowel: Unremarkable Vascular/Lymphatic: Atherosclerosis is present, including aortoiliac atherosclerotic disease. Porta hepatis/peripancreatic node 2.6 cm in short axis on image 33 series 3. Aortocaval node along the posterior margin of the pancreatic head measures 2.7 cm in short axis on image 38 series 3. Peripancreatic lymph node on image 34 series 3 measures 1.2 cm short axis. Central mesenteric node on image 58 series 3 measures 1.7 cm in short axis. Adjacent central mesenteric lymph  node on the same image measures 0.2 cm in short axis. Reproductive: Uterus absent.  Adnexa unremarkable. Other: Subcutaneous nodule along the left upper quadrant anteriorly, 0.5 cm in diameter on image 22 series 3. This is nonspecific. 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 IMPRESSION: 1. Extensive adenopathy in the porta hepatis/peripancreatic region, retroperitoneum, and central mesentery, compatible with metastatic disease. 2. Mixed density right adrenal mass with internal macroscopic fat and some internal calcifications. 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 3. 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 4. Small right pleural effusion. 5. Small pericardial effusion. 6. Coronary atherosclerosis. 7. 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 8. 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 9. Aortic and coronary atherosclerosis. Aortic Atherosclerosis (ICD10-I70.0). Electronically Signed   By: Gaylyn Rong M.D.   On: 02/10/2023 18:48       PATHOLOGY: Right scapular mass biopsy (02/11/2023): Results pending  ASSESSMENT and PLAN:  1.  Highly probable metastatic lung cancer to the right scapula, right adrenal, intra-abdominal lymph nodes: - I have reviewed images of the CT scan with the patient and her 2 sons and grandson. - Treatment options will be discussed after biopsy results reported. - Patient would like to have treatments done for her malignancy. - Will arrange for port placement as outpatient next week. - She will follow-up with me in the office next week to discuss final  pathology and treatment plan. - Discussed with Dr. Arbutus Leas.  All questions were answered. The patient knows to call the clinic with any problems, questions or concerns. We can certainly see the patient much sooner if necessary.    Doreatha Massed

## 2023-02-11 NOTE — Procedures (Signed)
Vascular and Interventional Radiology Procedure Note  Patient: Christine Poole DOB: Feb 17, 1951 Medical Record Number: [id number] Note Date/Time: 02/11/23 12:53 PM   Performing Physician: Roanna Banning, MD Assistant(s): None  Diagnosis: R scapula mass. No DX   Procedure: RIGHT SCAPULA MASS BIOPSY  Anesthesia: Conscious Sedation Complications: None Estimated Blood Loss: Minimal Specimens: Sent for Pathology  Findings:  Successful CT-guided biopsy of R scapula mass. A total of 2 samples were obtained. Hemostasis of the tract was achieved using Manual Pressure.  Plan: Bed rest for 1 hours.  See detailed procedure note with images in PACS. The patient tolerated the procedure well without incident or complication and was returned to Recovery in stable condition.    Roanna Banning, MD [facility] Radiology   Pager. [phone] Clinic. [phone]

## 2023-02-11 NOTE — Progress Notes (Signed)
   IR Note  Pt is scheduled for R Scapular mass bx in IR at [facility] today [facility] RN to arrange [facility] to have pt to [facility] Rad by 1000 am today  Pt will return to [facility] after procedure

## 2023-02-11 NOTE — Progress Notes (Signed)
Transport with [facility] to IR arranged.

## 2023-02-11 NOTE — Progress Notes (Addendum)
PROGRESS NOTE  Christine Poole ZOX:[id number] DOB: Apr 05, 1951 DOA: 02/08/2023 PCP: Renaye Rakers, MD  Brief History:  72 y.o. female with medical history significant of arthritis, glaucoma, hypothyroidism, hypertension, hyperlipidemia, and more presents to the ED with a chief complaint of generalized weakness.  Patient reports she started feeling ill about 1 month ago.  The symptoms have waxed and waned.  She reports she is lost about 23 pounds in that 1 month.  Her main complaint is generalized weakness.  She used to be able to ambulate independently.  Recently she has been counter/furniture surfing because she cannot walk without holding onto something.  She reports increased shortness of breath.  The shortness of breath has been constant unlike the other symptoms which have eased off and then come back several times.  She reports she has a cough.  Cough hurts her right shoulder.  She describes it as a sharp pain. Patient complains of early satiety and decreased po intake.  She denies any dysphagia or odynophagia.  She is a former smoker, but quit many years ago. At arrival she had slurred speech that she attributes to her generalized weakness, but code stroke was called.  In ED, pt speech off intermittently, no other significant neuro deficit. Code stroke activated. CT no acute finding. On exam, pt has intermittent stuttering pattern speech, distractable. MR brain was negative.  Neurology, Dr. Jerel Shepherd, saw the patient and felt Pt speech symptoms with stuttering speech more likely functional.    Assessment/Plan:  Sepsis -present on admission -presented with fever and tachycardia -PCT 1.05 -due to post-obstructive pneumonia -broaden abx coverage to zosyn -started IVF -lactic acid 1.5 -10/1 blood cultures neg to date   Obstructive pneumonia -02/08/23 CTA chest--5x7 cm RUL mass extending into R-hilum with complete obstruction of RUL bronchus and compression of SVC;   mass effect into  R-pulm artery suggesting tumor thrombus;  infiltrative mass of R-inferior scapula -start zosyn   Lung massses -CTA chest neg for PE;  results as discussed above -case discussed with med/onc, Dr. Dollene Primrose IR biopsy of scapula unless IR determines there is an easier target -10/3--IR biopsy of scapular area -10/3--discussed with Dr. 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  Right shoulder pain better controlled.  Denies cp, sob, n/v/d, abd pain  Objective: Vitals:   02/10/23 1307 02/10/23 2046 02/11/23 0504 02/11/23 1405  BP: 128/76 119/77 131/86 114/70  Pulse: (!) 109 (!) 118 (!) 109 79  Resp:  20 18 14   Temp:  98.5 F (36.9 C) 98.5 F (36.9 C)   TempSrc:   Oral   SpO2: 96% 94% 95% 95%  Weight:  Height:        Intake/Output Summary (Last 24 hours) at 02/11/2023 1806 Last data filed at 02/11/2023 0320 Gross per 24 hour  Intake 910 ml  Output --  Net 910 ml   Weight change:  Exam:  General:  Pt is alert, follows commands appropriately, not in acute distress HEENT: No icterus, No thrush, No neck mass, [place]/AT Cardiovascular: RRR, S1/S2, no rubs, no gallops Respiratory: diminished BS on right.  No wheeze Abdomen:  Soft/+BS, non tender, non distended, no guarding Extremities: No edema, No lymphangitis, No petechiae, No rashes, no synovitis   Data Reviewed: I have personally reviewed following labs and imaging studies Basic Metabolic Panel: Recent Labs  Lab 02/08/23 1632 02/08/23 1658 02/09/23 0505 02/11/23 0438  NA 134* 134* 133* 130*  K 3.3* 3.6 3.9 2.7*  CL 98 99 99 95*  CO2 17*  --  15* 22  GLUCOSE 109* 104* 69* 122*  BUN 17 15 15  7*  CREATININE 0.90 0.60 0.75 0.59  CALCIUM 8.7*  --  8.4* 8.5*  MG  --   --  2.2 1.9   Liver Function Tests: Recent Labs  Lab 02/08/23 1632 02/09/23 0505  AST 16 13*  ALT 19 15  ALKPHOS 155* 151*  BILITOT 1.0 0.8  PROT 7.3 6.9  ALBUMIN 2.7* 2.6*   Recent Labs  Lab 02/08/23 1632  LIPASE 31   Recent Labs  Lab 02/09/23 0505  AMMONIA 15   Coagulation Profile: Recent Labs  Lab 02/08/23 1632  INR 1.2   CBC: Recent Labs  Lab 02/08/23 1632 02/08/23 1658 02/09/23 0505 02/11/23 0438  WBC 38.8*  --  41.2* 33.9*  NEUTROABS 34.6*  --  37.0*  --   HGB 11.0* 11.9* 10.0* 9.9*  HCT 34.6* 35.0* 33.0* 31.6*  MCV 82.6  --  83.5 81.4  PLT 461*  --  462* 426*   Cardiac Enzymes: No results for input(s): "CKTOTAL", "CKMB", "CKMBINDEX", "TROPONINI" in the last 168 hours. BNP: Invalid input(s): "POCBNP" CBG: Recent Labs  Lab 02/08/23 1652  GLUCAP 99   HbA1C: No results for input(s): "HGBA1C" in the last 72 hours. Urine analysis:    Component Value Date/Time   COLORURINE YELLOW 02/09/2023 0500   APPEARANCEUR CLOUDY (A) 02/09/2023 0500   LABSPEC 1.037 (H) 02/09/2023 0500   PHURINE 5.0 02/09/2023 0500   GLUCOSEU >=500 (A) 02/09/2023 0500   HGBUR MODERATE (A) 02/09/2023 0500   BILIRUBINUR NEGATIVE 02/09/2023 0500   KETONESUR 80 (A) 02/09/2023 0500   PROTEINUR 30 (A) 02/09/2023 0500   NITRITE NEGATIVE 02/09/2023 0500   LEUKOCYTESUR MODERATE (A) 02/09/2023 0500   Sepsis Labs: @LABRCNTIP (procalcitonin:4,lacticidven:4) ) Recent Results  (from the past 240 hour(s))  Culture, blood (Routine X 2) w Reflex to ID Panel     Status: None (Preliminary result)   Collection Time: 02/09/23  5:05 AM   Specimen: BLOOD LEFT HAND  Result Value Ref Range Status   Specimen Description BLOOD LEFT HAND  Final   Special Requests   Final    BOTTLES DRAWN AEROBIC AND ANAEROBIC Blood Culture adequate volume   Culture   Final    NO GROWTH 2 DAYS Performed at [facility], [address] [place], [place] [address]    Report Status PENDING  Incomplete  Culture, blood (Routine X 2) w Reflex to ID Panel     Status: None (Preliminary result)   Collection Time: 02/09/23  5:07 AM   Specimen: BLOOD  Result Value Ref Range Status  Specimen Description BLOOD BLOOD LEFT HAND  Final   Special Requests   Final    BOTTLES DRAWN AEROBIC AND ANAEROBIC Blood Culture adequate volume   Culture   Final    NO GROWTH 2 DAYS Performed at [facility], [address], [place], [place] [address]    Report Status PENDING  Incomplete     Scheduled Meds:  acetaminophen  650 mg Oral Q6H   aspirin EC  81 mg Oral Daily   diltiazem  90 mg Oral BID   ezetimibe  10 mg Oral Daily   feeding supplement  237 mL Oral TID BM   [START ON 02/12/2023] heparin  5,000 Units Subcutaneous Q8H   levothyroxine  25 mcg Oral Q0600   megestrol  400 mg Oral Daily   multivitamin with minerals  1 tablet Oral Daily   rosuvastatin  40 mg Oral Daily   Continuous Infusions:  sodium chloride Stopped (02/10/23 1548)   sodium chloride 75 mL/hr at 02/11/23 1355   piperacillin-tazobactam (ZOSYN)  IV 3.375 g (02/11/23 1405)   potassium chloride 10 mEq (02/11/23 1712)    Procedures/Studies: CT BONE TROCAR/NEEDLE BIOPSY DEEP  Result Date: 02/11/2023 INDICATION: [id number] Lung cancer (HCC) [id number] RIGHT scapular mass. EXAM: CT-GUIDED RIGHT SCAPULAR MASS CORE BIOPSY COMPARISON:  CTA chest, 02/08/2023. MEDICATIONS: None. ANESTHESIA/SEDATION: Moderate (conscious) sedation was employed during this  procedure. A total of Versed 2 mg and Fentanyl 150 mcg was administered intravenously. Moderate Sedation Time: 21 minutes. The patient's level of consciousness and vital signs were monitored continuously by radiology nursing throughout the procedure under my direct supervision. CONTRAST:  None. COMPLICATIONS: None immediate. PROCEDURE: RADIATION DOSE REDUCTION: This exam was performed according to the departmental dose-optimization program which includes automated exposure control, adjustment of the mA and/or kV according to patient size and/or use of iterative reconstruction technique. Informed consent was obtained from the patient following an explanation of the procedure, risks, benefits and alternatives. A time out was performed prior to the initiation of the procedure. The patient was positioned prone on the CT table and a limited CT was performed for procedural planning demonstrating lytic RIGHT scapular mass. The procedure was planned. The operative site was prepped and draped in the usual sterile fashion. Appropriate trajectory was confirmed with a 22 gauge spinal needle after the adjacent tissues were anesthetized with 1% Lidocaine with epinephrine. Under intermittent CT guidance, a 17 gauge coaxial needle was advanced into the peripheral aspect of the mass. Appropriate positioning was confirmed and 2 samples were obtained with an 18 gauge core needle biopsy device. The co-axial needle was removed and hemostasis was achieved with manual compression. A limited postprocedural CT was negative for hemorrhage or additional complication. A dressing was placed. The patient tolerated the procedure well without immediate postprocedural complication. IMPRESSION: Successful CT guided core needle biopsy of RIGHT scapular mass. Roanna Banning, MD [facility] Electronically Signed   By: Roanna Banning M.D.   On: 02/11/2023 16:32   CT ABDOMEN PELVIS W  CONTRAST  Result Date: 02/10/2023 CLINICAL DATA:  Prominent intrathoracic malignancy especially involving the right upper lobe and mediastinum, inpatient assessment for staging workup. * Tracking Code: BO * EXAM: CT ABDOMEN AND PELVIS WITH CONTRAST TECHNIQUE: Multidetector CT imaging of the abdomen and pelvis was performed using the standard protocol following bolus administration of intravenous contrast. RADIATION DOSE REDUCTION: This exam was performed according to the departmental dose-optimization program which includes automated exposure control, adjustment of the mA and/or kV according  to patient size and/or use of iterative reconstruction technique. CONTRAST:  75mL OMNIPAQUE IOHEXOL 300 MG/ML  SOLN COMPARISON:  CT chest 02/08/2023 FINDINGS: Lower chest: Small right pleural effusion. Small pericardial effusion. Right coronary artery atheromatous vascular calcifications and left anterior descending coronary atherosclerosis. Hepatobiliary: Unremarkable Pancreas: Obscuration of the pancreatic head due to adjacent extensive adenopathy. I do not see a definite mass arising from the pancreatic head although the region is indistinct and difficult to characterize. No dorsal pancreatic duct dilatation. Spleen: Unremarkable Adrenals/Urinary Tract: Mixed density right adrenal mass measuring 5.2 by 3.9 cm with some internal calcifications and also internal macroscopic fat. 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 Left adrenal gland unremarkable. Urinary bladder and ureters unremarkable. Soft tissue nodule in the right posterior perirenal space 1.1 by 1.0 cm, possibly a metastatic lesion. Bilateral small nodules in the perirenal space suspicious for metastases occluding a 5 mm  posterior left perirenal space nodule on image 35 series 3 and a 1.6 by 1.3 cm lower perirenal space nodule on image 62 series 6. Other small perirenal space nodules are present bilaterally. Small hypodense lesions of the right kidney are probably cysts although technically too small to characterize. No further imaging workup of these lesions is indicated. Suspected periurethral diverticulum on image 89 series 3. Stomach/Bowel: Unremarkable Vascular/Lymphatic: Atherosclerosis is present, including aortoiliac atherosclerotic disease. Porta hepatis/peripancreatic node 2.6 cm in short axis on image 33 series 3. Aortocaval node along the posterior margin of the pancreatic head measures 2.7 cm in short axis on image 38 series 3. Peripancreatic lymph node on image 34 series 3 measures 1.2 cm short axis. Central mesenteric node on image 58 series 3 measures 1.7 cm in short axis. Adjacent central mesenteric lymph node on the same image measures 0.2 cm in short axis. Reproductive: Uterus absent.  Adnexa unremarkable. Other: Subcutaneous nodule along the left upper quadrant anteriorly, 0.5 cm in diameter on image 22 series 3. This is nonspecific. 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 IMPRESSION: 1. Extensive adenopathy in the porta hepatis/peripancreatic region, retroperitoneum, and central mesentery, compatible with metastatic disease. 2. Mixed density right adrenal mass with internal macroscopic fat and some internal calcifications. 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 3. 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 4. Small right pleural effusion. 5. Small pericardial effusion. 6.  Coronary atherosclerosis. 7. 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 8. 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 9. Aortic and coronary atherosclerosis. Aortic Atherosclerosis (ICD10-I70.0). Electronically Signed   By: Gaylyn Rong M.D.   On: 02/10/2023 18:48   CT Angio Chest PE W/Cm &/Or Wo Cm  Result Date: 02/08/2023 CLINICAL DATA:  Concern for pulmonary edema. Right upper lobe pneumonia/mass. EXAM: CT ANGIOGRAPHY CHEST WITH CONTRAST TECHNIQUE: Multidetector CT imaging of the chest was performed using the standard protocol during bolus administration of intravenous contrast. Multiplanar CT image reconstructions and MIPs were obtained to evaluate the vascular anatomy. RADIATION DOSE REDUCTION: This exam was performed  according to the departmental dose-optimization program which includes automated exposure control, adjustment of the mA and/or kV according to patient size and/or use of iterative reconstruction technique. CONTRAST:  75mL OMNIPAQUE IOHEXOL 350 MG/ML SOLN COMPARISON:  Chest radiograph dated 02/08/2023. FINDINGS: Evaluation of this exam is limited due to respiratory motion artifact. Cardiovascular: Top-normal cardiac size. Small pericardial effusion. Mild atherosclerotic calcification of the thoracic aorta. No aneurysmal dilatation or dissection. The origins of the great vessels of the aortic arch appear patent. Evaluation of the pulmonary arteries is limited due to respiratory motion. No pulmonary artery embolus identified. Mediastinum/Nodes: Right hilar mass/adenopathy measures approximately 5 x 7 cm. There is a large area of consolidation in the right upper lobe representing a combination of mass and post obstructive atelectasis or pneumonia. There is extension of the mass into the right apex with compression of the SVC. There is mass effect and compression of the right pulmonary artery with near complete  occlusion of the right upper lobe pulmonary artery and high-grade narrowing of the right middle and right lower lobe pulmonary artery branches. Apparent protrusion of the lobulated mass into the right pulmonary artery suggestive of vascular invasion and tumor thrombus. Additional mediastinal adenopathy in the anterior mediastinum and prevascular space. The esophagus is grossly unremarkable. Asymmetric enlargement and heterogeneity of the right thyroid lobe. This has been evaluated on previous imaging. (ref: J Am Coll Radiol. 2015 Feb;12(2): 143-50).No mediastinal fluid collection. Lungs/Pleura: Consolidative changes of the majority of the right upper lobe as above, combination of malignancy and postobstructive atelectasis/infiltrate. There is mass effect and complete compression of the right upper lobe bronchus. Additional lobulated subpleural mass in the right upper lobe measures approximately 2 x 4 cm. The left lung is clear. There is no pleural effusion or pneumothorax. There is mild compression of the right mainstem bronchus. The central airways remain patent. Upper Abdomen: Fatty liver. Indeterminate 3.5 x 5.0 cm right adrenal mass as well as partially visualized 2.7 x 2.4 cm hypodense mass in the uncinate process of the pancreas. Further evaluation of the abdominal masses with MRI without and with contrast on a nonemergent/outpatient basis recommended. Musculoskeletal: There is infiltrative mass involving the inferior right scapula with associated pathologic fracture. Osteopenia with degenerative changes of the spine. Review of the MIP images confirms the above findings. IMPRESSION: 1. No CT evidence of pulmonary embolism. 2. 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 Associated complete obstruction of the right upper lobe bronchus and compression of the SVC and invasion into the right pulmonary artery. 3. Mediastinal adenopathy. 4. Infiltrative mass involving the inferior right scapula  with associated pathologic fracture. 5. Indeterminate right adrenal mass and partially visualized hypodense mass in the uncinate process of the pancreas. Further evaluation with MRI without and with contrast on a nonemergent/outpatient basis recommended. 6.  Aortic Atherosclerosis (ICD10-I70.0). Electronically Signed   By: Elgie Collard M.D.   On: 02/08/2023 22:23   MR Brain Wo Contrast (neuro protocol)  Result Date: 02/08/2023 CLINICAL DATA:  Slurred speech, nausea and vomiting EXAM: MRI HEAD WITHOUT CONTRAST MRA HEAD WITHOUT CONTRAST TECHNIQUE: Multiplanar, multi-echo pulse sequences of the brain and surrounding structures were acquired without intravenous contrast. Angiographic images of the Circle of Willis were acquired using MRA technique without intravenous contrast. COMPARISON:  None Available. FINDINGS: MRI HEAD FINDINGS Brain: No acute infarct, mass effect or extra-axial collection. No chronic microhemorrhage or siderosis. Normal white matter signal, parenchymal volume and CSF spaces. The midline structures are normal. 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 Vascular: Normal flow voids. Skull and upper cervical spine: Normal marrow signal. Sinuses/Orbits: No acute or significant finding. Other: None. MRA HEAD FINDINGS POSTERIOR CIRCULATION: --Vertebral arteries: Normal --Inferior cerebellar arteries: Normal. --Basilar artery: Normal. --Superior cerebellar arteries: Normal. --Posterior cerebral arteries: Normal. ANTERIOR CIRCULATION: --Intracranial internal carotid arteries: Normal. --Anterior cerebral arteries (ACA): Normal. --Middle cerebral arteries (MCA): Normal. Anatomic variants: None IMPRESSION: 1. No acute intracranial abnormality. 2. 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 3. Normal intracranial MRA. Electronically Signed   By: Deatra Robinson M.D.   On: 02/08/2023 21:45   MR Angio Head WO CONTRAST  Result Date: 02/08/2023 CLINICAL DATA:  Slurred speech, nausea and vomiting EXAM: MRI  HEAD WITHOUT CONTRAST MRA HEAD WITHOUT CONTRAST TECHNIQUE: Multiplanar, multi-echo pulse sequences of the brain and surrounding structures were acquired without intravenous contrast. Angiographic images of the Circle of Willis were acquired using MRA technique without intravenous contrast. COMPARISON:  None Available. FINDINGS: MRI HEAD FINDINGS Brain: No acute infarct, mass effect or extra-axial collection. No chronic microhemorrhage or siderosis. Normal white matter signal, parenchymal volume and CSF spaces. The midline structures are normal. 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 Vascular: Normal flow voids. Skull and upper cervical spine: Normal marrow signal. Sinuses/Orbits: No acute or significant finding. Other: None. MRA HEAD FINDINGS POSTERIOR CIRCULATION: --Vertebral arteries: Normal --Inferior cerebellar arteries: Normal. --Basilar artery: Normal. --Superior cerebellar arteries: Normal. --Posterior cerebral arteries: Normal. ANTERIOR CIRCULATION: --Intracranial internal carotid arteries: Normal. --Anterior cerebral arteries (ACA): Normal. --Middle cerebral arteries (MCA): Normal. Anatomic variants: None IMPRESSION: 1. No acute intracranial abnormality. 2. 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 3. Normal intracranial MRA. Electronically Signed   By: Deatra Robinson M.D.   On: 02/08/2023 21:45   DG Chest Port 1 View  Result Date: 02/08/2023 CLINICAL DATA:  Shortness of breath EXAM: PORTABLE CHEST 1 VIEW COMPARISON:  None Available. FINDINGS: Low lung volumes. Borderline cardiomegaly. Dense right upper lobe opacity. No pneumothorax or pleural effusion IMPRESSION: Low lung volumes. Dense right upper lobe opacity, possible pneumonia though neoplasm could also produce this appearance. Consider correlation with chest CT Electronically Signed   By: Jasmine Pang M.D.   On: 02/08/2023 19:14   DG Shoulder Right  Result Date: 02/08/2023 CLINICAL DATA:  Right shoulder pain for 2 weeks EXAM: RIGHT SHOULDER  - 2+ VIEW COMPARISON:  None Available. FINDINGS: Mild AC joint and glenohumeral degenerative change. No fracture or malalignment. Partially visualized right upper lobe opacity IMPRESSION: 1. Mild degenerative change. 2. Partially visualized right upper lobe opacity, recommend chest radiograph. Electronically Signed   By: Jasmine Pang M.D.   On: 02/08/2023 19:13   CT HEAD CODE STROKE WO CONTRAST  Result Date: 02/08/2023 CLINICAL DATA:  Code stroke. Neuro deficit, acute, stroke suspected. EXAM: CT HEAD WITHOUT CONTRAST TECHNIQUE: Contiguous axial images were obtained from the base of the skull through the vertex without intravenous contrast. RADIATION DOSE REDUCTION: This exam was performed according to the departmental dose-optimization program which includes automated exposure control, adjustment of the mA and/or kV according to patient size and/or use of iterative reconstruction technique. COMPARISON:  None. FINDINGS: Brain: Cerebral volume is normal. There is no acute intracranial hemorrhage. No demarcated cortical infarct. No extra-axial fluid collection. No evidence of an intracranial mass. No midline shift. Vascular: No hyperdense vessel.  Atherosclerotic calcifications. Skull: No calvarial fracture or aggressive osseous lesion. Sinuses/Orbits: No mass or acute finding within the imaged orbits. No significant paranasal sinus disease at the imaged levels. ASPECTS [facility] Stroke Program Early CT Score) -  Ganglionic level infarction (caudate, lentiform nuclei, internal capsule, insula, M1-M3 cortex): 7 - Supraganglionic infarction (M4-M6 cortex): 3 Total score (0-10 with 10 being normal): 10 No evidence of an acute intracranial abnormality. These results were called by telephone at the time of interpretation on 02/08/2023 at 5:07 pm to provider Vanetta Mulders , who verbally acknowledged these results. IMPRESSION: No evidence of an acute intracranial abnormality. Electronically Signed   By: Jackey Loge D.O.    On: 02/08/2023 17:08    Catarina Hartshorn, DO  [facility]  If 7PM-7AM, please contact night-coverage [email or web address] Password [email or web address] 02/11/2023, 6:06 PM   LOS: 3 days

## 2023-02-11 NOTE — Progress Notes (Signed)
MD aware of potassium 2.7.

## 2023-02-11 NOTE — Consult Note (Signed)
Chief Complaint: Metastatic cancer  Referring Provider(s): Catarina Hartshorn  Supervising Physician: Roanna Banning  Patient Status: Christine Poole Inpatient  History of Present Illness: Christine Poole is a 72 y.o. female with medical history significant of arthritis, glaucoma, hypothyroidism, hypertension, hyperlipidemia, and more presents to the ED with a chief complaint of generalized weakness.    She has lost about 23 pounds in a 1 month.    She reports increased shortness of breath and cough.    She gets pain in her right shoulder every time she coughs.  CTA showed = -Large right upper lobe malignancy extending into the right hilum. -Associated complete obstruction of the right upper lobe bronchus and compression of the SVC and invasion into the right pulmonary artery. -Mediastinal adenopathy. -Infiltrative mass involving the inferior right scapula with associated pathologic fracture.   CT of the Abd/Pelvis showed= 1. Extensive adenopathy in the porta hepatis/peripancreatic region, retroperitoneum, and central mesentery, compatible with metastatic disease. 2. Mixed density right adrenal mass with internal macroscopic fat and some internal calcifications. 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 3. 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  We are asked to perform an image guided biopsy for tissue diagnosis.  Images reviewed and the right scapula may be the safest option for biopsy.  She is NPO.   Patient is Full Code  Past Medical History:  Diagnosis Date   Arthritis    Glaucoma    Hyperplastic polyp of large intestine    Hypertension     Past Surgical History:  Procedure Laterality Date   ABDOMINAL HYSTERECTOMY      COLONOSCOPY N/A 08/08/2012   HQI:ONGEXB polyps and internal hemorrhoids.Status post removal rectal polyps   COLONOSCOPY N/A 04/16/2014   Procedure: COLONOSCOPY;  Surgeon: Corbin Ade, MD;  Location: [facility];  Service: Endoscopy;  Laterality: N/A;  1:30pm   KNEE ARTHROSCOPY Left     Allergies: Patient has no known allergies.  Medications: Prior to Admission medications   Medication Sig Start Date End Date Taking? Authorizing Provider  aspirin 81 MG tablet Take 81 mg by mouth daily.   Yes [provider]  diltiazem (CARDIZEM) 90 MG tablet Take 90 mg by mouth 2 (two) times daily.   Yes [provider]  etodolac (LODINE) 400 MG tablet Take 400 mg by mouth 2 (two) times daily.   Yes [provider]  ezetimibe (ZETIA) 10 MG tablet Take 10 mg by mouth daily. 05/31/17  Yes [provider]  JANUMET XR [phone] MG TB24 Take 1 tablet by mouth daily. 12/14/22  Yes Reardon, Freddi Starr, NP  JARDIANCE 25 MG TABS tablet Take 1 tablet (25 mg total) by mouth daily. 12/14/22  Yes Reardon, Freddi Starr, NP  latanoprost (XALATAN) 0.005 % ophthalmic solution 1 drop at bedtime.   Yes [provider]  levothyroxine (SYNTHROID) 25 MCG tablet Take 1 tablet (25 mcg total) by mouth daily before breakfast. 12/14/22  Yes Reardon, Freddi Starr, NP  lisinopril-hydrochlorothiazide (PRINZIDE,ZESTORETIC) 20-25 MG per tablet Take 1 tablet by mouth daily.   Yes [provider]  Multiple Vitamin (MULTIVITAMIN) tablet Take 1 tablet by mouth daily.   Yes [provider]  PREVIDENT 5000 ENAMEL PROTECT 1.1-5 % GEL Take 1 Application by mouth daily. Use on teeth once a day.  11/11/22  Yes [provider]  rosuvastatin (CRESTOR) 40 MG tablet Take 40 mg by mouth daily. 02/29/20  Yes [provider]     Family History  Problem Relation Age of Onset   Diabetes Mother    Hypertension Mother    Hyperlipidemia Mother    Heart attack Mother     Social History    Socioeconomic History   Marital status: Married    Spouse name: Not on file   Number of children: Not on file   Years of education: Not on file   Highest education level: Not on file  Occupational History   Not on file  Tobacco Use   Smoking status: Former    Current packs/day: 0.00    Types: Cigarettes   Smokeless tobacco: Never  Vaping Use   Vaping status: Never Used  Substance and Sexual Activity   Alcohol use: No   Drug use: No   Sexual activity: Yes    Birth control/protection: Surgical  Other Topics Concern   Not on file  Social History Narrative   Not on file   Social Determinants of Health   Financial Resource Strain: Not on file  Food Insecurity: No Food Insecurity (02/09/2023)   Hunger Vital Sign    Worried About Running Out of Food in the Last Year: Never true    Ran Out of Food in the Last Year: Never true  Transportation Needs: No Transportation Needs (02/09/2023)   PRAPARE - Administrator, Civil Service (Medical): No    Lack of Transportation (Non-Medical): No  Physical Activity: Not on file  Stress: Not on file  Social Connections: Not on file     Review of Systems: A 12 point ROS discussed and pertinent positives are indicated in the HPI above.  All other systems are negative.    Vital Signs: BP 131/86 (BP Location: Left Arm)   Pulse (!) 109   Temp 98.5 F (36.9 C) (Oral)   Resp 18   Ht 5\' 6"  (1.676 m)   Wt 163 lb 9.3 oz (74.2 kg)   SpO2 95%   BMI 26.40 kg/m   Advance Care Plan: The advanced care place/surrogate decision maker was discussed at the time of visit and the patient did not wish to discuss or was not able to name a surrogate decision maker or provide an advance care plan.  Physical Exam Vitals reviewed.  Constitutional:      Appearance: Normal appearance.  HENT:     Head: Normocephalic and atraumatic.  Eyes:     Extraocular Movements: Extraocular movements intact.  Cardiovascular:     Rate and Rhythm: Normal  rate and regular rhythm.  Pulmonary:     Effort: Pulmonary effort is normal. No respiratory distress.     Breath sounds: Normal breath sounds.  Abdominal:     Palpations: Abdomen is soft.  Musculoskeletal:        General: Normal range of motion.     Cervical back: Normal range of motion.     Comments: Tender right scapula  Skin:    General: Skin is warm and dry.  Neurological:     General: No focal deficit present.     Mental Status: She is alert and oriented to person, place, and time.  Psychiatric:        Mood and Affect: Mood normal.        Behavior: Behavior normal.        Thought Content: Thought content normal.  Judgment: Judgment normal.     Imaging: CT ABDOMEN PELVIS W CONTRAST  Result Date: 02/10/2023 CLINICAL DATA:  Prominent intrathoracic malignancy especially involving the right upper lobe and mediastinum, inpatient assessment for staging workup. * Tracking Code: BO * EXAM: CT ABDOMEN AND PELVIS WITH CONTRAST TECHNIQUE: Multidetector CT imaging of the abdomen and pelvis was performed using the standard protocol following bolus administration of intravenous contrast. RADIATION DOSE REDUCTION: This exam was performed according to the departmental dose-optimization program which includes automated exposure control, adjustment of the mA and/or kV according to patient size and/or use of iterative reconstruction technique. CONTRAST:  75mL OMNIPAQUE IOHEXOL 300 MG/ML  SOLN COMPARISON:  CT chest 02/08/2023 FINDINGS: Lower chest: Small right pleural effusion. Small pericardial effusion. Right coronary artery atheromatous vascular calcifications and left anterior descending coronary atherosclerosis. Hepatobiliary: Unremarkable Pancreas: Obscuration of the pancreatic head due to adjacent extensive adenopathy. I do not see a definite mass arising from the pancreatic head although the region is indistinct and difficult to characterize. No dorsal pancreatic duct dilatation. Spleen:  Unremarkable Adrenals/Urinary Tract: Mixed density right adrenal mass measuring 5.2 by 3.9 cm with some internal calcifications and also internal macroscopic fat. 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 Left adrenal gland unremarkable. Urinary bladder and ureters unremarkable. Soft tissue nodule in the right posterior perirenal space 1.1 by 1.0 cm, possibly a metastatic lesion. Bilateral small nodules in the perirenal space suspicious for metastases occluding a 5 mm posterior left perirenal space nodule on image 35 series 3 and a 1.6 by 1.3 cm lower perirenal space nodule on image 62 series 6. Other small perirenal space nodules are present bilaterally. Small hypodense lesions of the right kidney are probably cysts although technically too small to characterize. No further imaging workup of these lesions is indicated. Suspected periurethral diverticulum on image 89 series 3. Stomach/Bowel: Unremarkable Vascular/Lymphatic: Atherosclerosis is present, including aortoiliac atherosclerotic disease. Porta hepatis/peripancreatic node 2.6 cm in short axis on image 33 series 3. Aortocaval node along the posterior margin of the pancreatic head measures 2.7 cm in short axis on image 38 series 3. Peripancreatic lymph node on image 34 series 3 measures 1.2 cm short axis. Central mesenteric node on image 58 series 3 measures 1.7 cm in short axis. Adjacent central mesenteric lymph node on the same image measures 0.2 cm in short axis. Reproductive: Uterus absent.  Adnexa unremarkable. Other: Subcutaneous nodule along the left upper quadrant anteriorly, 0.5 cm in diameter on image 22 series 3. This is nonspecific. Musculoskeletal: Degenerative disc disease and spondylosis in the lower  lumbar spine contributing to foraminal impingement at the L4-5 and L5-S1 levels. IMPRESSION: 1. Extensive adenopathy in the porta hepatis/peripancreatic region, retroperitoneum, and central mesentery, compatible with metastatic disease. 2. Mixed density right adrenal mass with internal macroscopic fat and some internal calcifications. 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 3. 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 4. Small right pleural effusion. 5. Small pericardial effusion. 6. Coronary atherosclerosis. 7. 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 8. Suspected  periurethral diverticulum. 9. Aortic and coronary atherosclerosis. Aortic Atherosclerosis (ICD10-I70.0). Electronically Signed   By: Gaylyn Rong M.D.   On: 02/10/2023 18:48   CT Angio Chest PE W/Cm &/Or Wo Cm  Result Date: 02/08/2023 CLINICAL DATA:  Concern for pulmonary edema. Right upper lobe pneumonia/mass. EXAM: CT ANGIOGRAPHY CHEST WITH CONTRAST TECHNIQUE: Multidetector CT imaging of the chest was performed using the standard protocol during bolus administration of intravenous contrast. Multiplanar CT image reconstructions and MIPs were obtained to evaluate the vascular anatomy. RADIATION DOSE REDUCTION: This exam was performed according to the departmental dose-optimization program which includes automated exposure control, adjustment of the mA and/or kV according to patient size and/or use of iterative reconstruction technique. CONTRAST:  75mL OMNIPAQUE IOHEXOL 350 MG/ML SOLN COMPARISON:  Chest radiograph dated 02/08/2023. FINDINGS: Evaluation of this exam is limited due to respiratory motion artifact. Cardiovascular:  Top-normal cardiac size. Small pericardial effusion. Mild atherosclerotic calcification of the thoracic aorta. No aneurysmal dilatation or dissection. The origins of the great vessels of the aortic arch appear patent. Evaluation of the pulmonary arteries is limited due to respiratory motion. No pulmonary artery embolus identified. Mediastinum/Nodes: Right hilar mass/adenopathy measures approximately 5 x 7 cm. There is a large area of consolidation in the right upper lobe representing a combination of mass and post obstructive atelectasis or pneumonia. There is extension of the mass into the right apex with compression of the SVC. There is mass effect and compression of the right pulmonary artery with near complete occlusion of the right upper lobe pulmonary artery and high-grade narrowing of the right middle and right lower lobe pulmonary artery branches. Apparent protrusion of the lobulated mass into the right pulmonary artery suggestive of vascular invasion and tumor thrombus. Additional mediastinal adenopathy in the anterior mediastinum and prevascular space. The esophagus is grossly unremarkable. Asymmetric enlargement and heterogeneity of the right thyroid lobe. This has been evaluated on previous imaging. (ref: J Am Coll Radiol. 2015 Feb;12(2): 143-50).No mediastinal fluid collection. Lungs/Pleura: Consolidative changes of the majority of the right upper lobe as above, combination of malignancy and postobstructive atelectasis/infiltrate. There is mass effect and complete compression of the right upper lobe bronchus. Additional lobulated subpleural mass in the right upper lobe measures approximately 2 x 4 cm. The left lung is clear. There is no pleural effusion or pneumothorax. There is mild compression of the right mainstem bronchus. The central airways remain patent. Upper Abdomen: Fatty liver. Indeterminate 3.5 x 5.0 cm right adrenal mass as well as partially visualized 2.7 x 2.4 cm hypodense mass in the  uncinate process of the pancreas. Further evaluation of the abdominal masses with MRI without and with contrast on a nonemergent/outpatient basis recommended. Musculoskeletal: There is infiltrative mass involving the inferior right scapula with associated pathologic fracture. Osteopenia with degenerative changes of the spine. Review of the MIP images confirms the above findings. IMPRESSION: 1. No CT evidence of pulmonary embolism. 2. 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 Associated complete obstruction of the right upper lobe bronchus and compression of the SVC and invasion into the right pulmonary artery. 3. Mediastinal adenopathy. 4. Infiltrative mass involving the inferior right scapula with associated pathologic fracture. 5. Indeterminate right adrenal mass and partially visualized hypodense mass in the uncinate process of the pancreas. Further evaluation with MRI without and with contrast on a nonemergent/outpatient basis recommended. 6.  Aortic Atherosclerosis (ICD10-I70.0). Electronically Signed   By: Elgie Collard M.D.   On: 02/08/2023 22:23   MR Brain Wo Contrast (  neuro protocol)  Result Date: 02/08/2023 CLINICAL DATA:  Slurred speech, nausea and vomiting EXAM: MRI HEAD WITHOUT CONTRAST MRA HEAD WITHOUT CONTRAST TECHNIQUE: Multiplanar, multi-echo pulse sequences of the brain and surrounding structures were acquired without intravenous contrast. Angiographic images of the Circle of Willis were acquired using MRA technique without intravenous contrast. COMPARISON:  None Available. FINDINGS: MRI HEAD FINDINGS Brain: No acute infarct, mass effect or extra-axial collection. No chronic microhemorrhage or siderosis. Normal white matter signal, parenchymal volume and CSF spaces. The midline structures are normal. 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 Vascular: Normal flow voids. Skull and upper cervical spine: Normal marrow signal. Sinuses/Orbits: No acute or significant  finding. Other: None. MRA HEAD FINDINGS POSTERIOR CIRCULATION: --Vertebral arteries: Normal --Inferior cerebellar arteries: Normal. --Basilar artery: Normal. --Superior cerebellar arteries: Normal. --Posterior cerebral arteries: Normal. ANTERIOR CIRCULATION: --Intracranial internal carotid arteries: Normal. --Anterior cerebral arteries (ACA): Normal. --Middle cerebral arteries (MCA): Normal. Anatomic variants: None IMPRESSION: 1. No acute intracranial abnormality. 2. 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 3. Normal intracranial MRA. Electronically Signed   By: Deatra Robinson M.D.   On: 02/08/2023 21:45   MR Angio Head WO CONTRAST  Result Date: 02/08/2023 CLINICAL DATA:  Slurred speech, nausea and vomiting EXAM: MRI HEAD WITHOUT CONTRAST MRA HEAD WITHOUT CONTRAST TECHNIQUE: Multiplanar, multi-echo pulse sequences of the brain and surrounding structures were acquired without intravenous contrast. Angiographic images of the Circle of Willis were acquired using MRA technique without intravenous contrast. COMPARISON:  None Available. FINDINGS: MRI HEAD FINDINGS Brain: No acute infarct, mass effect or extra-axial collection. No chronic microhemorrhage or siderosis. Normal white matter signal, parenchymal volume and CSF spaces. The midline structures are normal. 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 Vascular: Normal flow voids. Skull and upper cervical spine: Normal marrow signal. Sinuses/Orbits: No acute or significant finding. Other: None. MRA HEAD FINDINGS POSTERIOR CIRCULATION: --Vertebral arteries: Normal --Inferior cerebellar arteries: Normal. --Basilar artery: Normal. --Superior cerebellar arteries: Normal. --Posterior cerebral arteries: Normal. ANTERIOR CIRCULATION: --Intracranial internal carotid arteries: Normal. --Anterior cerebral arteries (ACA): Normal. --Middle cerebral arteries (MCA): Normal. Anatomic variants: None IMPRESSION: 1. No acute intracranial abnormality. 2. 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 3. Normal intracranial MRA. Electronically Signed   By: Deatra Robinson M.D.   On: 02/08/2023 21:45   DG Chest Port 1 View  Result Date: 02/08/2023 CLINICAL DATA:  Shortness of breath EXAM: PORTABLE CHEST 1 VIEW COMPARISON:  None Available. FINDINGS: Low lung volumes. Borderline cardiomegaly. Dense right upper lobe opacity. No pneumothorax or pleural effusion IMPRESSION: Low lung volumes. Dense right upper lobe opacity, possible pneumonia though neoplasm could also produce this appearance. Consider correlation with chest CT Electronically Signed   By: Jasmine Pang M.D.   On: 02/08/2023 19:14   DG Shoulder Right  Result Date: 02/08/2023 CLINICAL DATA:  Right shoulder pain for 2 weeks EXAM: RIGHT SHOULDER - 2+ VIEW COMPARISON:  None Available. FINDINGS: Mild AC joint and glenohumeral degenerative change. No fracture or malalignment. Partially visualized right upper lobe opacity IMPRESSION: 1. Mild degenerative change. 2. Partially visualized right upper lobe opacity, recommend chest radiograph. Electronically Signed   By: Jasmine Pang M.D.   On: 02/08/2023 19:13   CT HEAD CODE STROKE WO CONTRAST  Result Date: 02/08/2023 CLINICAL DATA:  Code stroke. Neuro deficit, acute, stroke suspected. EXAM: CT HEAD WITHOUT CONTRAST TECHNIQUE: Contiguous axial images were obtained from the base of the skull through the vertex without intravenous contrast. RADIATION DOSE REDUCTION: This exam was performed according to the departmental dose-optimization program which  includes automated exposure control, adjustment of the mA and/or kV according to patient size and/or use of iterative reconstruction technique. COMPARISON:  None. FINDINGS: Brain: Cerebral volume is normal. There is no acute intracranial hemorrhage. No demarcated cortical infarct. No extra-axial fluid collection. No evidence of an intracranial mass. No midline shift. Vascular: No hyperdense vessel.  Atherosclerotic calcifications. Skull: No  calvarial fracture or aggressive osseous lesion. Sinuses/Orbits: No mass or acute finding within the imaged orbits. No significant paranasal sinus disease at the imaged levels. ASPECTS [facility] Stroke Program Early CT Score) - Ganglionic level infarction (caudate, lentiform nuclei, internal capsule, insula, M1-M3 cortex): 7 - Supraganglionic infarction (M4-M6 cortex): 3 Total score (0-10 with 10 being normal): 10 No evidence of an acute intracranial abnormality. These results were called by telephone at the time of interpretation on 02/08/2023 at 5:07 pm to provider Vanetta Mulders , who verbally acknowledged these results. IMPRESSION: No evidence of an acute intracranial abnormality. Electronically Signed   By: Jackey Loge D.O.   On: 02/08/2023 17:08    Labs:  CBC: Recent Labs    02/08/23 1632 02/08/23 1658 02/09/23 0505 02/11/23 0438  WBC 38.8*  --  41.2* 33.9*  HGB 11.0* 11.9* 10.0* 9.9*  HCT 34.6* 35.0* 33.0* 31.6*  PLT 461*  --  462* 426*    COAGS: Recent Labs    02/08/23 1632  INR 1.2  APTT 32    BMP: Recent Labs    02/08/23 1632 02/08/23 1658 02/09/23 0505 02/11/23 0438  NA 134* 134* 133* 130*  K 3.3* 3.6 3.9 2.7*  CL 98 99 99 95*  CO2 17*  --  15* 22  GLUCOSE 109* 104* 69* 122*  BUN 17 15 15  7*  CALCIUM 8.7*  --  8.4* 8.5*  CREATININE 0.90 0.60 0.75 0.59  GFRNONAA >60  --  >60 >60    LIVER FUNCTION TESTS: Recent Labs    02/08/23 1632 02/09/23 0505  BILITOT 1.0 0.8  AST 16 13*  ALT 19 15  ALKPHOS 155* 151*  PROT 7.3 6.9  ALBUMIN 2.7* 2.6*    TUMOR MARKERS: No results for input(s): "AFPTM", "CEA", "CA199", "CHROMGRNA" in the last 8760 hours.  Assessment and Plan:  Infiltrative mass involving the inferior right scapula with associated pathologic fracture.  Will proceed with image guided biopsy of the right scapula today by Dr. Milford Cage.  Risks and benefits of right scapula biopsy was discussed with the patient and/or patient's family including, but  not limited to bleeding, infection, damage to adjacent structures or low yield requiring additional tests.  All of the questions were answered and there is agreement to proceed.  Consent signed and in chart.  Thank you for allowing our service to participate in Christine Poole 's care.  Electronically Signed: Gwynneth Macleod, PA-C   02/11/2023, 11:20 AM      I spent a total of 40 Minutes  in face to face in clinical consultation, greater than 50% of which was counseling/coordinating care for scapula biopsy.

## 2023-02-12 ENCOUNTER — Inpatient Hospital Stay (HOSPITAL_COMMUNITY): Payer: BC Managed Care – PPO

## 2023-02-12 ENCOUNTER — Inpatient Hospital Stay (HOSPITAL_COMMUNITY): Payer: BC Managed Care – PPO | Admitting: Anesthesiology

## 2023-02-12 ENCOUNTER — Other Ambulatory Visit: Payer: Self-pay

## 2023-02-12 ENCOUNTER — Encounter (HOSPITAL_COMMUNITY)
Admission: EM | Disposition: A | Discharge status: Hospice | Payer: Self-pay | Source: Home / Self Care | Attending: Internal Medicine

## 2023-02-12 DIAGNOSIS — K668 Other specified disorders of peritoneum: Secondary | ICD-10-CM

## 2023-02-12 DIAGNOSIS — J181 Lobar pneumonia, unspecified organism: Secondary | ICD-10-CM | POA: Diagnosis not present

## 2023-02-12 DIAGNOSIS — J189 Pneumonia, unspecified organism: Secondary | ICD-10-CM | POA: Diagnosis not present

## 2023-02-12 DIAGNOSIS — K631 Perforation of intestine (nontraumatic): Secondary | ICD-10-CM | POA: Diagnosis not present

## 2023-02-12 DIAGNOSIS — E876 Hypokalemia: Secondary | ICD-10-CM | POA: Diagnosis not present

## 2023-02-12 DIAGNOSIS — A419 Sepsis, unspecified organism: Secondary | ICD-10-CM | POA: Diagnosis not present

## 2023-02-12 HISTORY — PX: BOWEL RESECTION: SHX1257

## 2023-02-12 HISTORY — PX: LAPAROTOMY: SHX154

## 2023-02-12 LAB — CBC
HCT: 34.3 % — ABNORMAL LOW (ref 36.0–46.0)
Hemoglobin: 10.5 g/dL — ABNORMAL LOW (ref 12.0–15.0)
MCH: 25.2 pg — ABNORMAL LOW (ref 26.0–34.0)
MCHC: 30.6 g/dL (ref 30.0–36.0)
MCV: 82.5 fL (ref 80.0–100.0)
Platelets: 416 10*3/uL — ABNORMAL HIGH (ref 150–400)
RBC: 4.16 MIL/uL (ref 3.87–5.11)
RDW: 16.9 % — ABNORMAL HIGH (ref 11.5–15.5)
WBC: 39.9 10*3/uL — ABNORMAL HIGH (ref 4.0–10.5)
nRBC: 0.1 % (ref 0.0–0.2)

## 2023-02-12 LAB — D-DIMER, QUANTITATIVE: D-Dimer, Quant: 8.49 ug{FEU}/mL — ABNORMAL HIGH (ref 0.00–0.50)

## 2023-02-12 LAB — BASIC METABOLIC PANEL
Anion gap: 16 — ABNORMAL HIGH (ref 5–15)
BUN: 10 mg/dL (ref 8–23)
CO2: 18 mmol/L — ABNORMAL LOW (ref 22–32)
Calcium: 8.6 mg/dL — ABNORMAL LOW (ref 8.9–10.3)
Chloride: 98 mmol/L (ref 98–111)
Creatinine, Ser: 0.56 mg/dL (ref 0.44–1.00)
GFR, Estimated: >60 mL/min (ref >60)
Glucose, Bld: 106 mg/dL — ABNORMAL HIGH (ref 70–99)
Potassium: 3 mmol/L — ABNORMAL LOW (ref 3.5–5.1)
Sodium: 132 mmol/L — ABNORMAL LOW (ref 135–145)

## 2023-02-12 LAB — MAGNESIUM: Magnesium: 1.9 mg/dL (ref 1.7–2.4)

## 2023-02-12 LAB — TYPE AND SCREEN
ABO/RH(D): B POS
Antibody Screen: NEGATIVE

## 2023-02-12 SURGERY — LAPAROTOMY, EXPLORATORY
Anesthesia: General | Site: Abdomen

## 2023-02-12 MED ORDER — MIDAZOLAM HCL 5 MG/5ML IJ SOLN
INTRAMUSCULAR | Status: DC | PRN
  Administered 2023-02-12: 2 mg via INTRAVENOUS

## 2023-02-12 MED ORDER — POTASSIUM CHLORIDE CRYS ER 20 MEQ PO TBCR
40.0000 meq | EXTENDED_RELEASE_TABLET | Freq: Once | ORAL | Status: AC
  Administered 2023-02-12: 40 meq via ORAL
  Filled 2023-02-12: qty 2

## 2023-02-12 MED ORDER — FENTANYL CITRATE (PF) 100 MCG/2ML IJ SOLN
INTRAMUSCULAR | Status: AC
  Filled 2023-02-12: qty 2

## 2023-02-12 MED ORDER — LIDOCAINE HCL (CARDIAC) PF 100 MG/5ML IV SOSY
PREFILLED_SYRINGE | INTRAVENOUS | Status: DC | PRN
  Administered 2023-02-12: 100 mg via INTRAVENOUS

## 2023-02-12 MED ORDER — SODIUM CHLORIDE 0.9 % IV SOLN
2.0000 g | INTRAVENOUS | Status: AC
  Administered 2023-02-12: 2 g via INTRAVENOUS

## 2023-02-12 MED ORDER — SUGAMMADEX SODIUM 200 MG/2ML IV SOLN
INTRAVENOUS | Status: DC | PRN
Start: 2023-02-12 — End: 2023-02-12
  Administered 2023-02-12: 200 mg via INTRAVENOUS

## 2023-02-12 MED ORDER — PROPOFOL 10 MG/ML IV BOLUS
INTRAVENOUS | Status: DC | PRN
  Administered 2023-02-12: 200 mg via INTRAVENOUS

## 2023-02-12 MED ORDER — METOPROLOL TARTRATE 5 MG/5ML IV SOLN
INTRAVENOUS | Status: DC | PRN
Start: 2023-02-12 — End: 2023-02-12
  Administered 2023-02-12 (×2): 2 mg via INTRAVENOUS

## 2023-02-12 MED ORDER — FENTANYL CITRATE (PF) 100 MCG/2ML IJ SOLN
INTRAMUSCULAR | Status: DC | PRN
  Administered 2023-02-12: 100 ug via INTRAVENOUS
  Administered 2023-02-12 (×2): 50 ug via INTRAVENOUS

## 2023-02-12 MED ORDER — CHLORHEXIDINE GLUCONATE CLOTH 2 % EX PADS: 6.0000 | MEDICATED_PAD | Freq: Once | CUTANEOUS | Status: DC

## 2023-02-12 MED ORDER — LACTATED RINGERS IV SOLN: INTRAVENOUS | Status: DC

## 2023-02-12 MED ORDER — LIDOCAINE HCL (PF) 2 % IJ SOLN
INTRAMUSCULAR | Status: AC
  Filled 2023-02-12: qty 5

## 2023-02-12 MED ORDER — PHENYLEPHRINE 80 MCG/ML (10ML) SYRINGE FOR IV PUSH (FOR BLOOD PRESSURE SUPPORT)
PREFILLED_SYRINGE | INTRAVENOUS | Status: AC
  Filled 2023-02-12: qty 10

## 2023-02-12 MED ORDER — HYDROMORPHONE HCL 1 MG/ML IJ SOLN: 0.2500 mg | INTRAMUSCULAR | Status: DC | PRN

## 2023-02-12 MED ORDER — SODIUM CHLORIDE 0.9 % IV SOLN
INTRAVENOUS | Status: AC
  Filled 2023-02-12: qty 2

## 2023-02-12 MED ORDER — IOHEXOL 350 MG/ML SOLN
75.0000 mL | Freq: Once | INTRAVENOUS | Status: AC | PRN
  Administered 2023-02-12: 75 mL via INTRAVENOUS

## 2023-02-12 MED ORDER — BUPIVACAINE HCL (PF) 0.5 % IJ SOLN
INTRAMUSCULAR | Status: DC | PRN
  Administered 2023-02-12: 30 mL

## 2023-02-12 MED ORDER — PROPOFOL 500 MG/50ML IV EMUL
INTRAVENOUS | Status: AC
  Filled 2023-02-12: qty 50

## 2023-02-12 MED ORDER — MIDAZOLAM HCL 2 MG/2ML IJ SOLN
INTRAMUSCULAR | Status: AC
  Filled 2023-02-12: qty 2

## 2023-02-12 MED ORDER — LACTATED RINGERS IV SOLN
INTRAVENOUS | Status: DC | PRN
Start: 2023-02-12 — End: 2023-02-12

## 2023-02-12 MED ORDER — ROCURONIUM BROMIDE 10 MG/ML (PF) SYRINGE
PREFILLED_SYRINGE | INTRAVENOUS | Status: DC | PRN
  Administered 2023-02-12: 60 mg via INTRAVENOUS

## 2023-02-12 MED ORDER — PHENYLEPHRINE HCL (PRESSORS) 10 MG/ML IV SOLN
INTRAVENOUS | Status: DC | PRN
  Administered 2023-02-12: 80 ug via INTRAVENOUS

## 2023-02-12 MED ORDER — SODIUM CHLORIDE 0.9 % IR SOLN
Status: DC | PRN
  Administered 2023-02-12: 2000 mL
  Administered 2023-02-12 (×2): 1000 mL

## 2023-02-12 MED ORDER — ROCURONIUM BROMIDE 10 MG/ML (PF) SYRINGE
PREFILLED_SYRINGE | INTRAVENOUS | Status: AC
  Filled 2023-02-12: qty 10

## 2023-02-12 MED ORDER — ATROPINE SULFATE 0.4 MG/ML IV SOLN
INTRAVENOUS | Status: AC
  Filled 2023-02-12: qty 1

## 2023-02-12 MED ORDER — METOPROLOL TARTRATE 5 MG/5ML IV SOLN
INTRAVENOUS | Status: AC
  Filled 2023-02-12: qty 5

## 2023-02-12 MED ORDER — BUPIVACAINE HCL (PF) 0.5 % IJ SOLN
INTRAMUSCULAR | Status: AC
  Filled 2023-02-12: qty 30

## 2023-02-12 SURGICAL SUPPLY — 65 items
APL PRP STRL LF DISP 70% ISPRP (MISCELLANEOUS) ×1
APPLIER CLIP 11 MED OPEN (CLIP)
APPLIER CLIP 13 LRG OPEN (CLIP)
APR CLP LRG 13 20 CLIP (CLIP)
APR CLP MED 11 20 MLT OPN (CLIP)
BARRIER SKIN 2 3/4 (OSTOMY) IMPLANT
BARRIER SKIN OD2.25 2 3/4 FLNG (OSTOMY) IMPLANT
BNDG GAUZE ROLL STR 2.25X3YD (GAUZE/BANDAGES/DRESSINGS) IMPLANT
BNDG GZE SM 3X2.25 6 PLY (GAUZE/BANDAGES/DRESSINGS) ×1
BRR SKN FLT 2.75X2.25 2 PC (OSTOMY)
CHLORAPREP W/TINT 26 (MISCELLANEOUS) ×1 IMPLANT
CLAMP POUCH DRAINAGE QUIET (OSTOMY) IMPLANT
CLIP APPLIE 11 MED OPEN (CLIP) IMPLANT
CLIP APPLIE 13 LRG OPEN (CLIP) IMPLANT
CLOTH BEACON ORANGE TIMEOUT ST (SAFETY) ×1 IMPLANT
COVER LIGHT HANDLE STERIS (MISCELLANEOUS) ×2 IMPLANT
DRAPE WARM FLUID 44X44 (DRAPES) ×1 IMPLANT
DRSG OPSITE POSTOP 4X10 (GAUZE/BANDAGES/DRESSINGS) IMPLANT
ELECT BLADE 6 FLAT ULTRCLN (ELECTRODE) IMPLANT
ELECT REM PT RETURN 9FT ADLT (ELECTROSURGICAL) ×1
ELECTRODE REM PT RTRN 9FT ADLT (ELECTROSURGICAL) ×1 IMPLANT
GLOVE BIO SURGEON STRL SZ 6.5 (GLOVE) ×1 IMPLANT
GLOVE BIOGEL PI IND STRL 6.5 (GLOVE) ×1 IMPLANT
GLOVE BIOGEL PI IND STRL 7.0 (GLOVE) ×2 IMPLANT
GOWN STRL REUS W/TWL LRG LVL3 (GOWN DISPOSABLE) ×3 IMPLANT
HANDLE SUCTION POOLE (INSTRUMENTS) ×1 IMPLANT
INST SET MAJOR GENERAL (KITS) ×1 IMPLANT
KIT REMOVER STAPLE SKIN (MISCELLANEOUS) IMPLANT
KIT TURNOVER KIT A (KITS) ×1 IMPLANT
LIGASURE IMPACT 36 18CM CVD LR (INSTRUMENTS) IMPLANT
MANIFOLD NEPTUNE II (INSTRUMENTS) ×1 IMPLANT
NDL HYPO 18GX1.5 BLUNT FILL (NEEDLE) ×1 IMPLANT
NDL HYPO 21X1.5 SAFETY (NEEDLE) ×1 IMPLANT
NEEDLE HYPO 18GX1.5 BLUNT FILL (NEEDLE) ×1 IMPLANT
NEEDLE HYPO 21X1.5 SAFETY (NEEDLE) ×1 IMPLANT
NS IRRIG 1000ML POUR BTL (IV SOLUTION) ×2 IMPLANT
PACK MAJOR ABDOMINAL (CUSTOM PROCEDURE TRAY) ×1 IMPLANT
PAD ABD 5X9 TENDERSORB (GAUZE/BANDAGES/DRESSINGS) IMPLANT
PAD ARMBOARD 7.5X6 YLW CONV (MISCELLANEOUS) ×1 IMPLANT
PENCIL SMOKE EVACUATOR COATED (MISCELLANEOUS) ×1 IMPLANT
POSITIONER HEAD 8X9X4 ADT (SOFTGOODS) ×1 IMPLANT
POUCH OSTOMY 2 3/4 H 3804 (WOUND CARE)
POUCH OSTOMY 2 PC DRNBL 2.75 (WOUND CARE) IMPLANT
RELOAD LINEAR CUT PROX 55 BLUE (ENDOMECHANICALS) IMPLANT
RELOAD PROXIMATE 75MM BLUE (ENDOMECHANICALS) ×2 IMPLANT
RELOAD STAPLE 55 3.8 BLU REG (ENDOMECHANICALS) IMPLANT
RELOAD STAPLE 75 3.8 BLU REG (ENDOMECHANICALS) IMPLANT
RETRACTOR WND ALEXIS-O 25 LRG (MISCELLANEOUS) IMPLANT
RTRCTR WOUND ALEXIS O 25CM LRG (MISCELLANEOUS) ×1
SET BASIN LINEN APH (SET/KITS/TRAYS/PACK) ×1 IMPLANT
SPONGE T-LAP 18X18 ~~LOC~~+RFID (SPONGE) ×1 IMPLANT
STAPLER GUN LINEAR PROX 60 (STAPLE) IMPLANT
STAPLER PROXIMATE 55 BLUE (STAPLE) IMPLANT
STAPLER PROXIMATE 75MM BLUE (STAPLE) IMPLANT
STAPLER VISISTAT (STAPLE) ×1 IMPLANT
SUCTION POOLE HANDLE (INSTRUMENTS) ×1
SUT CHROMIC 0 SH (SUTURE) IMPLANT
SUT CHROMIC 2 0 SH (SUTURE) IMPLANT
SUT CHROMIC 3 0 SH 27 (SUTURE) IMPLANT
SUT PDS AB CT VIOLET #0 27IN (SUTURE) ×2 IMPLANT
SUT PROLENE 2 0 SH 30 (SUTURE) IMPLANT
SUT SILK 3 0 SH CR/8 (SUTURE) ×1 IMPLANT
SYR 30ML LL (SYRINGE) ×2 IMPLANT
TAPE PAPER 2X10 WHT MICROPORE (GAUZE/BANDAGES/DRESSINGS) IMPLANT
TRAY FOLEY MTR SLVR 16FR STAT (SET/KITS/TRAYS/PACK) ×1 IMPLANT

## 2023-02-12 NOTE — Plan of Care (Signed)

## 2023-02-12 NOTE — Progress Notes (Signed)
I received a call from radiologist, Dr. Karen Kays regarding CT abdomen. Radiologist reports interval development of stranding and ill-defined fluid and some free air with several loops of SB in central pelvis with wall thickening.  I spoke with Dr. Karen Kays at 4:10 PM [place] time.  I addressed with Dr. Chales Abrahams the previous documentation of call me at 1:10 PM was incorrect.  Dr. Chales Abrahams noted that that it was 1:10 PM [place] time.  I updated patient and son and discussed need for surgery consultation and operative intervention. I consulted general surgery and discussed with Dr. Baltazar Apo.  She will consult and see patient.  Catarina Hartshorn, DO

## 2023-02-12 NOTE — Progress Notes (Signed)
[facility]  Family was updated in the room. Night team updated. NPO, NG Will start 5 days post operative antibiotics due to perforation, she is already on zosyn for her PNA, so will ensure she stays on antibiotics through 10/9 Will order dressing orders tomorrow  PRN For pain Minimize oral meds, hold ng suction with oral meds   Algis Greenhouse, MD [facility] [address] [place], [place] [address] [phone] (office)

## 2023-02-12 NOTE — Op Note (Addendum)
[facility] Operative Note  02/12/23  Preoperative Diagnosis: Pneumoperitoneum, free fluid    Postoperative Diagnosis: Pneumoperitoneum, free fluid, small bowel perforation    Procedure(s) Performed: Exploratory laparotomy, small bowel resection primary anastomosis with side to side anastomosis    Surgeon: Leatrice Jewels. Henreitta Leber, MD   Assistants: No qualified resident was available    Anesthesia: General endotracheal   Anesthesiologist: Dr. Leta Jungling, MD    Specimens: Small bowel with perforation, suture marks proximal    Estimated Blood Loss: 20cc    Blood Replacement: None    Complications: None   Wound Class: Dirty infected    Operative Indications: Christine Poole is a 72 yo who has newly diagnosed metastatic cancer that is concerned for lung cancer. She had new onset abdominal pain and tachycardia and CT scan with free air and free fluid. We discussed exploration, bowel resection ostomy and potential need for CVL, arterial lines and ICU care. Discussed potential need for blood and type and screen was obtained.   Findings:  Upon entering the abdomen (organ space), I encountered purulence in the right lower quadrant with an area of perforated small bowel with succus coming from the opening .   Procedure: The patient was taken to the operating room and placed supine. General endotracheal anesthesia was induced. Intravenous antibiotics were administered per protocol.  A nasogastric tube positioned to decompress the stomach. A foley catheter was placed. The abdomen was prepped and draped in the usual sterile fashion.   A midline incision was made and carried down to the fascia. The fascia was entered with care with scissors. A wound protector was placed. The bowel was eviscerated and Upon entering the abdomen (organ space), I encountered purulence in the right lower quadrant with an area of perforated small bowel with succus coming from the opening.  The bowel perforation  was resected in the standard fashion using 75 mm linear cutting stapler in the proximal and distal point of the bowel from the perforation. The mesentery was taken with the Ligasure. A side to side anastomosis was completed after enterotomies were made and a linear cutting 75 mm stapler was placed. The common enterotomy was closed with a TA 60. The staple line was oversewn with Lembert 3-0 Silk suture;  three crotch sutures were placed with 3-0 Silk suture. The mesenteric defect was closed with a 3-0 Chromic gut suture. The bowel was ran in its entirety, no further areas of perforation were noted on the small bowel, cecum, ascending, transverse, descending or sigmoid colon. The bowel perforation was at about 30cm from the terminal ileum. This could have been a meckel's or a perforation from metastatic disease. I noted no other obvious signs of metastatic disease. The small bowel was sent to pathology. Ng was confirmed in the stomach.   The abdomen was irrigated copiously. The team changed gloves. The fascia was closed in the standard fashion using 0 PDS suture.  Marcaine was injected. The skin was approximated in two places creating three smaller openings for packing. The wound was packed with saline dampened kerlix and covered with ABD and paper tape.   Final inspection revealed acceptable hemostasis. All counts were correct at the end of the case. The patient was awakened from anesthesia and extubated without complication.  Her foley and NG were left in place. The patient went to the PACU in stable condition.   Christine Greenhouse, MD [facility] [address] Vella Raring [place], [place] [address] [phone] (office)

## 2023-02-12 NOTE — Anesthesia Postprocedure Evaluation (Signed)
Anesthesia Post Note  Patient: Investment banker, corporate  Procedure(s) Performed: EXPLORATION LAPAROTOMY, SMALL BOWEL RESECTION (Abdomen)  Patient location during evaluation: PACU Anesthesia Type: General Level of consciousness: awake and alert Pain management: pain level controlled Vital Signs Assessment: post-procedure vital signs reviewed and stable Respiratory status: spontaneous breathing, nonlabored ventilation, respiratory function stable and patient connected to nasal cannula oxygen Cardiovascular status: blood pressure returned to baseline and stable Postop Assessment: no apparent nausea or vomiting Anesthetic complications: no   There were no known notable events for this encounter.   Last Vitals:  Vitals:   02/12/23 1930 02/12/23 1945  BP: 131/80 134/86  Pulse: (!) 108 (!) 116  Resp: 15 18  Temp:    SpO2: 92% 92%    Last Pain:  Vitals:   02/12/23 1945  TempSrc:   PainSc: 0-No pain                 Etheleen Valtierra L Tunisha Ruland

## 2023-02-12 NOTE — Progress Notes (Signed)
Pts HR continues to be elevated sustaining in the 130's. MD is already aware. Pt c/o of right lower abd pain. PRN morphine given.

## 2023-02-12 NOTE — Transfer of Care (Signed)
Immediate Anesthesia Transfer of Care Note  Patient: Christine Poole  Procedure(s) Performed: EXPLORATION LAPAROTOMY, SMALL BOWEL RESECTION (Abdomen)  Patient Location: PACU  Anesthesia Type:General  Level of Consciousness: awake  Airway & Oxygen Therapy: Patient connected to face mask oxygen  Post-op Assessment: Report given to RN and Post -op Vital signs reviewed and stable  Post vital signs: stable  Last Vitals:  Vitals Value Taken Time  BP 129/79 02/12/23 1917  Temp    Pulse 108 02/12/23 1922  Resp 17 02/12/23 1922  SpO2 93 % 02/12/23 1922  Vitals shown include unfiled device data.  Last Pain:  Vitals:   02/12/23 1446  TempSrc:   PainSc: Asleep         Complications: No notable events documented.

## 2023-02-12 NOTE — Progress Notes (Signed)
PROGRESS NOTE  Christine Poole XBJ:[id number] DOB: January 04, 1951 DOA: 02/08/2023 PCP: Renaye Rakers, MD  Brief History:  72 y.o. female with medical history significant of arthritis, glaucoma, hypothyroidism, hypertension, hyperlipidemia, and more presents to the ED with a chief complaint of generalized weakness.  Patient reports she started feeling ill about 1 month ago.  The symptoms have waxed and waned.  She reports she is lost about 23 pounds in that 1 month.  Her main complaint is generalized weakness.  She used to be able to ambulate independently.  Recently she has been counter/furniture surfing because she cannot walk without holding onto something.  She reports increased shortness of breath.  The shortness of breath has been constant unlike the other symptoms which have eased off and then come back several times.  She reports she has a cough.  Cough hurts her right shoulder.  She describes it as a sharp pain. Patient complains of early satiety and decreased po intake.  She denies any dysphagia or odynophagia.  She is a former smoker, but quit many years ago. At arrival she had slurred speech that she attributes to her generalized weakness, but code stroke was called.  In ED, pt speech off intermittently, no other significant neuro deficit. Code stroke activated. CT no acute finding. On exam, pt has intermittent stuttering pattern speech, distractable. MR brain was negative.  Neurology, Dr. Jerel Shepherd, saw the patient and felt Pt speech symptoms with stuttering speech more likely functional.    Assessment/Plan:  Sepsis -present on admission -presented with fever and tachycardia -PCT 1.05 -due to post-obstructive pneumonia -broaden abx coverage to zosyn -started IVF -lactic acid 1.5 -10/1 blood cultures neg to date   Obstructive pneumonia -02/08/23 CTA chest--5x7 cm RUL mass extending into R-hilum with complete obstruction of RUL bronchus and compression of SVC;   mass effect into  R-pulm artery suggesting tumor thrombus;  infiltrative mass of R-inferior scapula -start zosyn   Lung massses -CTA chest neg for PE;  results as discussed above -case discussed with med/onc, Dr. Dollene Primrose IR biopsy of scapula unless IR determines there is an easier target -10/3--IR biopsy of scapular area -10/3--discussed with Dr. Tawny Asal will arrange outpt follow next week -10/CT abd/pelvis--extensive LN porta hepatis/peripancreatic/retroperitoneal;  R-adrenal mass; bilateral perirenal space nodules  Sinus tachycardia -personally reviewed EKG--sinus tach, nonspecific T-wave changes -CTA chest  Abd pain -worse in last 24 hours -repeat CT abd    Hyperlipidemia -continue statin   Essential HTN -hold lisinopril/hydrochlorothiazide for now due to soft BP -overall BPs improving and remain controlled -continue home dose diltiazem   Hypokalemia -replete po and IV   Hypothyroidism following RAI therapy -continue synthroid -TSH 0.630   DM2 with hypoglycemia -allow for liberal glycemic control at this point   Hyponatremia -volume depletion, poor solute intake with 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  Denies cp, sob, n/v/d.  Objective: Vitals:   02/12/23 0037 02/12/23 0221 02/12/23 0311 02/12/23 0549  BP:  118/72  137/86  Pulse:  (!) 127 (!) 130 (!) 141  Resp:  20  20  Temp:  99.8 F (37.7 C)  98 F (36.7 C)  TempSrc:  Oral    SpO2: 93% 100% 99% 97%  Weight:      Height:        Intake/Output Summary (Last 24 hours) at 02/12/2023 1055 Last data filed at 02/12/2023 0500 Gross per 24 hour  Intake 590.01 ml  Output --  Net 590.01 ml   Weight change:  Exam:  General:  Pt is  alert, follows commands appropriately, not in acute distress HEENT: No icterus, No thrush, No neck mass, [place]/AT Cardiovascular: RRR, S1/S2, no rubs, no gallops Respiratory: CTA bilaterally, no wheezing, no crackles, no rhonchi Abdomen: Soft/+BS, right sided tender, non distended, no guarding Extremities: No edema, No lymphangitis, No petechiae, No rashes, no synovitis   Data Reviewed: I have personally reviewed following labs and imaging studies Basic Metabolic Panel: Recent Labs  Lab 02/08/23 1632 02/08/23 1658 02/09/23 0505 02/11/23 0438 02/12/23 0409  NA 134* 134* 133* 130* 132*  K 3.3* 3.6 3.9 2.7* 3.0*  CL 98 99 99 95* 98  CO2 17*  --  15* 22 18*  GLUCOSE 109* 104* 69* 122* 106*  BUN 17 15 15  7* 10  CREATININE 0.90 0.60 0.75 0.59 0.56  CALCIUM 8.7*  --  8.4* 8.5* 8.6*  MG  --   --  2.2 1.9 1.9   Liver Function Tests: Recent Labs  Lab 02/08/23 1632 02/09/23 0505  AST 16 13*  ALT 19 15  ALKPHOS 155* 151*  BILITOT 1.0 0.8  PROT 7.3 6.9  ALBUMIN 2.7* 2.6*   Recent Labs  Lab 02/08/23 1632  LIPASE 31   Recent Labs  Lab 02/09/23 0505  AMMONIA 15   Coagulation Profile: Recent Labs  Lab 02/08/23 1632  INR 1.2   CBC: Recent Labs  Lab 02/08/23 1632 02/08/23 1658 02/09/23 0505 02/11/23 0438 02/12/23 0409  WBC 38.8*  --  41.2* 33.9* 39.9*  NEUTROABS 34.6*  --  37.0*  --   --   HGB 11.0* 11.9* 10.0* 9.9* 10.5*  HCT 34.6* 35.0* 33.0* 31.6* 34.3*  MCV 82.6  --  83.5 81.4 82.5  PLT 461*  --  462* 426* 416*   Cardiac Enzymes: No results for input(s): "CKTOTAL", "CKMB", "CKMBINDEX", "TROPONINI" in the last 168 hours. BNP: Invalid input(s): "POCBNP" CBG: Recent Labs  Lab 02/08/23 1652  GLUCAP 99   HbA1C: No results for input(s): "HGBA1C" in the last 72 hours. Urine analysis:    Component Value Date/Time   COLORURINE YELLOW 02/09/2023 0500   APPEARANCEUR CLOUDY (A) 02/09/2023 0500   LABSPEC 1.037 (H) 02/09/2023 0500   PHURINE 5.0 02/09/2023 0500    GLUCOSEU >=500 (A) 02/09/2023 0500   HGBUR MODERATE (A) 02/09/2023 0500   BILIRUBINUR NEGATIVE 02/09/2023 0500   KETONESUR 80 (A) 02/09/2023 0500   PROTEINUR 30 (A) 02/09/2023 0500   NITRITE NEGATIVE 02/09/2023 0500   LEUKOCYTESUR MODERATE (A) 02/09/2023 0500   Sepsis Labs: @LABRCNTIP (procalcitonin:4,lacticidven:4) ) Recent Results (from the past 240 hour(s))  Culture, blood (Routine X 2) w Reflex to ID Panel     Status: None (Preliminary result)   Collection Time: 02/09/23  5:05 AM   Specimen: BLOOD LEFT HAND  Result Value Ref Range Status   Specimen Description BLOOD LEFT HAND  Final   Special Requests   Final    BOTTLES DRAWN AEROBIC AND ANAEROBIC Blood Culture adequate volume   Culture   Final    NO GROWTH 3 DAYS Performed at [facility],  [address] [place], [place] [address]    Report Status PENDING  Incomplete  Culture, blood (Routine X 2) w Reflex to ID Panel     Status: None (Preliminary result)   Collection Time: 02/09/23  5:07 AM   Specimen: BLOOD  Result Value Ref Range Status   Specimen Description BLOOD BLOOD LEFT HAND  Final   Special Requests   Final    BOTTLES DRAWN AEROBIC AND ANAEROBIC Blood Culture adequate volume   Culture   Final    NO GROWTH 3 DAYS Performed at [facility], [address], [place], [place] [address]    Report Status PENDING  Incomplete     Scheduled Meds:  acetaminophen  650 mg Oral Q6H   aspirin EC  81 mg Oral Daily   diltiazem  90 mg Oral BID   ezetimibe  10 mg Oral Daily   feeding supplement  237 mL Oral TID BM   heparin  5,000 Units Subcutaneous Q8H   levothyroxine  25 mcg Oral Q0600   megestrol  400 mg Oral Daily   multivitamin with minerals  1 tablet Oral Daily   polyethylene glycol  17 g Oral Daily   rosuvastatin  40 mg Oral Daily   Continuous Infusions:  0.9 % NaCl with KCl 20 mEq / L 75 mL/hr at 02/11/23 1838   piperacillin-tazobactam (ZOSYN)  IV 3.375 g (02/12/23 0542)    Procedures/Studies: CT BONE  TROCAR/NEEDLE BIOPSY DEEP  Result Date: 02/11/2023 INDICATION: [id number] Lung cancer (HCC) [id number] RIGHT scapular mass. EXAM: CT-GUIDED RIGHT SCAPULAR MASS CORE BIOPSY COMPARISON:  CTA chest, 02/08/2023. MEDICATIONS: None. ANESTHESIA/SEDATION: Moderate (conscious) sedation was employed during this procedure. A total of Versed 2 mg and Fentanyl 150 mcg was administered intravenously. Moderate Sedation Time: 21 minutes. The patient's level of consciousness and vital signs were monitored continuously by radiology nursing throughout the procedure under my direct supervision. CONTRAST:  None. COMPLICATIONS: None immediate. PROCEDURE: RADIATION DOSE REDUCTION: This exam was performed according to the departmental dose-optimization program which includes automated exposure control, adjustment of the mA and/or kV according to patient size and/or use of iterative reconstruction technique. Informed consent was obtained from the patient following an explanation of the procedure, risks, benefits and alternatives. A time out was performed prior to the initiation of the procedure. The patient was positioned prone on the CT table and a limited CT was performed for procedural planning demonstrating lytic RIGHT scapular mass. The procedure was planned. The operative site was prepped and draped in the usual sterile fashion. Appropriate trajectory was confirmed with a 22 gauge spinal needle after the adjacent tissues were anesthetized with 1% Lidocaine with epinephrine. Under intermittent CT guidance, a 17 gauge coaxial needle was advanced into the peripheral aspect of the mass. Appropriate positioning was confirmed and 2 samples were obtained with an 18 gauge core needle biopsy device. The co-axial needle was removed and hemostasis was achieved with manual compression. A limited postprocedural CT was negative for hemorrhage or additional complication. A dressing was placed. The patient tolerated the procedure well without immediate  postprocedural complication. IMPRESSION: Successful CT guided core needle biopsy of RIGHT scapular mass. Roanna Banning, MD [facility] Radiology Electronically Signed   By: Roanna Banning M.D.   On: 02/11/2023 16:32   CT ABDOMEN PELVIS W CONTRAST  Result Date: 02/10/2023 CLINICAL DATA:  Prominent intrathoracic malignancy especially involving the right upper lobe and mediastinum, inpatient assessment for staging workup. * Tracking Code: BO * EXAM: CT  ABDOMEN AND PELVIS WITH CONTRAST TECHNIQUE: Multidetector CT imaging of the abdomen and pelvis was performed using the standard protocol following bolus administration of intravenous contrast. RADIATION DOSE REDUCTION: This exam was performed according to the departmental dose-optimization program which includes automated exposure control, adjustment of the mA and/or kV according to patient size and/or use of iterative reconstruction technique. CONTRAST:  75mL OMNIPAQUE IOHEXOL 300 MG/ML  SOLN COMPARISON:  CT chest 02/08/2023 FINDINGS: Lower chest: Small right pleural effusion. Small pericardial effusion. Right coronary artery atheromatous vascular calcifications and left anterior descending coronary atherosclerosis. Hepatobiliary: Unremarkable Pancreas: Obscuration of the pancreatic head due to adjacent extensive adenopathy. I do not see a definite mass arising from the pancreatic head although the region is indistinct and difficult to characterize. No dorsal pancreatic duct dilatation. Spleen: Unremarkable Adrenals/Urinary Tract: Mixed density right adrenal mass measuring 5.2 by 3.9 cm with some internal calcifications and also internal macroscopic fat. 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 Left adrenal gland unremarkable. Urinary bladder and ureters unremarkable. Soft tissue nodule in the right posterior perirenal space 1.1 by 1.0 cm, possibly a metastatic lesion. Bilateral small nodules in the perirenal space suspicious for metastases occluding a 5 mm posterior left perirenal space nodule on image 35 series 3 and a 1.6 by 1.3 cm lower perirenal space nodule on image 62 series 6. Other small perirenal space nodules are present bilaterally. Small hypodense lesions of the right kidney are probably cysts although technically too small to characterize. No further imaging workup of these lesions is indicated. Suspected periurethral diverticulum on image 89 series 3. Stomach/Bowel: Unremarkable Vascular/Lymphatic: Atherosclerosis is present, including aortoiliac atherosclerotic disease. Porta hepatis/peripancreatic node 2.6 cm in short axis on image 33 series 3. Aortocaval node along the posterior margin of the pancreatic head measures 2.7 cm in short axis on image 38 series 3. Peripancreatic lymph node on image 34 series 3 measures 1.2 cm short axis. Central mesenteric node on image 58 series 3 measures 1.7 cm in short axis. Adjacent central mesenteric lymph node on the same image measures 0.2 cm in short axis. Reproductive: Uterus absent.  Adnexa unremarkable. Other: Subcutaneous nodule along the left upper quadrant anteriorly, 0.5 cm in diameter on image 22 series 3. This is nonspecific. 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 IMPRESSION: 1. Extensive adenopathy in the porta hepatis/peripancreatic region, retroperitoneum, and central mesentery, compatible with metastatic disease. 2. Mixed density right adrenal mass with internal macroscopic fat and some internal calcifications. Although the fat content favors benign adrenal myelolipoma, possibility of a collision lesion from  metastatic disease is not completely excluded, and there is an adjacent abnormal soft tissue density nodule along the medial margin of the adrenal gland. 3. 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 4. Small right pleural effusion. 5. Small pericardial effusion. 6. Coronary atherosclerosis. 7. 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 8. 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 9. Aortic and coronary atherosclerosis. Aortic Atherosclerosis (ICD10-I70.0). Electronically Signed   By: Gaylyn Rong M.D.   On: 02/10/2023 18:48   CT Angio Chest PE W/Cm &/Or Wo Cm  Result Date: 02/08/2023 CLINICAL DATA:  Concern for  pulmonary edema. Right upper lobe pneumonia/mass. EXAM: CT ANGIOGRAPHY CHEST WITH CONTRAST TECHNIQUE: Multidetector CT imaging of the chest was performed using the standard protocol during bolus administration of intravenous contrast. Multiplanar CT image reconstructions and MIPs were obtained to evaluate the vascular anatomy. RADIATION DOSE REDUCTION: This exam was performed according to the departmental dose-optimization program which includes automated exposure control, adjustment of the mA and/or kV according to patient size and/or use of iterative reconstruction technique. CONTRAST:  75mL OMNIPAQUE IOHEXOL 350 MG/ML SOLN COMPARISON:  Chest radiograph dated 02/08/2023. FINDINGS: Evaluation of this exam is limited due to respiratory motion artifact. Cardiovascular: Top-normal cardiac size. Small pericardial effusion. Mild atherosclerotic calcification of the thoracic aorta. No aneurysmal dilatation or dissection. The origins of the great vessels of the aortic arch appear patent. Evaluation of the pulmonary arteries is limited due to respiratory motion. No pulmonary artery embolus identified. Mediastinum/Nodes: Right hilar mass/adenopathy measures approximately 5 x 7 cm. There is a large area of  consolidation in the right upper lobe representing a combination of mass and post obstructive atelectasis or pneumonia. There is extension of the mass into the right apex with compression of the SVC. There is mass effect and compression of the right pulmonary artery with near complete occlusion of the right upper lobe pulmonary artery and high-grade narrowing of the right middle and right lower lobe pulmonary artery branches. Apparent protrusion of the lobulated mass into the right pulmonary artery suggestive of vascular invasion and tumor thrombus. Additional mediastinal adenopathy in the anterior mediastinum and prevascular space. The esophagus is grossly unremarkable. Asymmetric enlargement and heterogeneity of the right thyroid lobe. This has been evaluated on previous imaging. (ref: J Am Coll Radiol. 2015 Feb;12(2): 143-50).No mediastinal fluid collection. Lungs/Pleura: Consolidative changes of the majority of the right upper lobe as above, combination of malignancy and postobstructive atelectasis/infiltrate. There is mass effect and complete compression of the right upper lobe bronchus. Additional lobulated subpleural mass in the right upper lobe measures approximately 2 x 4 cm. The left lung is clear. There is no pleural effusion or pneumothorax. There is mild compression of the right mainstem bronchus. The central airways remain patent. Upper Abdomen: Fatty liver. Indeterminate 3.5 x 5.0 cm right adrenal mass as well as partially visualized 2.7 x 2.4 cm hypodense mass in the uncinate process of the pancreas. Further evaluation of the abdominal masses with MRI without and with contrast on a nonemergent/outpatient basis recommended. Musculoskeletal: There is infiltrative mass involving the inferior right scapula with associated pathologic fracture. Osteopenia with degenerative changes of the spine. Review of the MIP images confirms the above findings. IMPRESSION: 1. No CT evidence of pulmonary embolism. 2.  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 Associated complete obstruction of the right upper lobe bronchus and compression of the SVC and invasion into the right pulmonary artery. 3. Mediastinal adenopathy. 4. Infiltrative mass involving the inferior right scapula with associated pathologic fracture. 5. Indeterminate right adrenal mass and partially visualized hypodense mass in the uncinate process of the pancreas. Further evaluation with MRI without and with contrast on a nonemergent/outpatient basis recommended. 6.  Aortic Atherosclerosis (ICD10-I70.0). Electronically Signed   By: Elgie Collard M.D.   On: 02/08/2023 22:23   MR Brain Wo Contrast (neuro protocol)  Result Date: 02/08/2023 CLINICAL DATA:  Slurred speech, nausea and vomiting EXAM: MRI HEAD WITHOUT CONTRAST MRA HEAD WITHOUT CONTRAST TECHNIQUE: Multiplanar, multi-echo pulse sequences of the brain and surrounding structures were acquired without intravenous contrast. Angiographic images of the  Circle of Willis were acquired using MRA technique without intravenous contrast. COMPARISON:  None Available. FINDINGS: MRI HEAD FINDINGS Brain: No acute infarct, mass effect or extra-axial collection. No chronic microhemorrhage or siderosis. Normal white matter signal, parenchymal volume and CSF spaces. The midline structures are normal. 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 Vascular: Normal flow voids. Skull and upper cervical spine: Normal marrow signal. Sinuses/Orbits: No acute or significant finding. Other: None. MRA HEAD FINDINGS POSTERIOR CIRCULATION: --Vertebral arteries: Normal --Inferior cerebellar arteries: Normal. --Basilar artery: Normal. --Superior cerebellar arteries: Normal. --Posterior cerebral arteries: Normal. ANTERIOR CIRCULATION: --Intracranial internal carotid arteries: Normal. --Anterior cerebral arteries (ACA): Normal. --Middle cerebral arteries (MCA): Normal. Anatomic variants: None IMPRESSION: 1. No acute  intracranial abnormality. 2. 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 3. Normal intracranial MRA. Electronically Signed   By: Deatra Robinson M.D.   On: 02/08/2023 21:45   MR Angio Head WO CONTRAST  Result Date: 02/08/2023 CLINICAL DATA:  Slurred speech, nausea and vomiting EXAM: MRI HEAD WITHOUT CONTRAST MRA HEAD WITHOUT CONTRAST TECHNIQUE: Multiplanar, multi-echo pulse sequences of the brain and surrounding structures were acquired without intravenous contrast. Angiographic images of the Circle of Willis were acquired using MRA technique without intravenous contrast. COMPARISON:  None Available. FINDINGS: MRI HEAD FINDINGS Brain: No acute infarct, mass effect or extra-axial collection. No chronic microhemorrhage or siderosis. Normal white matter signal, parenchymal volume and CSF spaces. The midline structures are normal. 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 Vascular: Normal flow voids. Skull and upper cervical spine: Normal marrow signal. Sinuses/Orbits: No acute or significant finding. Other: None. MRA HEAD FINDINGS POSTERIOR CIRCULATION: --Vertebral arteries: Normal --Inferior cerebellar arteries: Normal. --Basilar artery: Normal. --Superior cerebellar arteries: Normal. --Posterior cerebral arteries: Normal. ANTERIOR CIRCULATION: --Intracranial internal carotid arteries: Normal. --Anterior cerebral arteries (ACA): Normal. --Middle cerebral arteries (MCA): Normal. Anatomic variants: None IMPRESSION: 1. No acute intracranial abnormality. 2. 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 3. Normal intracranial MRA. Electronically Signed   By: Deatra Robinson M.D.   On: 02/08/2023 21:45   DG Chest Port 1 View  Result Date: 02/08/2023 CLINICAL DATA:  Shortness of breath EXAM: PORTABLE CHEST 1 VIEW COMPARISON:  None Available. FINDINGS: Low lung volumes. Borderline cardiomegaly. Dense right upper lobe opacity. No pneumothorax or pleural effusion IMPRESSION: Low lung volumes. Dense right upper lobe opacity,  possible pneumonia though neoplasm could also produce this appearance. Consider correlation with chest CT Electronically Signed   By: Jasmine Pang M.D.   On: 02/08/2023 19:14   DG Shoulder Right  Result Date: 02/08/2023 CLINICAL DATA:  Right shoulder pain for 2 weeks EXAM: RIGHT SHOULDER - 2+ VIEW COMPARISON:  None Available. FINDINGS: Mild AC joint and glenohumeral degenerative change. No fracture or malalignment. Partially visualized right upper lobe opacity IMPRESSION: 1. Mild degenerative change. 2. Partially visualized right upper lobe opacity, recommend chest radiograph. Electronically Signed   By: Jasmine Pang M.D.   On: 02/08/2023 19:13   CT HEAD CODE STROKE WO CONTRAST  Result Date: 02/08/2023 CLINICAL DATA:  Code stroke. Neuro deficit, acute, stroke suspected. EXAM: CT HEAD WITHOUT CONTRAST TECHNIQUE: Contiguous axial images were obtained from the base of the skull through the vertex without intravenous contrast. RADIATION DOSE REDUCTION: This exam was performed according to the departmental dose-optimization program which includes automated exposure control, adjustment of the mA and/or kV according to patient size and/or use of iterative reconstruction technique. COMPARISON:  None. FINDINGS: Brain: Cerebral volume is normal. There is no acute intracranial hemorrhage. No demarcated cortical infarct. No extra-axial fluid collection.  No evidence of an intracranial mass. No midline shift. Vascular: No hyperdense vessel.  Atherosclerotic calcifications. Skull: No calvarial fracture or aggressive osseous lesion. Sinuses/Orbits: No mass or acute finding within the imaged orbits. No significant paranasal sinus disease at the imaged levels. ASPECTS [facility] Stroke Program Early CT Score) - Ganglionic level infarction (caudate, lentiform nuclei, internal capsule, insula, M1-M3 cortex): 7 - Supraganglionic infarction (M4-M6 cortex): 3 Total score (0-10 with 10 being normal): 10 No evidence of an acute  intracranial abnormality. These results were called by telephone at the time of interpretation on 02/08/2023 at 5:07 pm to provider Vanetta Mulders , who verbally acknowledged these results. IMPRESSION: No evidence of an acute intracranial abnormality. Electronically Signed   By: Jackey Loge D.O.   On: 02/08/2023 17:08    Catarina Hartshorn, DO  [facility]  If 7PM-7AM, please contact night-coverage [email or web address] Password [email or web address] 02/12/2023, 10:55 AM   LOS: 4 days

## 2023-02-12 NOTE — Anesthesia Preprocedure Evaluation (Addendum)
Anesthesia Evaluation  Patient identified by MRN, date of birth, ID band Patient awake    Reviewed: Allergy & Precautions, H&P , NPO status , Patient's Chart, lab work & pertinent test results, reviewed documented beta blocker date and time   Airway Mallampati: II  TM Distance: >3 FB Neck ROM: full    Dental no notable dental hx. (+) Dental Advisory Given   Pulmonary pneumonia, former smoker Lung cancer right lung   Pulmonary exam normal breath sounds clear to auscultation       Cardiovascular Exercise Tolerance: Good hypertension, Normal cardiovascular exam Rhythm:regular Rate:Normal     Neuro/Psych glaucoma  negative psych ROS   GI/Hepatic Neg liver ROS,,,Free air in abdomen   Endo/Other  Hypothyroidism    Renal/GU negative Renal ROS  negative genitourinary   Musculoskeletal  (+) Arthritis , Osteoarthritis,    Abdominal   Peds  Hematology negative hematology ROS (+)   Anesthesia Other Findings Severe malnutrition  Reproductive/Obstetrics negative OB ROS                             Anesthesia Physical Anesthesia Plan  ASA: 3 and emergent  Anesthesia Plan: General   Post-op Pain Management: Dilaudid IV   Induction: Intravenous  PONV Risk Score and Plan: Ondansetron, Dexamethasone and Midazolam  Airway Management Planned: Oral ETT  Additional Equipment: None  Intra-op Plan:   Post-operative Plan: Extubation in OR  Informed Consent: I have reviewed the patients History and Physical, chart, labs and discussed the procedure including the risks, benefits and alternatives for the proposed anesthesia with the patient or authorized representative who has indicated his/her understanding and acceptance.     Dental Advisory Given and Dental advisory given  Plan Discussed with: Surgeon  Anesthesia Plan Comments:        Anesthesia Quick Evaluation

## 2023-02-12 NOTE — Consult Note (Signed)
[facility] Consult  Reason for Consult: Free air/ free fluid  Referring Physician: Dr. Arbutus Leas  Chief Complaint   Emesis; Weakness     HPI: Christine Poole is a 72 y.o. female with a newly diagnosed lung cancer/ right lung mass, scapular mass, abdominal adenopathy who has been having weakness and 20 lbs of weight loss prior to admission. She hd SOB and came to the ED and was found to have the lung mass and concern for PNA. She has been on antibiotics and Oncology has been consulted. She had a biopsy of the rigth scapular mass and today noted more abdominal pain and was tachycardic to 140s. Dr. Arbutus Leas obtained a CT a/p and this noted free air and fluid and thickening of the cecum. She is tender on the right side.   She has had a leukocytosis the entire admission from the metastatic cancer +/- the PNA. She did have reported constipation and had miralax last night and says she has been hurting at the miralax.   Past Medical History:  Diagnosis Date   Arthritis    Glaucoma    Hyperplastic polyp of large intestine    Hypertension     Past Surgical History:  Procedure Laterality Date   ABDOMINAL HYSTERECTOMY     COLONOSCOPY N/A 08/08/2012   SFK:CLEXNT polyps and internal hemorrhoids.Status post removal rectal polyps   COLONOSCOPY N/A 04/16/2014   Procedure: COLONOSCOPY;  Surgeon: Corbin Ade, MD;  Location: [facility];  Service: Endoscopy;  Laterality: N/A;  1:30pm   KNEE ARTHROSCOPY Left     Family History  Problem Relation Age of Onset   Diabetes Mother    Hypertension Mother    Hyperlipidemia Mother    Heart attack Mother     Social History   Tobacco Use   Smoking status: Former    Current packs/day: 0.00    Types: Cigarettes   Smokeless tobacco: Never  Vaping Use   Vaping status: Never Used  Substance Use Topics   Alcohol use: No   Drug use: No    Medications: I have reviewed the patient's current medications. Prior to Admission:   Medications Prior to Admission  Medication Sig Dispense Refill Last Dose   aspirin 81 MG tablet Take 81 mg by mouth daily.   Past Month   diltiazem (CARDIZEM) 90 MG tablet Take 90 mg by mouth 2 (two) times daily.   Past Week   etodolac (LODINE) 400 MG tablet Take 400 mg by mouth 2 (two) times daily.   Past Week   ezetimibe (ZETIA) 10 MG tablet Take 10 mg by mouth daily.  6 Past Week   JANUMET XR [phone] MG TB24 Take 1 tablet by mouth daily. 90 tablet 1 Past Week   JARDIANCE 25 MG TABS tablet Take 1 tablet (25 mg total) by mouth daily. 90 tablet 1 Past Week   latanoprost (XALATAN) 0.005 % ophthalmic solution 1 drop at bedtime.   Past Month   levothyroxine (SYNTHROID) 25 MCG tablet Take 1 tablet (25 mcg total) by mouth daily before breakfast. 90 tablet 3 Past Week   lisinopril-hydrochlorothiazide (PRINZIDE,ZESTORETIC) 20-25 MG per tablet Take 1 tablet by mouth daily.   Past Week   Multiple Vitamin (MULTIVITAMIN) tablet Take 1 tablet by mouth daily.   Past Week   PREVIDENT 5000 ENAMEL PROTECT 1.1-5 % GEL Take 1 Application by mouth daily. Use on teeth once a day.   Past Week   rosuvastatin (CRESTOR) 40 MG tablet Take 40  mg by mouth daily.   Past Week   Scheduled:  acetaminophen  650 mg Oral Q6H   aspirin EC  81 mg Oral Daily   Chlorhexidine Gluconate Cloth  6 each Topical Once   diltiazem  90 mg Oral BID   ezetimibe  10 mg Oral Daily   feeding supplement  237 mL Oral TID BM   heparin  5,000 Units Subcutaneous Q8H   levothyroxine  25 mcg Oral Q0600   megestrol  400 mg Oral Daily   multivitamin with minerals  1 tablet Oral Daily   polyethylene glycol  17 g Oral Daily   rosuvastatin  40 mg Oral Daily   Continuous:  0.9 % NaCl with KCl 20 mEq / L Stopped (02/12/23 1415)   [START ON 02/13/2023] cefoTEtan (CEFOTAN) IV     piperacillin-tazobactam (ZOSYN)  IV 12.5 mL/hr at 02/12/23 1607   WVP:XTGGYIRSWNIOE **OR** acetaminophen, ipratropium-albuterol, morphine injection, ondansetron **OR**  ondansetron (ZOFRAN) IV, oxyCODONE, prochlorperazine  No Known Allergies   ROS:  A comprehensive review of systems was negative except for: Respiratory: positive for SOB, lung mass Gastrointestinal: positive for abdominal pain and constipation  Blood pressure 132/73, pulse (!) 135, temperature 98.5 F (36.9 C), temperature source Oral, resp. rate 19, height 5\' 6"  (1.676 m), weight 74.2 kg, SpO2 97%. Physical Exam Vitals reviewed.  HENT:     Head: Normocephalic.     Nose: Nose normal.  Eyes:     Extraocular Movements: Extraocular movements intact.  Cardiovascular:     Rate and Rhythm: Normal rate.  Pulmonary:     Effort: Pulmonary effort is normal.  Abdominal:     General: There is no distension.     Palpations: Abdomen is soft.     Tenderness: There is abdominal tenderness in the right lower quadrant. There is guarding.     Comments: Localized peritonitis in the RLQ with some guarding   Musculoskeletal:        General: Normal range of motion.  Skin:    General: Skin is warm.  Neurological:     General: No focal deficit present.     Mental Status: She is alert and oriented to person, place, and time.  Psychiatric:        Mood and Affect: Mood normal.        Behavior: Behavior normal.        Judgment: Judgment normal.     Results: Results for orders placed or performed during the hospital encounter of 02/08/23 (from the past 48 hour(s))  Basic metabolic panel     Status: Abnormal   Collection Time: 02/11/23  4:38 AM  Result Value Ref Range   Sodium 130 (L) 135 - 145 mmol/L   Potassium 2.7 (LL) 3.5 - 5.1 mmol/L    Comment: DELTA CHECK NOTED CRITICAL RESULT CALLED TO, READ BACK BY AND VERIFIED WITH JOHNSON,B AT 5:25AM ON 02/11/23 BY FESTERMAN,C    Chloride 95 (L) 98 - 111 mmol/L   CO2 22 22 - 32 mmol/L   Glucose, Bld 122 (H) 70 - 99 mg/dL    Comment: Glucose reference range applies only to samples taken after fasting for at least 8 hours.   BUN 7 (L) 8 - 23 mg/dL    Creatinine, Ser 7.03 0.44 - 1.00 mg/dL   Calcium 8.5 (L) 8.9 - 10.3 mg/dL   GFR, Estimated >50 >09 mL/min    Comment: (NOTE) Calculated using the CKD-EPI Creatinine Equation (2021)    Anion gap 13 5 -  15    Comment: Performed at [facility], [address], [place], [place] [address]  Magnesium     Status: None   Collection Time: 02/11/23  4:38 AM  Result Value Ref Range   Magnesium 1.9 1.7 - 2.4 mg/dL    Comment: Performed at [facility], [address], [place], [place] [address]  CBC     Status: Abnormal   Collection Time: 02/11/23  4:38 AM  Result Value Ref Range   WBC 33.9 (H) 4.0 - 10.5 K/uL   RBC 3.88 3.87 - 5.11 MIL/uL   Hemoglobin 9.9 (L) 12.0 - 15.0 g/dL   HCT 09.8 (L) 11.9 - 14.7 %   MCV 81.4 80.0 - 100.0 fL   MCH 25.5 (L) 26.0 - 34.0 pg   MCHC 31.3 30.0 - 36.0 g/dL   RDW 82.9 (H) 56.2 - 13.0 %   Platelets 426 (H) 150 - 400 K/uL   nRBC 0.0 0.0 - 0.2 %    Comment: Performed at [facility], [address], [place], [place] [address]  Basic metabolic panel     Status: Abnormal   Collection Time: 02/12/23  4:09 AM  Result Value Ref Range   Sodium 132 (L) 135 - 145 mmol/L   Potassium 3.0 (L) 3.5 - 5.1 mmol/L   Chloride 98 98 - 111 mmol/L   CO2 18 (L) 22 - 32 mmol/L   Glucose, Bld 106 (H) 70 - 99 mg/dL    Comment: Glucose reference range applies only to samples taken after fasting for at least 8 hours.   BUN 10 8 - 23 mg/dL   Creatinine, Ser 4.69 0.44 - 1.00 mg/dL   Calcium 8.6 (L) 8.9 - 10.3 mg/dL   GFR, Estimated >62 >95 mL/min    Comment: (NOTE) Calculated using the CKD-EPI Creatinine Equation (2021)    Anion gap 16 (H) 5 - 15    Comment: Performed at [facility], [address], [place], [place] [address]  CBC     Status: Abnormal   Collection Time: 02/12/23  4:09 AM  Result Value Ref Range   WBC 39.9 (H) 4.0 - 10.5 K/uL   RBC 4.16 3.87 - 5.11 MIL/uL   Hemoglobin 10.5 (L) 12.0 - 15.0 g/dL   HCT 24.4 (L) 01.0 - 27.2 %   MCV 82.5 80.0 - 100.0 fL   MCH 25.2  (L) 26.0 - 34.0 pg   MCHC 30.6 30.0 - 36.0 g/dL   RDW 53.6 (H) 64.4 - 03.4 %   Platelets 416 (H) 150 - 400 K/uL   nRBC 0.1 0.0 - 0.2 %    Comment: Performed at [facility], [address], [place], [place] [address]  Magnesium     Status: None   Collection Time: 02/12/23  4:09 AM  Result Value Ref Range   Magnesium 1.9 1.7 - 2.4 mg/dL    Comment: Performed at [facility], [address] [place], [place] [address]  D-dimer, quantitative     Status: Abnormal   Collection Time: 02/12/23 10:35 AM  Result Value Ref Range   D-Dimer, Quant 8.49 (H) 0.00 - 0.50 ug/mL-FEU    Comment: (NOTE) At the manufacturer cut-off value of 0.5 g/mL FEU, this assay has a negative predictive value of 95-100%.This assay is intended for use in conjunction with a clinical pretest probability (PTP) assessment model to exclude pulmonary embolism (PE) and deep venous thrombosis (DVT) in outpatients suspected of PE or DVT. Results should be correlated with clinical presentation. Performed  at [facility], [address], [place], [place] [address]    Personally reviewed and showed patient/ family- free air in foci anterior abdomen and around cecum, fluid and thickening of small bowel in pelvis and around cecum  CT ABDOMEN PELVIS WO CONTRAST  Addendum Date: 02/12/2023   ADDENDUM REPORT: 02/12/2023 16:35 ADDENDUM: Clarification, call was made to the ordering service at 1:10 p.m. [place] standard time or 4:10 pm [place] standard time on 02/12/2023 Electronically Signed   By: Karen Kays M.D.   On: 02/12/2023 16:35   Result Date: 02/12/2023 CLINICAL DATA:  Nonlocalized abdominal pain. EXAM: CT ABDOMEN AND PELVIS WITHOUT CONTRAST TECHNIQUE: Multidetector CT imaging of the abdomen and pelvis was performed following the standard protocol without IV contrast. RADIATION DOSE REDUCTION: This exam was performed according to the departmental dose-optimization program which includes automated exposure control, adjustment of the  mA and/or kV according to patient size and/or use of iterative reconstruction technique. COMPARISON:  Separate CT angiogram chest same day 02/12/2023. Abdomen pelvis CT with contrast 2 days ago 02/10/2023 FINDINGS: Lower chest: Please see separate chest CT from same day. Hepatobiliary: On this non IV contrast exam, grossly the liver is preserved. Mildly distended gallbladder with some high density material in this location, possible vicarious excretion of previous contrast versus sludge or stones. Pancreas: Unremarkable. No pancreatic ductal dilatation or surrounding inflammatory changes. Spleen: Normal in size without focal abnormality. Adrenals/Urinary Tract: Left adrenal gland is preserved. 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 With a heterogeneous appearance however in the history of known malignancy a collision lesion is not excluded. Recommend continued follow up or workup. No abnormal calcifications are seen within either kidney nor along the course of either ureter. Duplicated appearance of the left kidney. Nonspecific perinephric stranding there is also some nodularity in the perinephric spaces as described previously example on the left caudal and lateral to the kidney measures up to 18 mm. Focus on the right posteriorly measures 11 mm. Preserved contours of the urinary bladder. Stomach/Bowel: Stomach is nondilated. Small bowel has several fluid-filled nondilated loops in the abdomen and central pelvis. Loops in the pelvis have wall thickening and some stranding which is new from previous. There are some bubbles of free air identified in the abdomen. Normal appendix. Large bowel has a normal course and caliber. Slight wall thickening in the area of the cecum. This new from previous as well Vascular/Lymphatic: Diffuse vascular calcifications. Normal caliber aorta and IVC. Several abnormal lymph nodes are identified there is seen  on the contrast examination. Examples again seen anterior to the IVC, central mesentery. Reproductive: Status post hysterectomy. No adnexal masses. Other: Scattered areas of free air identified. Trace free fluid in the pelvis. Anasarca. Mesenteric stranding. Scattered peritoneal and retroperitoneal nodules identified. Musculoskeletal: Degenerative changes identified along the spine and pelvis. Areas of disc bulging along the lumbar spine with stenosis particularly at L4-5. IMPRESSION: Interval development of stranding, ill-defined fluid and some free air. There are several loops of small bowel in the central pelvis which has some wall thickening. There is also some thickening along the cecum. Adjacent fluid and stranding. Please correlate for any intervention otherwise there is a differential including possibility of bowel perforation. There is seen on the prior contrast examination there is presence of abnormal lymph nodes, peritoneal and mesenteric nodules worrisome for neoplasm. Right adrenal mass again identified which very well could be a myelolipoma although with the heterogeneity of the  lesion in the other findings a collision tumor is possible. Further workup when appropriate. Please see separate dictation of CT angiogram of the chest from same day Critical Value/emergent results were called by telephone at the time of interpretation on 02/12/2023 at 1:10 pm to provider DAVID TAT , who verbally acknowledged these results. Electronically Signed: By: Karen Kays M.D. On: 02/12/2023 16:14   CT Angio Chest Pulmonary Embolism (PE) W or WO Contrast  Result Date: 02/12/2023 CLINICAL DATA:  Chest pain, shortness of breath and elevated D-dimer. Metastatic lung cancer. EXAM: CT ANGIOGRAPHY CHEST WITH CONTRAST TECHNIQUE: Multidetector CT imaging of the chest was performed using the standard protocol during bolus administration of intravenous contrast. Multiplanar CT image reconstructions and MIPs were obtained to  evaluate the vascular anatomy. RADIATION DOSE REDUCTION: This exam was performed according to the departmental dose-optimization program which includes automated exposure control, adjustment of the mA and/or kV according to patient size and/or use of iterative reconstruction technique. CONTRAST:  75mL OMNIPAQUE IOHEXOL 350 MG/ML SOLN COMPARISON:  02/08/2023.  Abdomen and pelvis CT dated 02/10/2023. FINDINGS: Cardiovascular: A large right hilar/right upper lobe mass is again occluding the right upper lobe pulmonary artery and causing stenosis of the proximal right middle lobe and right lower lobe pulmonary arteries. This is mildly progressive compression of the superior vena cava with marked narrowing inferiorly. No filling defects in the opacified pulmonary arteries. Mild atheromatous aortic calcifications. The heart remains mildly enlarged with a small pericardial effusion measuring 8 mm in thickness. Mediastinum/Nodes: The right lobe of the thyroid gland remains diffusely enlarged and heterogeneous suggesting the presence of a 3.1 cm mass. Proximally 50% stenosis of the proximal right mainstem bronchus by the large right hilar/upper lobe mass invading the mediastinum. This is also causing mild narrowing of the proximal left mainstem bronchus and narrowing of the proximal right middle lobe and right lower lobe bronchi. Unremarkable esophagus. Multiple enlarged mediastinal nodes and masses without significant change. Lungs/Pleura: The large right hilar/upper lobe mass continues to cause compression and complete obstruction of the right upper lobe bronchus with associated postobstructive airspace opacity. The airspace opacity has become somewhat more confluence posteriorly on the right. Stable right hilar/upper lobe mass and right upper lobe subpleural masses. Interval small right pleural effusion. Upper Abdomen: Stable large low-density and partially calcified right adrenal mass. Stable partially included small  simple appearing left renal cyst. The renal cyst does not need imaging follow-up. Musculoskeletal: Increased size of the right scapular mass with increased bone destruction. This mass is poorly defined and measures approximately 10.1 x 5.7 cm on image number 29/4, previously approximately 5.7 x 3.9 cm. Review of the MIP images confirms the above findings. IMPRESSION: 1. No pulmonary emboli. 2. Mildly progressive compression of the superior vena cava by the large right hilar/right upper lobe mass. 3. Stable large right hilar/right upper lobe mass with associated occlusion of the right upper lobe bronchus and narrowing of the proximal right middle lobe and right lower lobe bronchi with associated progressive postobstructive airspace opacity. This also continues to cause occlusion of the right upper lobe pulmonary artery and narrowing of the proximal right middle lobe and right lower lobe pulmonary arteries. 4. Stable mediastinal adenopathy and masses. 5. Interval small right pleural effusion. 6. Increased size of the right scapular mass with increased bone destruction. 7. Stable indeterminate large right adrenal mass. 8. Stable mild cardiomegaly with a small pericardial effusion. 9. Aortic atherosclerosis. Aortic Atherosclerosis (ICD10-I70.0). Electronically Signed   By: Zada Finders.D.  On: 02/12/2023 14:07   CT BONE TROCAR/NEEDLE BIOPSY DEEP  Result Date: 02/11/2023 INDICATION: [id number] Lung cancer (HCC) [id number] RIGHT scapular mass. EXAM: CT-GUIDED RIGHT SCAPULAR MASS CORE BIOPSY COMPARISON:  CTA chest, 02/08/2023. MEDICATIONS: None. ANESTHESIA/SEDATION: Moderate (conscious) sedation was employed during this procedure. A total of Versed 2 mg and Fentanyl 150 mcg was administered intravenously. Moderate Sedation Time: 21 minutes. The patient's level of consciousness and vital signs were monitored continuously by radiology nursing throughout the procedure under my direct supervision. CONTRAST:  None.  COMPLICATIONS: None immediate. PROCEDURE: RADIATION DOSE REDUCTION: This exam was performed according to the departmental dose-optimization program which includes automated exposure control, adjustment of the mA and/or kV according to patient size and/or use of iterative reconstruction technique. Informed consent was obtained from the patient following an explanation of the procedure, risks, benefits and alternatives. A time out was performed prior to the initiation of the procedure. The patient was positioned prone on the CT table and a limited CT was performed for procedural planning demonstrating lytic RIGHT scapular mass. The procedure was planned. The operative site was prepped and draped in the usual sterile fashion. Appropriate trajectory was confirmed with a 22 gauge spinal needle after the adjacent tissues were anesthetized with 1% Lidocaine with epinephrine. Under intermittent CT guidance, a 17 gauge coaxial needle was advanced into the peripheral aspect of the mass. Appropriate positioning was confirmed and 2 samples were obtained with an 18 gauge core needle biopsy device. The co-axial needle was removed and hemostasis was achieved with manual compression. A limited postprocedural CT was negative for hemorrhage or additional complication. A dressing was placed. The patient tolerated the procedure well without immediate postprocedural complication. IMPRESSION: Successful CT guided core needle biopsy of RIGHT scapular mass. Roanna Banning, MD [facility] Radiology Electronically Signed   By: Roanna Banning M.D.   On: 02/11/2023 16:32     Assessment & Plan:  BLAIKE NEWBURN is a 72 y.o. female with free air and free fluid of unknown etiology. Patient with newly diagnosed metastatic lung cancer unknown type. She had new onset pain and tachycardia. Discussed exploration and possible bowel resection colostomy, ileostomy, possible central line or arterial  line, risk of bleeding, infection, ostomy, injury to vessels with lines or pneumothorax, discussed reasons for blood and blood consent/ allergic reactions and infections. Discussed post operative course, possibility of intubation and needing further intubation, ICU care, likelihood of wound packing and some delay in her treatment to heal from this perforation.    All questions were answered to the satisfaction of the patient and family in room and son on the phone. Consents obtained Type and screen stat   Lucretia Roers 02/12/2023, 4:51 PM

## 2023-02-13 DIAGNOSIS — K631 Perforation of intestine (nontraumatic): Secondary | ICD-10-CM

## 2023-02-13 DIAGNOSIS — J181 Lobar pneumonia, unspecified organism: Secondary | ICD-10-CM | POA: Diagnosis not present

## 2023-02-13 DIAGNOSIS — J189 Pneumonia, unspecified organism: Secondary | ICD-10-CM | POA: Diagnosis not present

## 2023-02-13 LAB — CBC WITH DIFFERENTIAL/PLATELET
Abs Immature Granulocytes: 0 10*3/uL (ref 0.00–0.07)
Band Neutrophils: 1 %
Basophils Absolute: 0 10*3/uL (ref 0.0–0.1)
Basophils Relative: 0 %
Eosinophils Absolute: 0 10*3/uL (ref 0.0–0.5)
Eosinophils Relative: 0 %
HCT: 29.8 % — ABNORMAL LOW (ref 36.0–46.0)
Hemoglobin: 9.3 g/dL — ABNORMAL LOW (ref 12.0–15.0)
Lymphocytes Relative: 2 %
Lymphs Abs: 0.9 10*3/uL (ref 0.7–4.0)
MCH: 25.9 pg — ABNORMAL LOW (ref 26.0–34.0)
MCHC: 31.2 g/dL (ref 30.0–36.0)
MCV: 83 fL (ref 80.0–100.0)
Monocytes Absolute: 1.8 10*3/uL — ABNORMAL HIGH (ref 0.1–1.0)
Monocytes Relative: 4 %
Neutro Abs: 42 10*3/uL — ABNORMAL HIGH (ref 1.7–7.7)
Neutrophils Relative %: 93 %
Platelets: 373 10*3/uL (ref 150–400)
RBC: 3.59 MIL/uL — ABNORMAL LOW (ref 3.87–5.11)
RDW: 17.2 % — ABNORMAL HIGH (ref 11.5–15.5)
WBC: 44.7 10*3/uL — ABNORMAL HIGH (ref 4.0–10.5)
nRBC: 0 % (ref 0.0–0.2)

## 2023-02-13 LAB — BASIC METABOLIC PANEL
Anion gap: 10 (ref 5–15)
BUN: 11 mg/dL (ref 8–23)
CO2: 21 mmol/L — ABNORMAL LOW (ref 22–32)
Calcium: 8.4 mg/dL — ABNORMAL LOW (ref 8.9–10.3)
Chloride: 103 mmol/L (ref 98–111)
Creatinine, Ser: 0.64 mg/dL (ref 0.44–1.00)
GFR, Estimated: >60 mL/min (ref >60)
Glucose, Bld: 150 mg/dL — ABNORMAL HIGH (ref 70–99)
Potassium: 3.5 mmol/L (ref 3.5–5.1)
Sodium: 134 mmol/L — ABNORMAL LOW (ref 135–145)

## 2023-02-13 LAB — MAGNESIUM: Magnesium: 1.9 mg/dL (ref 1.7–2.4)

## 2023-02-13 MED ORDER — FLUCONAZOLE IN SODIUM CHLORIDE 400-0.9 MG/200ML-% IV SOLN
400.0000 mg | INTRAVENOUS | Status: AC
  Administered 2023-02-13 (×2): 400 mg via INTRAVENOUS
  Filled 2023-02-13 (×2): qty 200

## 2023-02-13 MED ORDER — CHLORHEXIDINE GLUCONATE CLOTH 2 % EX PADS
6.0000 | MEDICATED_PAD | Freq: Every day | CUTANEOUS | Status: DC
  Administered 2023-02-13 – 2023-02-18 (×6): 6 via TOPICAL

## 2023-02-13 MED ORDER — FLUCONAZOLE IN SODIUM CHLORIDE 400-0.9 MG/200ML-% IV SOLN
400.0000 mg | INTRAVENOUS | Status: DC
  Administered 2023-02-14 – 2023-02-18 (×5): 400 mg via INTRAVENOUS
  Filled 2023-02-13 (×7): qty 200

## 2023-02-13 MED ORDER — OXYCODONE HCL 5 MG PO TABS
5.0000 mg | ORAL_TABLET | ORAL | Status: DC | PRN
  Administered 2023-02-15 – 2023-02-16 (×3): 5 mg
  Filled 2023-02-13 (×4): qty 1

## 2023-02-13 MED ORDER — METOPROLOL TARTRATE 5 MG/5ML IV SOLN
2.5000 mg | Freq: Four times a day (QID) | INTRAVENOUS | Status: DC
  Administered 2023-02-13 – 2023-02-18 (×20): 2.5 mg via INTRAVENOUS
  Filled 2023-02-13 (×21): qty 5

## 2023-02-13 MED ORDER — LEVOTHYROXINE SODIUM 25 MCG PO TABS
25.0000 ug | ORAL_TABLET | Freq: Every day | ORAL | Status: DC
  Administered 2023-02-14 – 2023-02-18 (×5): 25 ug
  Filled 2023-02-13 (×5): qty 1

## 2023-02-13 MED ORDER — ACETAMINOPHEN 325 MG PO TABS: 650.0000 mg | ORAL_TABLET | Freq: Four times a day (QID) | ORAL | Status: DC | PRN

## 2023-02-13 MED ORDER — ACETAMINOPHEN 650 MG RE SUPP: 650.0000 mg | Freq: Four times a day (QID) | RECTAL | Status: DC | PRN

## 2023-02-13 MED ORDER — DILTIAZEM HCL 60 MG PO TABS: 90.0000 mg | ORAL_TABLET | Freq: Two times a day (BID) | ORAL | Status: DC

## 2023-02-13 MED ORDER — ONDANSETRON HCL 4 MG PO TABS: 4.0000 mg | ORAL_TABLET | Freq: Four times a day (QID) | ORAL | Status: DC | PRN

## 2023-02-13 MED ORDER — ONDANSETRON HCL 4 MG/2ML IJ SOLN
4.0000 mg | Freq: Four times a day (QID) | INTRAMUSCULAR | Status: DC | PRN
  Administered 2023-02-16: 4 mg via INTRAVENOUS
  Filled 2023-02-13: qty 2

## 2023-02-13 NOTE — Progress Notes (Signed)
Pt sat up in chair for almost 2 hours and tolerated well. Pt assisted up to stand and was able to ambulate from chair to doorway of room and back to bed. Pt did well, steady on feet with one assist. Pt assisted back to bed with assist. Pt was medicated prior to moving for abd pain rated 7/10. NGT intact with small amount of light green colored drainage. DDI to abd, foley cath intact draining yellow urine. Pt no longer on O2, SaO2 has been 95-100% on room air while up in chair and with ambulation. HR now in 110-119 range, no chest pain. Pt completes IS to x4 with good effort.

## 2023-02-13 NOTE — Progress Notes (Signed)
PROGRESS NOTE  Christine Poole FAO:[id number] DOB: October 24, 1950 DOA: 02/08/2023 PCP: Renaye Rakers, MD  Brief History:  72 y.o. female with medical history significant of arthritis, glaucoma, hypothyroidism, hypertension, hyperlipidemia, and more presents to the ED with a chief complaint of generalized weakness.  Patient reports she started feeling ill about 1 month ago.  The symptoms have waxed and waned.  She reports she is lost about 23 pounds in that 1 month.  Her main complaint is generalized weakness.  She used to be able to ambulate independently.  Recently she has been counter/furniture surfing because she cannot walk without holding onto something.  She reports increased shortness of breath.  The shortness of breath has been constant unlike the other symptoms which have eased off and then come back several times.  She reports she has a cough.  Cough hurts her right shoulder.  She describes it as a sharp pain. Patient complains of early satiety and decreased po intake.  She denies any dysphagia or odynophagia.  She is a former smoker, but quit many years ago. At arrival she had slurred speech that she attributes to her generalized weakness, but code stroke was called.  In ED, pt speech off intermittently, no other significant neuro deficit. Code stroke activated. CT no acute finding. On exam, pt has intermittent stuttering pattern speech, distractable. MR brain was negative.  Neurology, Dr. Jerel Shepherd, saw the patient and felt Pt speech symptoms with stuttering speech more likely functional.   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  There is some compression of the SVC.  There is also an infiltrative mass involving the right inferior scapula. CT of the abdomen and pelvis showed an adrenal mass on the right with extensive  lymphadenopathy of the porta hepatis, peripancreatic areas, and retroperitoneum.  The patient underwent percutaneous biopsy of her right scapula on 02/11/2023. Her hospital stay was prolonged secondary to development of abdominal pain on 02/12/2023 for which CT of the abdomen and pelvis revealed free air. The patient was taken to the operating room for exploratory laparotomy.  She was found to have small bowel perforation.  She underwent small bowel resection and primary anastomosis with Dr. Algis Greenhouse.   Assessment/Plan: Sepsis -present on admission -presented with fever and tachycardia -PCT 1.05 -due to post-obstructive pneumonia -broaden abx coverage to zosyn -started IVF -lactic acid 1.5 -10/1 blood cultures neg to date   Obstructive pneumonia -02/08/23 CTA chest--5x7 cm RUL mass extending into R-hilum with complete obstruction of RUL bronchus and compression of SVC;   mass effect into R-pulm artery suggesting tumor thrombus;  infiltrative mass of R-inferior scapula -start zosyn  Small bowel perforation -02/12/2023 CT abdomen noted pneumoperitoneum -02/12/2023--exploratory laparotomy with small bowel resection and primary anastomosis -Continue Zosyn -Postoperative care per Dr. Henreitta Leber -Add fluconazole   Lung massses -CTA chest neg for PE;  results as discussed above -case discussed with med/onc, Dr. Dollene Primrose IR biopsy of scapula unless IR determines there is an easier target -10/3--IR biopsy of scapular area -10/3--discussed with Dr. Tawny Asal will arrange outpt follow next week -10/2 CT abd/pelvis--extensive LN porta hepatis/peripancreatic/retroperitoneal;  R-adrenal mass; bilateral perirenal space nodules   Sinus tachycardia -personally reviewed EKG--sinus tach, nonspecific T-wave changes -CTA chest--negative PE -As the patient is n.p.o., switch diltiazem to IV Lopressor     Hyperlipidemia -Holding  statin until able to take p.o.   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  Abd pain is controlled   Objective: Vitals:   02/13/23 0132 02/13/23 0532 02/13/23 0832 02/13/23 1156  BP: 120/83 124/84 131/88 124/86  Pulse:  (!) 126 (!) 128 (!) 124  Resp: 20 20 20    Temp: 98.6 F (37 C) 98.5 F (36.9 C) 98.3 F (36.8 C)   TempSrc: Oral Oral Oral   SpO2: 99% 100% 100%   Weight:      Height:        Intake/Output Summary (Last 24 hours) at 02/13/2023 1353 Last data filed at 02/13/2023 0942 Gross per 24 hour  Intake 2865.36 ml  Output 720 ml  Net 2145.36 ml   Weight change:  Exam:  General:  Pt is alert, follows commands appropriately, not in acute distress HEENT: No icterus, No thrush, No neck mass, /AT Cardiovascular: RRR, S1/S2, no rubs, no gallops Respiratory:bibasilar rales.  No wheeze Abdomen: Soft/+BS, mild diffuse tender, non distended, no guarding Extremities: No edema, No lymphangitis, No petechiae, No rashes, no synovitis   Data Reviewed: I have personally reviewed following labs and imaging studies Basic Metabolic Panel: Recent Labs  Lab 02/08/23 1632 02/08/23 1658 02/09/23 0505 02/11/23 0438 02/12/23 0409 02/13/23 0621  NA 134* 134* 133* 130* 132* 134*  K 3.3* 3.6 3.9 2.7* 3.0* 3.5  CL 98  99 99 95* 98 103  CO2 17*  --  15* 22 18* 21*  GLUCOSE 109* 104* 69* 122* 106* 150*  BUN 17 15 15  7* 10 11  CREATININE 0.90 0.60 0.75 0.59 0.56 0.64  CALCIUM 8.7*  --  8.4* 8.5* 8.6* 8.4*  MG  --   --  2.2 1.9 1.9 1.9   Liver Function Tests: Recent Labs  Lab 02/08/23 1632 02/09/23 0505  AST 16 13*  ALT 19 15  ALKPHOS 155* 151*  BILITOT 1.0 0.8  PROT 7.3 6.9  ALBUMIN 2.7* 2.6*   Recent Labs  Lab 02/08/23 1632  LIPASE 31   Recent Labs  Lab 02/09/23 0505  AMMONIA 15   Coagulation Profile: Recent Labs  Lab 02/08/23 1632  INR 1.2   CBC: Recent Labs  Lab 02/08/23 1632 02/08/23 1658 02/09/23 0505 02/11/23 0438 02/12/23 0409 02/13/23 0621  WBC 38.8*  --  41.2* 33.9* 39.9* 44.7*  NEUTROABS 34.6*  --  37.0*  --   --  42.0*  HGB 11.0* 11.9* 10.0* 9.9* 10.5* 9.3*  HCT 34.6* 35.0* 33.0* 31.6* 34.3* 29.8*  MCV 82.6  --  83.5 81.4 82.5 83.0  PLT 461*  --  462* 426* 416* 373   Cardiac Enzymes: No results for input(s): "CKTOTAL", "CKMB", "CKMBINDEX", "TROPONINI" in the  last 168 hours. BNP: Invalid input(s): "POCBNP" CBG: Recent Labs  Lab 02/08/23 1652  GLUCAP 99   HbA1C: No results for input(s): "HGBA1C" in the last 72 hours. Urine analysis:    Component Value Date/Time   COLORURINE YELLOW 02/09/2023 0500   APPEARANCEUR CLOUDY (A) 02/09/2023 0500   LABSPEC 1.037 (H) 02/09/2023 0500   PHURINE 5.0 02/09/2023 0500   GLUCOSEU >=500 (A) 02/09/2023 0500   HGBUR MODERATE (A) 02/09/2023 0500   BILIRUBINUR NEGATIVE 02/09/2023 0500   KETONESUR 80 (A) 02/09/2023 0500   PROTEINUR 30 (A) 02/09/2023 0500   NITRITE NEGATIVE 02/09/2023 0500   LEUKOCYTESUR MODERATE (A) 02/09/2023 0500   Sepsis Labs: @LABRCNTIP (procalcitonin:4,lacticidven:4) ) Recent Results (from the past 240 hour(s))  Culture, blood (Routine X 2) w Reflex to ID Panel     Status: None (Preliminary result)   Collection Time: 02/09/23  5:05 AM   Specimen: BLOOD LEFT HAND  Result Value Ref Range  Status   Specimen Description BLOOD LEFT HAND  Final   Special Requests   Final    BOTTLES DRAWN AEROBIC AND ANAEROBIC Blood Culture adequate volume   Culture   Final    NO GROWTH 4 DAYS Performed at [facility], [address] [place], [place] [address]    Report Status PENDING  Incomplete  Culture, blood (Routine X 2) w Reflex to ID Panel     Status: None (Preliminary result)   Collection Time: 02/09/23  5:07 AM   Specimen: BLOOD  Result Value Ref Range Status   Specimen Description BLOOD BLOOD LEFT HAND  Final   Special Requests   Final    BOTTLES DRAWN AEROBIC AND ANAEROBIC Blood Culture adequate volume   Culture   Final    NO GROWTH 4 DAYS Performed at [facility], [address], [place], [place] [address]    Report Status PENDING  Incomplete  Aerobic/Anaerobic Culture w Gram Stain (surgical/deep wound)     Status: None (Preliminary result)   Collection Time: 02/12/23  6:25 PM   Specimen: Path fluid; Body Fluid  Result Value Ref Range Status   Specimen Description   Final    FLUID Performed at [facility], [address] [place], [place] [address]    Special Requests   Final    NONE Performed at [facility], [address] [place], [place] [address]    Gram Stain   Final    MODERATE WBC PRESENT, PREDOMINANTLY PMN RARE BUDDING YEAST SEEN RARE GRAM POSITIVE RODS    Culture   Final    NO GROWTH < 12 HOURS Performed at [facility], [address] [place], [place] [address]    Report Status PENDING  Incomplete     Scheduled Meds:  Chlorhexidine Gluconate Cloth  6 each Topical Daily   heparin  5,000 Units Subcutaneous Q8H   [START ON 02/14/2023] levothyroxine  25 mcg Per Tube Q0600   metoprolol tartrate  2.5 mg Intravenous Q6H   Continuous Infusions:  fluconazole (DIFLUCAN) IV 400 mg (02/13/23 1337)   [START ON 02/14/2023] fluconazole (DIFLUCAN) IV     lactated ringers 100 mL/hr at 02/12/23 2212   piperacillin-tazobactam (ZOSYN)  IV 3.375 g (02/13/23  5643)    Procedures/Studies: DG Chest Port 1 View  Result Date: 02/12/2023 CLINICAL DATA:  Nasogastric tube placement. EXAM: PORTABLE CHEST 1 VIEW COMPARISON:  CT earlier today.  Chest radiograph 02/08/2023 FINDINGS: Tip and side port of the enteric tube below the diaphragm in the stomach. Dense right  upper lobe opacity as characterized on CT earlier today. Slight gaseous colonic distention in the upper abdomen. IMPRESSION: Tip and side port of the enteric tube below the diaphragm in the stomach. Electronically Signed   By: Narda Rutherford M.D.   On: 02/12/2023 22:22   CT ABDOMEN PELVIS WO CONTRAST  Addendum Date: 02/12/2023   ADDENDUM REPORT: 02/12/2023 16:35 ADDENDUM: Clarification, call was made to the ordering service at 1:10 p.m. [place] standard time or 4:10 pm [place] standard time on 02/12/2023 Electronically Signed   By: Karen Kays M.D.   On: 02/12/2023 16:35   Result Date: 02/12/2023 CLINICAL DATA:  Nonlocalized abdominal pain. EXAM: CT ABDOMEN AND PELVIS WITHOUT CONTRAST TECHNIQUE: Multidetector CT imaging of the abdomen and pelvis was performed following the standard protocol without IV contrast. RADIATION DOSE REDUCTION: This exam was performed according to the departmental dose-optimization program which includes automated exposure control, adjustment of the mA and/or kV according to patient size and/or use of iterative reconstruction technique. COMPARISON:  Separate CT angiogram chest same day 02/12/2023. Abdomen pelvis CT with contrast 2 days ago 02/10/2023 FINDINGS: Lower chest: Please see separate chest CT from same day. Hepatobiliary: On this non IV contrast exam, grossly the liver is preserved. Mildly distended gallbladder with some high density material in this location, possible vicarious excretion of previous contrast versus sludge or stones. Pancreas: Unremarkable. No pancreatic ductal dilatation or surrounding inflammatory changes. Spleen: Normal in size without focal  abnormality. Adrenals/Urinary Tract: Left adrenal gland is preserved. 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 With a heterogeneous appearance however in the history of known malignancy a collision lesion is not excluded. Recommend continued follow up or workup. No abnormal calcifications are seen within either kidney nor along the course of either ureter. Duplicated appearance of the left kidney. Nonspecific perinephric stranding there is also some nodularity in the perinephric spaces as described previously example on the left caudal and lateral to the kidney measures up to 18 mm. Focus on the right posteriorly measures 11 mm. Preserved contours of the urinary bladder. Stomach/Bowel: Stomach is nondilated. Small bowel has several fluid-filled nondilated loops in the abdomen and central pelvis. Loops in the pelvis have wall thickening and some stranding which is new from previous. There are some bubbles of free air identified in the abdomen. Normal appendix. Large bowel has a normal course and caliber. Slight wall thickening in the area of the cecum. This new from previous as well Vascular/Lymphatic: Diffuse vascular calcifications. Normal caliber aorta and IVC. Several abnormal lymph nodes are identified there is seen on the contrast examination. Examples again seen anterior to the IVC, central mesentery. Reproductive: Status post hysterectomy. No adnexal masses. Other: Scattered areas of free air identified. Trace free fluid in the pelvis. Anasarca. Mesenteric stranding. Scattered peritoneal and retroperitoneal nodules identified. Musculoskeletal: Degenerative changes identified along the spine and pelvis. Areas of disc bulging along the lumbar spine with stenosis particularly at L4-5. IMPRESSION: Interval development of stranding, ill-defined fluid and some free air. There are several loops of small bowel in the central pelvis which has  some wall thickening. There is also some thickening along the cecum. Adjacent fluid and stranding. Please correlate for any intervention otherwise there is a differential including possibility of bowel perforation. There is seen on the prior contrast examination there is presence of abnormal lymph nodes, peritoneal and mesenteric nodules worrisome for neoplasm. Right adrenal mass again identified which very well could be  a myelolipoma although with the heterogeneity of the lesion in the other findings a collision tumor is possible. Further workup when appropriate. Please see separate dictation of CT angiogram of the chest from same day Critical Value/emergent results were called by telephone at the time of interpretation on 02/12/2023 at 1:10 pm to provider Heriberto Stmartin , who verbally acknowledged these results. Electronically Signed: By: Karen Kays M.D. On: 02/12/2023 16:14   CT Angio Chest Pulmonary Embolism (PE) W or WO Contrast  Result Date: 02/12/2023 CLINICAL DATA:  Chest pain, shortness of breath and elevated D-dimer. Metastatic lung cancer. EXAM: CT ANGIOGRAPHY CHEST WITH CONTRAST TECHNIQUE: Multidetector CT imaging of the chest was performed using the standard protocol during bolus administration of intravenous contrast. Multiplanar CT image reconstructions and MIPs were obtained to evaluate the vascular anatomy. RADIATION DOSE REDUCTION: This exam was performed according to the departmental dose-optimization program which includes automated exposure control, adjustment of the mA and/or kV according to patient size and/or use of iterative reconstruction technique. CONTRAST:  75mL OMNIPAQUE IOHEXOL 350 MG/ML SOLN COMPARISON:  02/08/2023.  Abdomen and pelvis CT dated 02/10/2023. FINDINGS: Cardiovascular: A large right hilar/right upper lobe mass is again occluding the right upper lobe pulmonary artery and causing stenosis of the proximal right middle lobe and right lower lobe pulmonary arteries. This is  mildly progressive compression of the superior vena cava with marked narrowing inferiorly. No filling defects in the opacified pulmonary arteries. Mild atheromatous aortic calcifications. The heart remains mildly enlarged with a small pericardial effusion measuring 8 mm in thickness. Mediastinum/Nodes: The right lobe of the thyroid gland remains diffusely enlarged and heterogeneous suggesting the presence of a 3.1 cm mass. Proximally 50% stenosis of the proximal right mainstem bronchus by the large right hilar/upper lobe mass invading the mediastinum. This is also causing mild narrowing of the proximal left mainstem bronchus and narrowing of the proximal right middle lobe and right lower lobe bronchi. Unremarkable esophagus. Multiple enlarged mediastinal nodes and masses without significant change. Lungs/Pleura: The large right hilar/upper lobe mass continues to cause compression and complete obstruction of the right upper lobe bronchus with associated postobstructive airspace opacity. The airspace opacity has become somewhat more confluence posteriorly on the right. Stable right hilar/upper lobe mass and right upper lobe subpleural masses. Interval small right pleural effusion. Upper Abdomen: Stable large low-density and partially calcified right adrenal mass. Stable partially included small simple appearing left renal cyst. The renal cyst does not need imaging follow-up. Musculoskeletal: Increased size of the right scapular mass with increased bone destruction. This mass is poorly defined and measures approximately 10.1 x 5.7 cm on image number 29/4, previously approximately 5.7 x 3.9 cm. Review of the MIP images confirms the above findings. IMPRESSION: 1. No pulmonary emboli. 2. Mildly progressive compression of the superior vena cava by the large right hilar/right upper lobe mass. 3. Stable large right hilar/right upper lobe mass with associated occlusion of the right upper lobe bronchus and narrowing of the  proximal right middle lobe and right lower lobe bronchi with associated progressive postobstructive airspace opacity. This also continues to cause occlusion of the right upper lobe pulmonary artery and narrowing of the proximal right middle lobe and right lower lobe pulmonary arteries. 4. Stable mediastinal adenopathy and masses. 5. Interval small right pleural effusion. 6. Increased size of the right scapular mass with increased bone destruction. 7. Stable indeterminate large right adrenal mass. 8. Stable mild cardiomegaly with a small pericardial effusion. 9. Aortic atherosclerosis. Aortic Atherosclerosis (ICD10-I70.0). Electronically  Signed   By: Beckie Salts M.D.   On: 02/12/2023 14:07   CT BONE TROCAR/NEEDLE BIOPSY DEEP  Result Date: 02/11/2023 INDICATION: [id number] Lung cancer (HCC) [id number] RIGHT scapular mass. EXAM: CT-GUIDED RIGHT SCAPULAR MASS CORE BIOPSY COMPARISON:  CTA chest, 02/08/2023. MEDICATIONS: None. ANESTHESIA/SEDATION: Moderate (conscious) sedation was employed during this procedure. A total of Versed 2 mg and Fentanyl 150 mcg was administered intravenously. Moderate Sedation Time: 21 minutes. The patient's level of consciousness and vital signs were monitored continuously by radiology nursing throughout the procedure under my direct supervision. CONTRAST:  None. COMPLICATIONS: None immediate. PROCEDURE: RADIATION DOSE REDUCTION: This exam was performed according to the departmental dose-optimization program which includes automated exposure control, adjustment of the mA and/or kV according to patient size and/or use of iterative reconstruction technique. Informed consent was obtained from the patient following an explanation of the procedure, risks, benefits and alternatives. A time out was performed prior to the initiation of the procedure. The patient was positioned prone on the CT table and a limited CT was performed for procedural planning demonstrating lytic RIGHT scapular mass. The  procedure was planned. The operative site was prepped and draped in the usual sterile fashion. Appropriate trajectory was confirmed with a 22 gauge spinal needle after the adjacent tissues were anesthetized with 1% Lidocaine with epinephrine. Under intermittent CT guidance, a 17 gauge coaxial needle was advanced into the peripheral aspect of the mass. Appropriate positioning was confirmed and 2 samples were obtained with an 18 gauge core needle biopsy device. The co-axial needle was removed and hemostasis was achieved with manual compression. A limited postprocedural CT was negative for hemorrhage or additional complication. A dressing was placed. The patient tolerated the procedure well without immediate postprocedural complication. IMPRESSION: Successful CT guided core needle biopsy of RIGHT scapular mass. Roanna Banning, MD [facility] Electronically Signed   By: Roanna Banning M.D.   On: 02/11/2023 16:32   CT ABDOMEN PELVIS W CONTRAST  Result Date: 02/10/2023 CLINICAL DATA:  Prominent intrathoracic malignancy especially involving the right upper lobe and mediastinum, inpatient assessment for staging workup. * Tracking Code: BO * EXAM: CT ABDOMEN AND PELVIS WITH CONTRAST TECHNIQUE: Multidetector CT imaging of the abdomen and pelvis was performed using the standard protocol following bolus administration of intravenous contrast. RADIATION DOSE REDUCTION: This exam was performed according to the departmental dose-optimization program which includes automated exposure control, adjustment of the mA and/or kV according to patient size and/or use of iterative reconstruction technique. CONTRAST:  75mL OMNIPAQUE IOHEXOL 300 MG/ML  SOLN COMPARISON:  CT chest 02/08/2023 FINDINGS: Lower chest: Small right pleural effusion. Small pericardial effusion. Right coronary artery atheromatous vascular calcifications and left anterior descending coronary atherosclerosis.  Hepatobiliary: Unremarkable Pancreas: Obscuration of the pancreatic head due to adjacent extensive adenopathy. I do not see a definite mass arising from the pancreatic head although the region is indistinct and difficult to characterize. No dorsal pancreatic duct dilatation. Spleen: Unremarkable Adrenals/Urinary Tract: Mixed density right adrenal mass measuring 5.2 by 3.9 cm with some internal calcifications and also internal macroscopic fat. 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 Left adrenal gland unremarkable. Urinary bladder and ureters unremarkable. Soft tissue nodule in the right posterior perirenal space 1.1 by 1.0 cm, possibly a  metastatic lesion. Bilateral small nodules in the perirenal space suspicious for metastases occluding a 5 mm posterior left perirenal space nodule on image 35 series 3 and a 1.6 by 1.3 cm lower perirenal space nodule on image 62 series 6. Other small perirenal space nodules are present bilaterally. Small hypodense lesions of the right kidney are probably cysts although technically too small to characterize. No further imaging workup of these lesions is indicated. Suspected periurethral diverticulum on image 89 series 3. Stomach/Bowel: Unremarkable Vascular/Lymphatic: Atherosclerosis is present, including aortoiliac atherosclerotic disease. Porta hepatis/peripancreatic node 2.6 cm in short axis on image 33 series 3. Aortocaval node along the posterior margin of the pancreatic head measures 2.7 cm in short axis on image 38 series 3. Peripancreatic lymph node on image 34 series 3 measures 1.2 cm short axis. Central mesenteric node on image 58 series 3 measures 1.7 cm in short axis. Adjacent central mesenteric lymph node on the same image  measures 0.2 cm in short axis. Reproductive: Uterus absent.  Adnexa unremarkable. Other: Subcutaneous nodule along the left upper quadrant anteriorly, 0.5 cm in diameter on image 22 series 3. This is nonspecific. 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 IMPRESSION: 1. Extensive adenopathy in the porta hepatis/peripancreatic region, retroperitoneum, and central mesentery, compatible with metastatic disease. 2. Mixed density right adrenal mass with internal macroscopic fat and some internal calcifications. 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 3. 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 4. Small right pleural effusion. 5. Small pericardial effusion. 6. Coronary atherosclerosis. 7. 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 8. 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 9. Aortic and coronary atherosclerosis. Aortic Atherosclerosis (ICD10-I70.0). Electronically Signed   By: Gaylyn Rong M.D.   On: 02/10/2023 18:48   CT Angio Chest PE W/Cm &/Or Wo Cm  Result Date: 02/08/2023 CLINICAL DATA:  Concern for pulmonary edema. Right upper lobe pneumonia/mass. EXAM: CT ANGIOGRAPHY CHEST WITH CONTRAST TECHNIQUE: Multidetector CT imaging of the chest was performed using the standard protocol during bolus administration of intravenous contrast. Multiplanar CT image reconstructions and MIPs were obtained to evaluate the vascular anatomy. RADIATION DOSE REDUCTION: This exam was performed according to the departmental dose-optimization program which includes automated exposure control, adjustment of the mA  and/or kV according to patient size and/or use of iterative reconstruction technique. CONTRAST:  75mL OMNIPAQUE IOHEXOL 350 MG/ML SOLN COMPARISON:  Chest radiograph dated 02/08/2023. FINDINGS: Evaluation of this exam is limited due to respiratory motion artifact. Cardiovascular: Top-normal cardiac size. Small pericardial effusion. Mild atherosclerotic calcification of the thoracic aorta. No aneurysmal dilatation or dissection. The origins of the great vessels of the aortic arch appear patent. Evaluation of the pulmonary arteries is limited due to respiratory motion. No pulmonary artery embolus identified. Mediastinum/Nodes: Right hilar mass/adenopathy measures approximately 5 x 7 cm. There is a large area of consolidation in the right upper lobe representing a combination of mass and post obstructive atelectasis or pneumonia. There is extension of the mass into the right apex with compression of the SVC. There is mass effect and compression of the right pulmonary artery with near complete occlusion of the right upper lobe pulmonary artery and high-grade narrowing of the right middle and  right lower lobe pulmonary artery branches. Apparent protrusion of the lobulated mass into the right pulmonary artery suggestive of vascular invasion and tumor thrombus. Additional mediastinal adenopathy in the anterior mediastinum and prevascular space. The esophagus is grossly unremarkable. Asymmetric enlargement and heterogeneity of the right thyroid lobe. This has been evaluated on previous imaging. (ref: J Am Coll Radiol. 2015 Feb;12(2): 143-50).No mediastinal fluid collection. Lungs/Pleura: Consolidative changes of the majority of the right upper lobe as above, combination of malignancy and postobstructive atelectasis/infiltrate. There is mass effect and complete compression of the right upper lobe bronchus. Additional lobulated subpleural mass in the right upper lobe measures approximately 2 x 4 cm. The left lung is clear.  There is no pleural effusion or pneumothorax. There is mild compression of the right mainstem bronchus. The central airways remain patent. Upper Abdomen: Fatty liver. Indeterminate 3.5 x 5.0 cm right adrenal mass as well as partially visualized 2.7 x 2.4 cm hypodense mass in the uncinate process of the pancreas. Further evaluation of the abdominal masses with MRI without and with contrast on a nonemergent/outpatient basis recommended. Musculoskeletal: There is infiltrative mass involving the inferior right scapula with associated pathologic fracture. Osteopenia with degenerative changes of the spine. Review of the MIP images confirms the above findings. IMPRESSION: 1. No CT evidence of pulmonary embolism. 2. 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 Associated complete obstruction of the right upper lobe bronchus and compression of the SVC and invasion into the right pulmonary artery. 3. Mediastinal adenopathy. 4. Infiltrative mass involving the inferior right scapula with associated pathologic fracture. 5. Indeterminate right adrenal mass and partially visualized hypodense mass in the uncinate process of the pancreas. Further evaluation with MRI without and with contrast on a nonemergent/outpatient basis recommended. 6.  Aortic Atherosclerosis (ICD10-I70.0). Electronically Signed   By: Elgie Collard M.D.   On: 02/08/2023 22:23   MR Brain Wo Contrast (neuro protocol)  Result Date: 02/08/2023 CLINICAL DATA:  Slurred speech, nausea and vomiting EXAM: MRI HEAD WITHOUT CONTRAST MRA HEAD WITHOUT CONTRAST TECHNIQUE: Multiplanar, multi-echo pulse sequences of the brain and surrounding structures were acquired without intravenous contrast. Angiographic images of the Circle of Willis were acquired using MRA technique without intravenous contrast. COMPARISON:  None Available. FINDINGS: MRI HEAD FINDINGS Brain: No acute infarct, mass effect or extra-axial collection. No chronic microhemorrhage or  siderosis. Normal white matter signal, parenchymal volume and CSF spaces. The midline structures are normal. 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 Vascular: Normal flow voids. Skull and upper cervical spine: Normal marrow signal. Sinuses/Orbits: No acute or significant finding. Other: None. MRA HEAD FINDINGS POSTERIOR CIRCULATION: --Vertebral arteries: Normal --Inferior cerebellar arteries: Normal. --Basilar artery: Normal. --Superior cerebellar arteries: Normal. --Posterior cerebral arteries: Normal. ANTERIOR CIRCULATION: --Intracranial internal carotid arteries: Normal. --Anterior cerebral arteries (ACA): Normal. --Middle cerebral arteries (MCA): Normal. Anatomic variants: None IMPRESSION: 1. No acute intracranial abnormality. 2. 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 3. Normal intracranial MRA. Electronically Signed   By: Deatra Robinson M.D.   On: 02/08/2023 21:45   MR Angio Head WO CONTRAST  Result Date: 02/08/2023 CLINICAL DATA:  Slurred speech, nausea and vomiting EXAM: MRI HEAD WITHOUT CONTRAST MRA HEAD WITHOUT CONTRAST TECHNIQUE: Multiplanar, multi-echo pulse sequences of the brain and surrounding structures were acquired without intravenous contrast. Angiographic images of the Circle of Willis were acquired using MRA technique without intravenous contrast. COMPARISON:  None Available. FINDINGS: MRI HEAD FINDINGS Brain: No acute infarct, mass effect or extra-axial collection. No chronic microhemorrhage or siderosis. Normal  white matter signal, parenchymal volume and CSF spaces. The midline structures are normal. 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 Vascular: Normal flow voids. Skull and upper cervical spine: Normal marrow signal. Sinuses/Orbits: No acute or significant finding. Other: None. MRA HEAD FINDINGS POSTERIOR CIRCULATION: --Vertebral arteries: Normal --Inferior cerebellar arteries: Normal. --Basilar artery: Normal. --Superior cerebellar arteries: Normal. --Posterior cerebral arteries:  Normal. ANTERIOR CIRCULATION: --Intracranial internal carotid arteries: Normal. --Anterior cerebral arteries (ACA): Normal. --Middle cerebral arteries (MCA): Normal. Anatomic variants: None IMPRESSION: 1. No acute intracranial abnormality. 2. 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 3. Normal intracranial MRA. Electronically Signed   By: Deatra Robinson M.D.   On: 02/08/2023 21:45   DG Chest Port 1 View  Result Date: 02/08/2023 CLINICAL DATA:  Shortness of breath EXAM: PORTABLE CHEST 1 VIEW COMPARISON:  None Available. FINDINGS: Low lung volumes. Borderline cardiomegaly. Dense right upper lobe opacity. No pneumothorax or pleural effusion IMPRESSION: Low lung volumes. Dense right upper lobe opacity, possible pneumonia though neoplasm could also produce this appearance. Consider correlation with chest CT Electronically Signed   By: Jasmine Pang M.D.   On: 02/08/2023 19:14   DG Shoulder Right  Result Date: 02/08/2023 CLINICAL DATA:  Right shoulder pain for 2 weeks EXAM: RIGHT SHOULDER - 2+ VIEW COMPARISON:  None Available. FINDINGS: Mild AC joint and glenohumeral degenerative change. No fracture or malalignment. Partially visualized right upper lobe opacity IMPRESSION: 1. Mild degenerative change. 2. Partially visualized right upper lobe opacity, recommend chest radiograph. Electronically Signed   By: Jasmine Pang M.D.   On: 02/08/2023 19:13   CT HEAD CODE STROKE WO CONTRAST  Result Date: 02/08/2023 CLINICAL DATA:  Code stroke. Neuro deficit, acute, stroke suspected. EXAM: CT HEAD WITHOUT CONTRAST TECHNIQUE: Contiguous axial images were obtained from the base of the skull through the vertex without intravenous contrast. RADIATION DOSE REDUCTION: This exam was performed according to the departmental dose-optimization program which includes automated exposure control, adjustment of the mA and/or kV according to patient size and/or use of iterative reconstruction technique. COMPARISON:  None. FINDINGS: Brain:  Cerebral volume is normal. There is no acute intracranial hemorrhage. No demarcated cortical infarct. No extra-axial fluid collection. No evidence of an intracranial mass. No midline shift. Vascular: No hyperdense vessel.  Atherosclerotic calcifications. Skull: No calvarial fracture or aggressive osseous lesion. Sinuses/Orbits: No mass or acute finding within the imaged orbits. No significant paranasal sinus disease at the imaged levels. ASPECTS [facility] Stroke Program Early CT Score) - Ganglionic level infarction (caudate, lentiform nuclei, internal capsule, insula, M1-M3 cortex): 7 - Supraganglionic infarction (M4-M6 cortex): 3 Total score (0-10 with 10 being normal): 10 No evidence of an acute intracranial abnormality. These results were called by telephone at the time of interpretation on 02/08/2023 at 5:07 pm to provider Vanetta Mulders , who verbally acknowledged these results. IMPRESSION: No evidence of an acute intracranial abnormality. Electronically Signed   By: Jackey Loge D.O.   On: 02/08/2023 17:08    Catarina Hartshorn, DO  [facility]  If 7PM-7AM, please contact night-coverage [email or web address] Password [email or web address] 02/13/2023, 1:53 PM   LOS: 5 days

## 2023-02-13 NOTE — Progress Notes (Signed)
MD Tat in to evaluate pt and talk with pt and family members regarding current status and plan of care.

## 2023-02-13 NOTE — Progress Notes (Signed)
Patient medicated prior to abd surgical wound care. Removed old bandage. Moderate amount of serosanguinous drainage noted on the old dressing. Old packing gauze removed. No odor to the wound.  Wound bed is pink and moist. Wound repacked with 4 4X4 gauze dampened with NS. ( 2 in top wound, one in middle wound and one in bottom wound). Wound covered with dry ABD pads. Abd binder applied per order. Pt assisted up from bed and into chair. Pt tolerated well. VSS. Call bell within reach, chair alarm on for safety. Pt advised to call for needs, states understanding.

## 2023-02-13 NOTE — Progress Notes (Signed)
[facility]  Pain ok when she takes medication. Family in room. 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   BP 131/88 (BP Location: Right Arm)   Pulse (!) 128   Temp 98.3 F (36.8 C) (Oral)   Resp 20   Ht 5\' 6"  (1.676 m)   Wt 74.2 kg   SpO2 100%   BMI 26.40 kg/m  Soft, nondistended, packing in place  Patient s/p Ex lap, SBR for perforation and primary anastomosis. Doing fair.  PRN For pain IS, OOB NG NPO Can hold suction for meds Expect Ileus Zosyn for PNA but also for intraabdominal contamination, will need for abd until 10/9, adding fluconazole due to the yeast for now Leukocytosis up, reactive but also predates the surgery, monitor SCDs, heparin sq  Ambulate  Updated family and patient.  Algis Greenhouse, MD [facility] [address] [place], [place] [address] [phone] (office)

## 2023-02-14 DIAGNOSIS — K631 Perforation of intestine (nontraumatic): Secondary | ICD-10-CM | POA: Diagnosis not present

## 2023-02-14 DIAGNOSIS — J181 Lobar pneumonia, unspecified organism: Secondary | ICD-10-CM | POA: Diagnosis not present

## 2023-02-14 DIAGNOSIS — J189 Pneumonia, unspecified organism: Secondary | ICD-10-CM | POA: Diagnosis not present

## 2023-02-14 LAB — BASIC METABOLIC PANEL
Anion gap: 8 (ref 5–15)
BUN: 17 mg/dL (ref 8–23)
CO2: 25 mmol/L (ref 22–32)
Calcium: 8.2 mg/dL — ABNORMAL LOW (ref 8.9–10.3)
Chloride: 104 mmol/L (ref 98–111)
Creatinine, Ser: 0.58 mg/dL (ref 0.44–1.00)
GFR, Estimated: >60 mL/min (ref >60)
Glucose, Bld: 112 mg/dL — ABNORMAL HIGH (ref 70–99)
Potassium: 3.2 mmol/L — ABNORMAL LOW (ref 3.5–5.1)
Sodium: 137 mmol/L (ref 135–145)

## 2023-02-14 LAB — CULTURE, BLOOD (ROUTINE X 2)
Culture: NO GROWTH
Culture: NO GROWTH
Special Requests: ADEQUATE
Special Requests: ADEQUATE

## 2023-02-14 LAB — CBC
HCT: 25 % — ABNORMAL LOW (ref 36.0–46.0)
Hemoglobin: 7.9 g/dL — ABNORMAL LOW (ref 12.0–15.0)
MCH: 26.2 pg (ref 26.0–34.0)
MCHC: 31.6 g/dL (ref 30.0–36.0)
MCV: 82.8 fL (ref 80.0–100.0)
Platelets: 356 10*3/uL (ref 150–400)
RBC: 3.02 MIL/uL — ABNORMAL LOW (ref 3.87–5.11)
RDW: 17.5 % — ABNORMAL HIGH (ref 11.5–15.5)
WBC: 44.7 10*3/uL — ABNORMAL HIGH (ref 4.0–10.5)
nRBC: 0 % (ref 0.0–0.2)

## 2023-02-14 LAB — PHOSPHORUS: Phosphorus: 2.3 mg/dL — ABNORMAL LOW (ref 2.5–4.6)

## 2023-02-14 LAB — MAGNESIUM: Magnesium: 2.1 mg/dL (ref 1.7–2.4)

## 2023-02-14 MED ORDER — POTASSIUM PHOSPHATES 15 MMOLE/5ML IV SOLN
30.0000 mmol | Freq: Once | INTRAVENOUS | Status: AC
  Administered 2023-02-14: 30 mmol via INTRAVENOUS
  Filled 2023-02-14: qty 10

## 2023-02-14 NOTE — Progress Notes (Signed)
[facility]  No BM or flatus. NG with dark green thick output.   BP 114/75 (BP Location: Left Arm)   Pulse (!) 112   Temp 98.5 F (36.9 C)   Resp 18   Ht 5\' 6"  (1.676 m)   Wt 74.2 kg   SpO2 96%   BMI 26.40 kg/m  Soft, nondistended, binder in place, dressing in place  Patient s/p Ex lap SBR for perforate SB. Doing well. PRN for pain IS, OOB Limit oral meds Can have ice Await bowel function Zosyn until 10/9 for post op contamination Fluconazole for now until final culture completed, buds on the gram stain  Dc foley  Algis Greenhouse, MD [facility] [address] [place], [place] [address] [phone] (office)

## 2023-02-14 NOTE — Progress Notes (Addendum)
Patient has total urine output of 200 mL in foley for whole shift. urine is yellow and clear. Patient has LR @ 179mL/hr continuous fluids running, and has no signs or symptoms of discomfort, A & O x 4.     02/14/23 0519  Vitals  Temp 98 F (36.7 C)  Temp Source Oral  BP 124/77  MAP (mmHg) 91  BP Location Right Arm  BP Method Automatic  Patient Position (if appropriate) Lying  Pulse Rate Source Dinamap  Resp 18  Level of Consciousness  Level of Consciousness Alert  MEWS COLOR  MEWS Score Color Yellow  Oxygen Therapy  SpO2 97 %  O2 Device Room Air  MEWS Score  MEWS Temp 0  MEWS Systolic 0  MEWS Pulse 2  MEWS RR 0  MEWS LOC 0  MEWS Score 2

## 2023-02-14 NOTE — Progress Notes (Signed)
Pt ambulated to bathroom with one assist to void. Voided x1 clear yellow urine with no c/o burning or urgency. Pt tolerated movement well.

## 2023-02-14 NOTE — Progress Notes (Signed)
Pt has been up in chair for most of shift. Pt was able to ambulate in hallway approx 132ft with standby assist x1 this am. Tolerated well. NGT continues to intermittent low wall suction with light green drainage noted. Pt is now allowed to have oral ice chips, provided and is tolerating well.     Foley catheter removed at 1700, pt advised to call for assist to bathroom when she feels urge to void.    Abd surgical incision dressing changed. No drainage noted on abd pad covering wound, gauze removed from wound with light pink serosanguinous drainage noted. Wound bed pink, no odor, no drainage. Wound repacked with saline moistened kerlix, covered with abd pads x2 and s ecured with tape. Pt tolerated well. Abd binder not replaced after dressing change per pt request. Adv pt that we would reapply the next time she needs to get OOB, pt states agreement.

## 2023-02-14 NOTE — Progress Notes (Signed)
PROGRESS NOTE  Christine Poole LKG:[id number] DOB: 01-19-1951 DOA: 02/08/2023 PCP: Renaye Rakers, MD  Brief History:  72 y.o. female with medical history significant of arthritis, glaucoma, hypothyroidism, hypertension, hyperlipidemia, and more presents to the ED with a chief complaint of generalized weakness.  Patient reports she started feeling ill about 1 month ago.  The symptoms have waxed and waned.  She reports she is lost about 23 pounds in that 1 month.  Her main complaint is generalized weakness.  She used to be able to ambulate independently.  Recently she has been counter/furniture surfing because she cannot walk without holding onto something.  She reports increased shortness of breath.  The shortness of breath has been constant unlike the other symptoms which have eased off and then come back several times.  She reports she has a cough.  Cough hurts her right shoulder.  She describes it as a sharp pain. Patient complains of early satiety and decreased po intake.  She denies any dysphagia or odynophagia.  She is a former smoker, but quit many years ago. At arrival she had slurred speech that she attributes to her generalized weakness, but code stroke was called.  In ED, pt speech off intermittently, no other significant neuro deficit. Code stroke activated. CT no acute finding. On exam, pt has intermittent stuttering pattern speech, distractable. MR brain was negative.  Neurology, Dr. Jerel Shepherd, saw the patient and felt Pt speech symptoms with stuttering speech more likely functional.   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  There is some compression of the SVC.  There is also an infiltrative mass involving the right inferior scapula. CT of the abdomen and pelvis showed an adrenal mass on the right with extensive  lymphadenopathy of the porta hepatis, peripancreatic areas, and retroperitoneum.  The patient underwent percutaneous biopsy of her right scapula on 02/11/2023. Her hospital stay was prolonged secondary to development of abdominal pain on 02/12/2023 for which CT of the abdomen and pelvis revealed free air. The patient was taken to the operating room for exploratory laparotomy.  She was found to have small bowel perforation.  She underwent small bowel resection and primary anastomosis with Dr. Algis Greenhouse.   Assessment/Plan: Sepsis -present on admission -presented with fever and tachycardia -PCT 1.05 -due to post-obstructive pneumonia -broaden abx coverage to zosyn -started IVF -lactic acid 1.5 -10/1 blood cultures neg to date   Obstructive pneumonia -02/08/23 CTA chest--5x7 cm RUL mass extending into R-hilum with complete obstruction of RUL bronchus and compression of SVC;   mass effect into R-pulm artery suggesting tumor thrombus;  infiltrative mass of R-inferior scapula -continue zosyn   Small bowel perforation -02/12/2023 CT abdomen noted pneumoperitoneum -02/12/2023--exploratory laparotomy with small bowel resection and primary anastomosis -Continue Zosyn -Postoperative care per Dr. Henreitta Leber -Added fluconazole 10/5 -increase activity -remains npo with NG   Lung massses -CTA chest neg for PE;  results as discussed above -case discussed with med/onc, Dr. Dollene Primrose IR biopsy of scapula unless IR determines there is an easier target -10/3--IR biopsy of scapular area -10/3--discussed with Dr. Tawny Asal will arrange outpt follow next week -10/2 CT abd/pelvis--extensive LN porta hepatis/peripancreatic/retroperitoneal;  R-adrenal mass; bilateral perirenal space nodules   Sinus tachycardia -personally reviewed EKG--sinus tach, nonspecific T-wave changes -CTA chest--negative PE -As the patient is n.p.o., switch diltiazem to  IV Lopressor     Hyperlipidemia -Holding  statin until able to take p.o.   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           Family Communication:  sons updated 10/6   Consultants:  med/onc, IR   Code Status:  FULL    DVT Prophylaxis:  [place] Heparin      Procedures: As Listed in Progress Note Above   Antibiotics: Zosyn 10/2>>           Subjective: Patient denies fevers, chills, headache, chest pain, dyspnea, nausea, vomiting, diarrhea, No flatus, no BM   Objective: Vitals:   02/13/23 1800 02/13/23 2322 02/14/23 0519 02/14/23 1210  BP: 119/79 131/78 124/77 114/75  Pulse: (!) 116 (!) 112    Resp: 18 18 18 18   Temp: 98.4 F (36.9 C) 98.5 F (36.9 C) 98 F (36.7 C) 98.5 F (36.9 C)  TempSrc: Oral Oral Oral   SpO2: 96% 97% 97% 96%  Weight:      Height:        Intake/Output Summary (Last 24 hours) at 02/14/2023 1722 Last data filed at 02/14/2023 1300 Gross per 24 hour  Intake 300 ml  Output 200 ml  Net 100 ml   Weight change:  Exam:  General:  Pt is alert, follows commands appropriately, not in acute distress HEENT: No icterus, No thrush, No neck mass, Hall/AT Cardiovascular: RRR, S1/S2, no rubs, no gallops Respiratory: bibasilar rales.  No wheeze Abdomen: Soft/+BS, mild diffuse tender, non distended, no guarding Extremities: No edema, No lymphangitis, No petechiae, No rashes, no synovitis   Data Reviewed: I have personally reviewed following labs and imaging studies Basic Metabolic Panel: Recent Labs  Lab 02/09/23 0505 02/11/23 0438 02/12/23 0409 02/13/23 0621  02/14/23 0439  NA 133* 130* 132* 134* 137  K 3.9 2.7* 3.0* 3.5 3.2*  CL 99 95* 98 103 104  CO2 15* 22 18* 21* 25  GLUCOSE 69* 122* 106* 150* 112*  BUN 15 7* 10 11 17   CREATININE 0.75 0.59 0.56 0.64 0.58  CALCIUM 8.4* 8.5* 8.6* 8.4* 8.2*  MG 2.2 1.9 1.9 1.9 2.1  PHOS  --   --   --   --  2.3*   Liver Function Tests: Recent Labs  Lab 02/08/23 1632 02/09/23 0505  AST 16 13*  ALT 19 15  ALKPHOS 155* 151*  BILITOT 1.0 0.8  PROT 7.3 6.9  ALBUMIN 2.7* 2.6*   Recent Labs  Lab 02/08/23 1632  LIPASE 31   Recent Labs  Lab 02/09/23 0505  AMMONIA 15   Coagulation Profile: Recent Labs  Lab 02/08/23 1632  INR 1.2   CBC: Recent Labs  Lab 02/08/23 1632 02/08/23 1658 02/09/23 0505 02/11/23 0438 02/12/23 0409 02/13/23 0621 02/14/23 0439  WBC 38.8*  --  41.2* 33.9* 39.9* 44.7* 44.7*  NEUTROABS 34.6*  --  37.0*  --   --  42.0*  --   HGB 11.0*   < > 10.0* 9.9* 10.5* 9.3* 7.9*  HCT 34.6*   < > 33.0* 31.6* 34.3* 29.8* 25.0*  MCV 82.6  --  83.5 81.4 82.5  83.0 82.8  PLT 461*  --  462* 426* 416* 373 356   < > = values in this interval not displayed.   Cardiac Enzymes: No results for input(s): "CKTOTAL", "CKMB", "CKMBINDEX", "TROPONINI" in the last 168 hours. BNP: Invalid input(s): "POCBNP" CBG: Recent Labs  Lab 02/08/23 1652  GLUCAP 99   HbA1C: No results for input(s): "HGBA1C" in the last 72 hours. Urine analysis:    Component Value Date/Time   COLORURINE YELLOW 02/09/2023 0500   APPEARANCEUR CLOUDY (A) 02/09/2023 0500   LABSPEC 1.037 (H) 02/09/2023 0500   PHURINE 5.0 02/09/2023 0500   GLUCOSEU >=500 (A) 02/09/2023 0500   HGBUR MODERATE (A) 02/09/2023 0500   BILIRUBINUR NEGATIVE 02/09/2023 0500   KETONESUR 80 (A) 02/09/2023 0500   PROTEINUR 30 (A) 02/09/2023 0500   NITRITE NEGATIVE 02/09/2023 0500   LEUKOCYTESUR MODERATE (A) 02/09/2023 0500   Sepsis Labs: @LABRCNTIP (procalcitonin:4,lacticidven:4) ) Recent Results (from the past 240 hour(s))  Culture,  blood (Routine X 2) w Reflex to ID Panel     Status: None   Collection Time: 02/09/23  5:05 AM   Specimen: BLOOD LEFT HAND  Result Value Ref Range Status   Specimen Description BLOOD LEFT HAND  Final   Special Requests   Final    BOTTLES DRAWN AEROBIC AND ANAEROBIC Blood Culture adequate volume   Culture   Final    NO GROWTH 5 DAYS Performed at [facility], [address], [place], [place] [address]    Report Status 02/14/2023 FINAL  Final  Culture, blood (Routine X 2) w Reflex to ID Panel     Status: None   Collection Time: 02/09/23  5:07 AM   Specimen: BLOOD  Result Value Ref Range Status   Specimen Description BLOOD BLOOD LEFT HAND  Final   Special Requests   Final    BOTTLES DRAWN AEROBIC AND ANAEROBIC Blood Culture adequate volume   Culture   Final    NO GROWTH 5 DAYS Performed at [facility], [address] [place], [place] [address]    Report Status 02/14/2023 FINAL  Final  Aerobic/Anaerobic Culture w Gram Stain (surgical/deep wound)     Status: None (Preliminary result)   Collection Time: 02/12/23  6:25 PM   Specimen: Path fluid; Body Fluid  Result Value Ref Range Status   Specimen Description   Final    FLUID Performed at [facility], [address] [place], [place] [address]    Special Requests   Final    NONE Performed at [facility], [address] [place], [place] [address]    Gram Stain   Final    MODERATE WBC PRESENT, PREDOMINANTLY PMN RARE BUDDING YEAST SEEN RARE GRAM POSITIVE RODS    Culture   Final    NO GROWTH 2 DAYS NO ANAEROBES ISOLATED; CULTURE IN PROGRESS FOR 5 DAYS Performed at [facility], [address] [place], [place] [address]    Report Status PENDING  Incomplete     Scheduled Meds:  Chlorhexidine Gluconate Cloth  6 each Topical Daily   heparin  5,000 Units Subcutaneous Q8H   levothyroxine  25 mcg Per Tube Q0600   metoprolol tartrate  2.5 mg Intravenous Q6H   Continuous Infusions:  fluconazole (DIFLUCAN) IV 400 mg  (02/14/23 1003)   lactated ringers 100 mL/hr at 02/14/23 1524   piperacillin-tazobactam (ZOSYN)  IV 3.375 g (02/14/23 1556)   potassium PHOSPHATE IVPB (in mmol) 30 mmol (02/14/23 1532)    Procedures/Studies: DG Chest Port 1  View  Result Date: 02/12/2023 CLINICAL DATA:  Nasogastric tube placement. EXAM: PORTABLE CHEST 1 VIEW COMPARISON:  CT earlier today.  Chest radiograph 02/08/2023 FINDINGS: Tip and side port of the enteric tube below the diaphragm in the stomach. Dense right upper lobe opacity as characterized on CT earlier today. Slight gaseous colonic distention in the upper abdomen. IMPRESSION: Tip and side port of the enteric tube below the diaphragm in the stomach. Electronically Signed   By: Narda Rutherford M.D.   On: 02/12/2023 22:22   CT ABDOMEN PELVIS WO CONTRAST  Addendum Date: 02/12/2023   ADDENDUM REPORT: 02/12/2023 16:35 ADDENDUM: Clarification, call was made to the ordering service at 1:10 p.m. [place] standard time or 4:10 pm [place] standard time on 02/12/2023 Electronically Signed   By: Karen Kays M.D.   On: 02/12/2023 16:35   Result Date: 02/12/2023 CLINICAL DATA:  Nonlocalized abdominal pain. EXAM: CT ABDOMEN AND PELVIS WITHOUT CONTRAST TECHNIQUE: Multidetector CT imaging of the abdomen and pelvis was performed following the standard protocol without IV contrast. RADIATION DOSE REDUCTION: This exam was performed according to the departmental dose-optimization program which includes automated exposure control, adjustment of the mA and/or kV according to patient size and/or use of iterative reconstruction technique. COMPARISON:  Separate CT angiogram chest same day 02/12/2023. Abdomen pelvis CT with contrast 2 days ago 02/10/2023 FINDINGS: Lower chest: Please see separate chest CT from same day. Hepatobiliary: On this non IV contrast exam, grossly the liver is preserved. Mildly distended gallbladder with some high density material in this location, possible vicarious excretion of  previous contrast versus sludge or stones. Pancreas: Unremarkable. No pancreatic ductal dilatation or surrounding inflammatory changes. Spleen: Normal in size without focal abnormality. Adrenals/Urinary Tract: Left adrenal gland is preserved. 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 With a heterogeneous appearance however in the history of known malignancy a collision lesion is not excluded. Recommend continued follow up or workup. No abnormal calcifications are seen within either kidney nor along the course of either ureter. Duplicated appearance of the left kidney. Nonspecific perinephric stranding there is also some nodularity in the perinephric spaces as described previously example on the left caudal and lateral to the kidney measures up to 18 mm. Focus on the right posteriorly measures 11 mm. Preserved contours of the urinary bladder. Stomach/Bowel: Stomach is nondilated. Small bowel has several fluid-filled nondilated loops in the abdomen and central pelvis. Loops in the pelvis have wall thickening and some stranding which is new from previous. There are some bubbles of free air identified in the abdomen. Normal appendix. Large bowel has a normal course and caliber. Slight wall thickening in the area of the cecum. This new from previous as well Vascular/Lymphatic: Diffuse vascular calcifications. Normal caliber aorta and IVC. Several abnormal lymph nodes are identified there is seen on the contrast examination. Examples again seen anterior to the IVC, central mesentery. Reproductive: Status post hysterectomy. No adnexal masses. Other: Scattered areas of free air identified. Trace free fluid in the pelvis. Anasarca. Mesenteric stranding. Scattered peritoneal and retroperitoneal nodules identified. Musculoskeletal: Degenerative changes identified along the spine and pelvis. Areas of disc bulging along the lumbar spine with stenosis  particularly at L4-5. IMPRESSION: Interval development of stranding, ill-defined fluid and some free air. There are several loops of small bowel in the central pelvis which has some wall thickening. There is also some thickening along the cecum. Adjacent fluid and stranding. Please correlate for any intervention  otherwise there is a differential including possibility of bowel perforation. There is seen on the prior contrast examination there is presence of abnormal lymph nodes, peritoneal and mesenteric nodules worrisome for neoplasm. Right adrenal mass again identified which very well could be a myelolipoma although with the heterogeneity of the lesion in the other findings a collision tumor is possible. Further workup when appropriate. Please see separate dictation of CT angiogram of the chest from same day Critical Value/emergent results were called by telephone at the time of interpretation on 02/12/2023 at 1:10 pm to provider Aniello Christopoulos , who verbally acknowledged these results. Electronically Signed: By: Karen Kays M.D. On: 02/12/2023 16:14   CT Angio Chest Pulmonary Embolism (PE) W or WO Contrast  Result Date: 02/12/2023 CLINICAL DATA:  Chest pain, shortness of breath and elevated D-dimer. Metastatic lung cancer. EXAM: CT ANGIOGRAPHY CHEST WITH CONTRAST TECHNIQUE: Multidetector CT imaging of the chest was performed using the standard protocol during bolus administration of intravenous contrast. Multiplanar CT image reconstructions and MIPs were obtained to evaluate the vascular anatomy. RADIATION DOSE REDUCTION: This exam was performed according to the departmental dose-optimization program which includes automated exposure control, adjustment of the mA and/or kV according to patient size and/or use of iterative reconstruction technique. CONTRAST:  75mL OMNIPAQUE IOHEXOL 350 MG/ML SOLN COMPARISON:  02/08/2023.  Abdomen and pelvis CT dated 02/10/2023. FINDINGS: Cardiovascular: A large right hilar/right  upper lobe mass is again occluding the right upper lobe pulmonary artery and causing stenosis of the proximal right middle lobe and right lower lobe pulmonary arteries. This is mildly progressive compression of the superior vena cava with marked narrowing inferiorly. No filling defects in the opacified pulmonary arteries. Mild atheromatous aortic calcifications. The heart remains mildly enlarged with a small pericardial effusion measuring 8 mm in thickness. Mediastinum/Nodes: The right lobe of the thyroid gland remains diffusely enlarged and heterogeneous suggesting the presence of a 3.1 cm mass. Proximally 50% stenosis of the proximal right mainstem bronchus by the large right hilar/upper lobe mass invading the mediastinum. This is also causing mild narrowing of the proximal left mainstem bronchus and narrowing of the proximal right middle lobe and right lower lobe bronchi. Unremarkable esophagus. Multiple enlarged mediastinal nodes and masses without significant change. Lungs/Pleura: The large right hilar/upper lobe mass continues to cause compression and complete obstruction of the right upper lobe bronchus with associated postobstructive airspace opacity. The airspace opacity has become somewhat more confluence posteriorly on the right. Stable right hilar/upper lobe mass and right upper lobe subpleural masses. Interval small right pleural effusion. Upper Abdomen: Stable large low-density and partially calcified right adrenal mass. Stable partially included small simple appearing left renal cyst. The renal cyst does not need imaging follow-up. Musculoskeletal: Increased size of the right scapular mass with increased bone destruction. This mass is poorly defined and measures approximately 10.1 x 5.7 cm on image number 29/4, previously approximately 5.7 x 3.9 cm. Review of the MIP images confirms the above findings. IMPRESSION: 1. No pulmonary emboli. 2. Mildly progressive compression of the superior vena cava by  the large right hilar/right upper lobe mass. 3. Stable large right hilar/right upper lobe mass with associated occlusion of the right upper lobe bronchus and narrowing of the proximal right middle lobe and right lower lobe bronchi with associated progressive postobstructive airspace opacity. This also continues to cause occlusion of the right upper lobe pulmonary artery and narrowing of the proximal right middle lobe and right lower lobe pulmonary arteries. 4. Stable mediastinal adenopathy and  masses. 5. Interval small right pleural effusion. 6. Increased size of the right scapular mass with increased bone destruction. 7. Stable indeterminate large right adrenal mass. 8. Stable mild cardiomegaly with a small pericardial effusion. 9. Aortic atherosclerosis. Aortic Atherosclerosis (ICD10-I70.0). Electronically Signed   By: Beckie Salts M.D.   On: 02/12/2023 14:07   CT BONE TROCAR/NEEDLE BIOPSY DEEP  Result Date: 02/11/2023 INDICATION: [id number] Lung cancer (HCC) [id number] RIGHT scapular mass. EXAM: CT-GUIDED RIGHT SCAPULAR MASS CORE BIOPSY COMPARISON:  CTA chest, 02/08/2023. MEDICATIONS: None. ANESTHESIA/SEDATION: Moderate (conscious) sedation was employed during this procedure. A total of Versed 2 mg and Fentanyl 150 mcg was administered intravenously. Moderate Sedation Time: 21 minutes. The patient's level of consciousness and vital signs were monitored continuously by radiology nursing throughout the procedure under my direct supervision. CONTRAST:  None. COMPLICATIONS: None immediate. PROCEDURE: RADIATION DOSE REDUCTION: This exam was performed according to the departmental dose-optimization program which includes automated exposure control, adjustment of the mA and/or kV according to patient size and/or use of iterative reconstruction technique. Informed consent was obtained from the patient following an explanation of the procedure, risks, benefits and alternatives. A time out was performed prior to the initiation  of the procedure. The patient was positioned prone on the CT table and a limited CT was performed for procedural planning demonstrating lytic RIGHT scapular mass. The procedure was planned. The operative site was prepped and draped in the usual sterile fashion. Appropriate trajectory was confirmed with a 22 gauge spinal needle after the adjacent tissues were anesthetized with 1% Lidocaine with epinephrine. Under intermittent CT guidance, a 17 gauge coaxial needle was advanced into the peripheral aspect of the mass. Appropriate positioning was confirmed and 2 samples were obtained with an 18 gauge core needle biopsy device. The co-axial needle was removed and hemostasis was achieved with manual compression. A limited postprocedural CT was negative for hemorrhage or additional complication. A dressing was placed. The patient tolerated the procedure well without immediate postprocedural complication. IMPRESSION: Successful CT guided core needle biopsy of RIGHT scapular mass. Roanna Banning, MD [facility] Electronically Signed   By: Roanna Banning M.D.   On: 02/11/2023 16:32   CT ABDOMEN PELVIS W CONTRAST  Result Date: 02/10/2023 CLINICAL DATA:  Prominent intrathoracic malignancy especially involving the right upper lobe and mediastinum, inpatient assessment for staging workup. * Tracking Code: BO * EXAM: CT ABDOMEN AND PELVIS WITH CONTRAST TECHNIQUE: Multidetector CT imaging of the abdomen and pelvis was performed using the standard protocol following bolus administration of intravenous contrast. RADIATION DOSE REDUCTION: This exam was performed according to the departmental dose-optimization program which includes automated exposure control, adjustment of the mA and/or kV according to patient size and/or use of iterative reconstruction technique. CONTRAST:  75mL OMNIPAQUE IOHEXOL 300 MG/ML  SOLN COMPARISON:  CT chest 02/08/2023 FINDINGS: Lower chest: Small  right pleural effusion. Small pericardial effusion. Right coronary artery atheromatous vascular calcifications and left anterior descending coronary atherosclerosis. Hepatobiliary: Unremarkable Pancreas: Obscuration of the pancreatic head due to adjacent extensive adenopathy. I do not see a definite mass arising from the pancreatic head although the region is indistinct and difficult to characterize. No dorsal pancreatic duct dilatation. Spleen: Unremarkable Adrenals/Urinary Tract: Mixed density right adrenal mass measuring 5.2 by 3.9 cm with some internal calcifications and also internal macroscopic fat. Although the fat content favors benign adrenal myelolipoma, possibility of a collision lesion from metastatic disease is not completely excluded, and there is an adjacent abnormal soft tissue density  1.7 by 1.4 cm nodule along the medial margin of the adrenal gland on image 25 series 3. Left adrenal gland unremarkable. Urinary bladder and ureters unremarkable. Soft tissue nodule in the right posterior perirenal space 1.1 by 1.0 cm, possibly a metastatic lesion. Bilateral small nodules in the perirenal space suspicious for metastases occluding a 5 mm posterior left perirenal space nodule on image 35 series 3 and a 1.6 by 1.3 cm lower perirenal space nodule on image 62 series 6. Other small perirenal space nodules are present bilaterally. Small hypodense lesions of the right kidney are probably cysts although technically too small to characterize. No further imaging workup of these lesions is indicated. Suspected periurethral diverticulum on image 89 series 3. Stomach/Bowel: Unremarkable Vascular/Lymphatic: Atherosclerosis is present, including aortoiliac atherosclerotic disease. Porta hepatis/peripancreatic node 2.6 cm in short axis on image 33 series 3. Aortocaval node along the posterior margin of the pancreatic head measures 2.7 cm in short axis on image 38 series 3. Peripancreatic lymph node on image 34 series 3  measures 1.2 cm short axis. Central mesenteric node on image 58 series 3 measures 1.7 cm in short axis. Adjacent central mesenteric lymph node on the same image measures 0.2 cm in short axis. Reproductive: Uterus absent.  Adnexa unremarkable. Other: Subcutaneous nodule along the left upper quadrant anteriorly, 0.5 cm in diameter on image 22 series 3. This is nonspecific. 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 IMPRESSION: 1. Extensive adenopathy in the porta hepatis/peripancreatic region, retroperitoneum, and central mesentery, compatible with metastatic disease. 2. Mixed density right adrenal mass with internal macroscopic fat and some internal calcifications. 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 3. 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 4. Small right pleural effusion. 5. Small pericardial effusion. 6. Coronary atherosclerosis. 7. 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 8. 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 9. Aortic and coronary atherosclerosis. Aortic Atherosclerosis (ICD10-I70.0). Electronically Signed   By: Gaylyn Rong M.D.   On: 02/10/2023 18:48   CT Angio Chest PE W/Cm &/Or Wo Cm  Result Date: 02/08/2023 CLINICAL DATA:  Concern for pulmonary edema. Right upper lobe pneumonia/mass. EXAM: CT ANGIOGRAPHY CHEST WITH CONTRAST TECHNIQUE: Multidetector CT imaging of the chest was performed using the standard protocol during bolus administration of intravenous contrast. Multiplanar CT image reconstructions and MIPs were obtained to evaluate the vascular anatomy. RADIATION  DOSE REDUCTION: This exam was performed according to the departmental dose-optimization program which includes automated exposure control, adjustment of the mA and/or kV according to patient size and/or use of iterative reconstruction technique. CONTRAST:  75mL OMNIPAQUE IOHEXOL 350 MG/ML SOLN COMPARISON:  Chest radiograph dated 02/08/2023. FINDINGS: Evaluation of this exam is limited due to respiratory motion artifact. Cardiovascular: Top-normal cardiac size. Small pericardial effusion. Mild atherosclerotic calcification of the thoracic aorta. No aneurysmal dilatation or dissection. The origins of the great vessels of the aortic arch appear patent. Evaluation of the pulmonary arteries is limited due to respiratory motion. No pulmonary artery embolus identified. Mediastinum/Nodes: Right hilar mass/adenopathy measures approximately 5 x 7 cm. There is a large area of consolidation in the right upper lobe representing a combination of mass and post obstructive atelectasis or pneumonia. There is extension  of the mass into the right apex with compression of the SVC. There is mass effect and compression of the right pulmonary artery with near complete occlusion of the right upper lobe pulmonary artery and high-grade narrowing of the right middle and right lower lobe pulmonary artery branches. Apparent protrusion of the lobulated mass into the right pulmonary artery suggestive of vascular invasion and tumor thrombus. Additional mediastinal adenopathy in the anterior mediastinum and prevascular space. The esophagus is grossly unremarkable. Asymmetric enlargement and heterogeneity of the right thyroid lobe. This has been evaluated on previous imaging. (ref: J Am Coll Radiol. 2015 Feb;12(2): 143-50).No mediastinal fluid collection. Lungs/Pleura: Consolidative changes of the majority of the right upper lobe as above, combination of malignancy and postobstructive atelectasis/infiltrate. There is mass effect and complete  compression of the right upper lobe bronchus. Additional lobulated subpleural mass in the right upper lobe measures approximately 2 x 4 cm. The left lung is clear. There is no pleural effusion or pneumothorax. There is mild compression of the right mainstem bronchus. The central airways remain patent. Upper Abdomen: Fatty liver. Indeterminate 3.5 x 5.0 cm right adrenal mass as well as partially visualized 2.7 x 2.4 cm hypodense mass in the uncinate process of the pancreas. Further evaluation of the abdominal masses with MRI without and with contrast on a nonemergent/outpatient basis recommended. Musculoskeletal: There is infiltrative mass involving the inferior right scapula with associated pathologic fracture. Osteopenia with degenerative changes of the spine. Review of the MIP images confirms the above findings. IMPRESSION: 1. No CT evidence of pulmonary embolism. 2. 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 Associated complete obstruction of the right upper lobe bronchus and compression of the SVC and invasion into the right pulmonary artery. 3. Mediastinal adenopathy. 4. Infiltrative mass involving the inferior right scapula with associated pathologic fracture. 5. Indeterminate right adrenal mass and partially visualized hypodense mass in the uncinate process of the pancreas. Further evaluation with MRI without and with contrast on a nonemergent/outpatient basis recommended. 6.  Aortic Atherosclerosis (ICD10-I70.0). Electronically Signed   By: Elgie Collard M.D.   On: 02/08/2023 22:23   MR Brain Wo Contrast (neuro protocol)  Result Date: 02/08/2023 CLINICAL DATA:  Slurred speech, nausea and vomiting EXAM: MRI HEAD WITHOUT CONTRAST MRA HEAD WITHOUT CONTRAST TECHNIQUE: Multiplanar, multi-echo pulse sequences of the brain and surrounding structures were acquired without intravenous contrast. Angiographic images of the Circle of Willis were acquired using MRA technique without  intravenous contrast. COMPARISON:  None Available. FINDINGS: MRI HEAD FINDINGS Brain: No acute infarct, mass effect or extra-axial collection. No chronic microhemorrhage or siderosis. Normal white matter signal, parenchymal volume and CSF spaces. The midline structures are normal. 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 Vascular: Normal flow voids. Skull and upper cervical spine: Normal marrow signal. Sinuses/Orbits: No acute or significant finding. Other: None. MRA HEAD FINDINGS POSTERIOR CIRCULATION: --Vertebral arteries: Normal --Inferior cerebellar arteries: Normal. --Basilar artery: Normal. --Superior cerebellar arteries: Normal. --Posterior cerebral arteries: Normal. ANTERIOR CIRCULATION: --Intracranial internal carotid arteries: Normal. --Anterior cerebral arteries (ACA): Normal. --Middle cerebral arteries (MCA): Normal. Anatomic variants: None IMPRESSION: 1. No acute intracranial abnormality. 2. 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 3. Normal intracranial MRA. Electronically Signed   By: Deatra Robinson M.D.   On: 02/08/2023 21:45   MR Angio Head WO CONTRAST  Result Date: 02/08/2023 CLINICAL DATA:  Slurred speech, nausea and vomiting EXAM: MRI HEAD WITHOUT CONTRAST MRA HEAD WITHOUT CONTRAST TECHNIQUE: Multiplanar, multi-echo pulse sequences of the brain and surrounding structures were  acquired without intravenous contrast. Angiographic images of the Circle of Willis were acquired using MRA technique without intravenous contrast. COMPARISON:  None Available. FINDINGS: MRI HEAD FINDINGS Brain: No acute infarct, mass effect or extra-axial collection. No chronic microhemorrhage or siderosis. Normal white matter signal, parenchymal volume and CSF spaces. The midline structures are normal. 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 Vascular: Normal flow voids. Skull and upper cervical spine: Normal marrow signal. Sinuses/Orbits: No acute or significant finding. Other: None. MRA HEAD FINDINGS POSTERIOR  CIRCULATION: --Vertebral arteries: Normal --Inferior cerebellar arteries: Normal. --Basilar artery: Normal. --Superior cerebellar arteries: Normal. --Posterior cerebral arteries: Normal. ANTERIOR CIRCULATION: --Intracranial internal carotid arteries: Normal. --Anterior cerebral arteries (ACA): Normal. --Middle cerebral arteries (MCA): Normal. Anatomic variants: None IMPRESSION: 1. No acute intracranial abnormality. 2. 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 3. Normal intracranial MRA. Electronically Signed   By: Deatra Robinson M.D.   On: 02/08/2023 21:45   DG Chest Port 1 View  Result Date: 02/08/2023 CLINICAL DATA:  Shortness of breath EXAM: PORTABLE CHEST 1 VIEW COMPARISON:  None Available. FINDINGS: Low lung volumes. Borderline cardiomegaly. Dense right upper lobe opacity. No pneumothorax or pleural effusion IMPRESSION: Low lung volumes. Dense right upper lobe opacity, possible pneumonia though neoplasm could also produce this appearance. Consider correlation with chest CT Electronically Signed   By: Jasmine Pang M.D.   On: 02/08/2023 19:14   DG Shoulder Right  Result Date: 02/08/2023 CLINICAL DATA:  Right shoulder pain for 2 weeks EXAM: RIGHT SHOULDER - 2+ VIEW COMPARISON:  None Available. FINDINGS: Mild AC joint and glenohumeral degenerative change. No fracture or malalignment. Partially visualized right upper lobe opacity IMPRESSION: 1. Mild degenerative change. 2. Partially visualized right upper lobe opacity, recommend chest radiograph. Electronically Signed   By: Jasmine Pang M.D.   On: 02/08/2023 19:13   CT HEAD CODE STROKE WO CONTRAST  Result Date: 02/08/2023 CLINICAL DATA:  Code stroke. Neuro deficit, acute, stroke suspected. EXAM: CT HEAD WITHOUT CONTRAST TECHNIQUE: Contiguous axial images were obtained from the base of the skull through the vertex without intravenous contrast. RADIATION DOSE REDUCTION: This exam was performed according to the departmental dose-optimization program which  includes automated exposure control, adjustment of the mA and/or kV according to patient size and/or use of iterative reconstruction technique. COMPARISON:  None. FINDINGS: Brain: Cerebral volume is normal. There is no acute intracranial hemorrhage. No demarcated cortical infarct. No extra-axial fluid collection. No evidence of an intracranial mass. No midline shift. Vascular: No hyperdense vessel.  Atherosclerotic calcifications. Skull: No calvarial fracture or aggressive osseous lesion. Sinuses/Orbits: No mass or acute finding within the imaged orbits. No significant paranasal sinus disease at the imaged levels. ASPECTS [facility] Stroke Program Early CT Score) - Ganglionic level infarction (caudate, lentiform nuclei, internal capsule, insula, M1-M3 cortex): 7 - Supraganglionic infarction (M4-M6 cortex): 3 Total score (0-10 with 10 being normal): 10 No evidence of an acute intracranial abnormality. These results were called by telephone at the time of interpretation on 02/08/2023 at 5:07 pm to provider Vanetta Mulders , who verbally acknowledged these results. IMPRESSION: No evidence of an acute intracranial abnormality. Electronically Signed   By: Jackey Loge D.O.   On: 02/08/2023 17:08    Catarina Hartshorn, DO  [facility]  If 7PM-7AM, please contact night-coverage [email or web address] Password [email or web address] 02/14/2023, 5:22 PM   LOS: 6 days

## 2023-02-15 DIAGNOSIS — J181 Lobar pneumonia, unspecified organism: Secondary | ICD-10-CM | POA: Diagnosis not present

## 2023-02-15 DIAGNOSIS — J189 Pneumonia, unspecified organism: Secondary | ICD-10-CM | POA: Diagnosis not present

## 2023-02-15 DIAGNOSIS — I10 Essential (primary) hypertension: Secondary | ICD-10-CM | POA: Diagnosis not present

## 2023-02-15 DIAGNOSIS — K631 Perforation of intestine (nontraumatic): Secondary | ICD-10-CM | POA: Diagnosis not present

## 2023-02-15 LAB — CBC WITH DIFFERENTIAL/PLATELET
Abs Immature Granulocytes: 0 10*3/uL (ref 0.00–0.07)
Basophils Absolute: 0 10*3/uL (ref 0.0–0.1)
Basophils Relative: 0 %
Eosinophils Absolute: 0 10*3/uL (ref 0.0–0.5)
Eosinophils Relative: 0 %
HCT: 26.1 % — ABNORMAL LOW (ref 36.0–46.0)
Hemoglobin: 8.1 g/dL — ABNORMAL LOW (ref 12.0–15.0)
Lymphocytes Relative: 5 %
Lymphs Abs: 2.3 10*3/uL (ref 0.7–4.0)
MCH: 25.6 pg — ABNORMAL LOW (ref 26.0–34.0)
MCHC: 31 g/dL (ref 30.0–36.0)
MCV: 82.6 fL (ref 80.0–100.0)
Monocytes Absolute: 1.8 10*3/uL — ABNORMAL HIGH (ref 0.1–1.0)
Monocytes Relative: 4 %
Neutro Abs: 42 10*3/uL — ABNORMAL HIGH (ref 1.7–7.7)
Neutrophils Relative %: 91 %
Platelets: 352 10*3/uL (ref 150–400)
RBC: 3.16 MIL/uL — ABNORMAL LOW (ref 3.87–5.11)
RDW: 17.2 % — ABNORMAL HIGH (ref 11.5–15.5)
WBC: 46.1 10*3/uL — ABNORMAL HIGH (ref 4.0–10.5)
nRBC: 0.3 % — ABNORMAL HIGH (ref 0.0–0.2)
nRBC: 1 /100{WBCs} — ABNORMAL HIGH

## 2023-02-15 LAB — MAGNESIUM: Magnesium: 2 mg/dL (ref 1.7–2.4)

## 2023-02-15 LAB — BASIC METABOLIC PANEL WITH GFR
Anion gap: 10 (ref 5–15)
BUN: 14 mg/dL (ref 8–23)
CO2: 25 mmol/L (ref 22–32)
Calcium: 8.4 mg/dL — ABNORMAL LOW (ref 8.9–10.3)
Chloride: 100 mmol/L (ref 98–111)
Creatinine, Ser: 0.58 mg/dL (ref 0.44–1.00)
GFR, Estimated: >60 mL/min
Glucose, Bld: 101 mg/dL — ABNORMAL HIGH (ref 70–99)
Potassium: 3.1 mmol/L — ABNORMAL LOW (ref 3.5–5.1)
Sodium: 135 mmol/L (ref 135–145)

## 2023-02-15 LAB — PHOSPHORUS: Phosphorus: 2.4 mg/dL — ABNORMAL LOW (ref 2.5–4.6)

## 2023-02-15 MED ORDER — POTASSIUM CHLORIDE 10 MEQ/100ML IV SOLN
10.0000 meq | INTRAVENOUS | Status: AC
  Administered 2023-02-15 (×4): 10 meq via INTRAVENOUS
  Filled 2023-02-15 (×2): qty 100

## 2023-02-15 MED ORDER — POTASSIUM PHOSPHATES 15 MMOLE/5ML IV SOLN
30.0000 mmol | Freq: Once | INTRAVENOUS | Status: AC
  Administered 2023-02-16: 30 mmol via INTRAVENOUS
  Filled 2023-02-15: qty 10

## 2023-02-15 NOTE — Progress Notes (Signed)
Mobility Specialist Progress Note:    02/15/23 0945  Mobility  Activity Ambulated with assistance in hallway  Level of Assistance Modified independent, requires aide device or extra time  Assistive Device Other (Comment) (IV pole)  Distance Ambulated (ft) 140 ft  Range of Motion/Exercises Active;All extremities  Activity Response Tolerated well  Mobility Referral Yes  $Mobility charge 1 Mobility  Mobility Specialist Start Time (ACUTE ONLY) 0940  Mobility Specialist Stop Time (ACUTE ONLY) 0955  Mobility Specialist Time Calculation (min) (ACUTE ONLY) 15 min   Pt received in chair, agreeable to mobility. Required SBA during ModI to stand and ambulate using IV pole for stability. Tolerated well, asx throughout. Returned pt to room, left in chair. All needs met.   Lawerance Bach Mobility Specialist Please contact via Special educational needs teacher or  Rehab office at [phone]

## 2023-02-15 NOTE — Progress Notes (Signed)
Patient alert and verbal. Reported complaints of pain to abdomen during shift given PRN for pain, see MAR. Patient ambulated with assistance in hallway and room. NG tube in place set to low intermittent suction. Changed dressing to abdomen per order.

## 2023-02-15 NOTE — Progress Notes (Signed)
PROGRESS NOTE  Christine Poole ZDG:[id number] DOB: 10/16/1950 DOA: 02/08/2023 PCP: Renaye Rakers, MD  Brief History:  72 y.o. female with medical history significant of arthritis, glaucoma, hypothyroidism, hypertension, hyperlipidemia, and more presents to the ED with a chief complaint of generalized weakness.  Patient reports she started feeling ill about 1 month ago.  The symptoms have waxed and waned.  She reports she is lost about 23 pounds in that 1 month.  Her main complaint is generalized weakness.  She used to be able to ambulate independently.  Recently she has been counter/furniture surfing because she cannot walk without holding onto something.  She reports increased shortness of breath.  The shortness of breath has been constant unlike the other symptoms which have eased off and then come back several times.  She reports she has a cough.  Cough hurts her right shoulder.  She describes it as a sharp pain. Patient complains of early satiety and decreased po intake.  She denies any dysphagia or odynophagia.  She is a former smoker, but quit many years ago. At arrival she had slurred speech that she attributes to her generalized weakness, but code stroke was called.  In ED, pt speech off intermittently, no other significant neuro deficit. Code stroke activated. CT no acute finding. On exam, pt has intermittent stuttering pattern speech, distractable. MR brain was negative.  Neurology, Dr. Jerel Shepherd, saw the patient and felt Pt speech symptoms with stuttering speech more likely functional.   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  There is some compression of the SVC.  There is also an infiltrative mass involving the right inferior scapula. CT of the abdomen and pelvis showed an adrenal mass on the right with extensive  lymphadenopathy of the porta hepatis, peripancreatic areas, and retroperitoneum.  The patient underwent percutaneous biopsy of her right scapula on 02/11/2023. Her hospital stay was prolonged secondary to development of abdominal pain on 02/12/2023 for which CT of the abdomen and pelvis revealed free air. The patient was taken to the operating room for exploratory laparotomy.  She was found to have small bowel perforation.  She underwent small bowel resection and primary anastomosis with Dr. Algis Greenhouse.   Assessment/Plan: Sepsis -present on admission -presented with fever and tachycardia -PCT 1.05 -due to post-obstructive pneumonia -initially on zosyn -started IVF -lactic acid 1.5 -10/1 blood cultures neg to date   Obstructive pneumonia -02/08/23 CTA chest--5x7 cm RUL mass extending into R-hilum with complete obstruction of RUL bronchus and compression of SVC;   mass effect into R-pulm artery suggesting tumor thrombus;  infiltrative mass of R-inferior scapula -continue zosyn -stable on RA   Small bowel perforation -02/12/2023 CT abdomen noted pneumoperitoneum -02/12/2023--exploratory laparotomy with small bowel resection and primary anastomosis -Continue Zosyn>>follow intra-op culture -Postoperative care per Dr. Henreitta Leber -Added fluconazole 10/5 -increase activity -remains npo with NG until return of bowel function   Lung massses -CTA chest neg for PE;  results as discussed above -case discussed with med/onc, Dr. Dollene Primrose IR biopsy of scapula unless IR determines there is an easier target -10/3--IR biopsy of scapular area -10/3--discussed with Dr. Tawny Asal will arrange outpt follow next week -10/2 CT abd/pelvis--extensive LN porta hepatis/peripancreatic/retroperitoneal;  R-adrenal mass; bilateral perirenal space nodules  Leukemoid reaction -felt to be reactive due metastatic cancer, obstructive pneumonia -differential without  any concerning WBC precursors -remains  afebrile and hemodynamically stable -repeat CBC with diff   Sinus tachycardia -personally reviewed EKG--sinus tach, nonspecific T-wave changes -CTA chest--negative PE -As the patient is n.p.o., switch diltiazem to IV Lopressor     Hyperlipidemia -Holding statin until able to take p.o.   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 Pain is controlled.  Denies f/c, cp, sob, n/v  Objective: Vitals:   02/14/23 2020 02/15/23 0016 02/15/23 0525 02/15/23 1245  BP: 124/79 (!) 144/73 135/79 134/83  Pulse: 100 (!) 101 (!) 103 100  Resp: 19  18 17   Temp: 98.3 F (36.8 C)  97.9 F (36.6 C) 97.8 F (36.6 C)  TempSrc: Oral  Oral Oral  SpO2: 94%  100% 97%  Weight:      Height:        Intake/Output Summary (Last 24 hours) at 02/15/2023 1743 Last data filed at 02/15/2023 1606 Gross per 24 hour  Intake 2863.91 ml  Output --  Net 2863.91 ml   Weight change:  Exam:  General:  Pt is alert, follows commands appropriately, not in acute distress HEENT: No icterus, No thrush, No neck mass, /AT Cardiovascular: RRR, S1/S2, no rubs, no gallops Respiratory: bibasilar rales. No wheeze Abdomen: Soft/+BS, mild diffuse tender, non distended, no guarding Extremities: No edema, No lymphangitis, No  petechiae, No rashes, no synovitis   Data Reviewed: I have personally reviewed following labs and imaging studies Basic Metabolic Panel: Recent Labs  Lab 02/11/23 0438 02/12/23 0409 02/13/23 0621 02/14/23 0439 02/15/23 0442  NA 130* 132* 134* 137 135  K 2.7* 3.0* 3.5 3.2* 3.1*  CL 95* 98 103 104 100  CO2 22 18* 21* 25 25  GLUCOSE 122* 106* 150* 112* 101*  BUN 7* 10 11 17 14   CREATININE 0.59 0.56 0.64 0.58 0.58  CALCIUM 8.5* 8.6* 8.4* 8.2* 8.4*  MG 1.9 1.9 1.9 2.1 2.0  PHOS  --   --   --  2.3* 2.4*   Liver Function Tests: Recent Labs  Lab 02/09/23 0505  AST 13*  ALT 15  ALKPHOS 151*  BILITOT 0.8  PROT 6.9  ALBUMIN 2.6*   No results for input(s): "LIPASE", "AMYLASE" in the last 168 hours. Recent Labs  Lab 02/09/23 0505  AMMONIA 15   Coagulation Profile: No results for input(s): "INR", "PROTIME" in the last 168 hours. CBC: Recent Labs  Lab 02/09/23 0505 02/11/23 0438 02/12/23 0409 02/13/23 0621 02/14/23 0439 02/15/23 0442  WBC 41.2* 33.9* 39.9* 44.7* 44.7* 46.1*  NEUTROABS 37.0*  --   --  42.0*  --  42.0*  HGB 10.0* 9.9* 10.5* 9.3* 7.9* 8.1*  HCT 33.0* 31.6* 34.3* 29.8* 25.0* 26.1*  MCV  83.5 81.4 82.5 83.0 82.8 82.6  PLT 462* 426* 416* 373 356 352   Cardiac Enzymes: No results for input(s): "CKTOTAL", "CKMB", "CKMBINDEX", "TROPONINI" in the last 168 hours. BNP: Invalid input(s): "POCBNP" CBG: No results for input(s): "GLUCAP" in the last 168 hours. HbA1C: No results for input(s): "HGBA1C" in the last 72 hours. Urine analysis:    Component Value Date/Time   COLORURINE YELLOW 02/09/2023 0500   APPEARANCEUR CLOUDY (A) 02/09/2023 0500   LABSPEC 1.037 (H) 02/09/2023 0500   PHURINE 5.0 02/09/2023 0500   GLUCOSEU >=500 (A) 02/09/2023 0500   HGBUR MODERATE (A) 02/09/2023 0500   BILIRUBINUR NEGATIVE 02/09/2023 0500   KETONESUR 80 (A) 02/09/2023 0500   PROTEINUR 30 (A) 02/09/2023 0500   NITRITE NEGATIVE 02/09/2023 0500   LEUKOCYTESUR MODERATE (A)  02/09/2023 0500   Sepsis Labs: @LABRCNTIP (procalcitonin:4,lacticidven:4) ) Recent Results (from the past 240 hour(s))  Culture, blood (Routine X 2) w Reflex to ID Panel     Status: None   Collection Time: 02/09/23  5:05 AM   Specimen: BLOOD LEFT HAND  Result Value Ref Range Status   Specimen Description BLOOD LEFT HAND  Final   Special Requests   Final    BOTTLES DRAWN AEROBIC AND ANAEROBIC Blood Culture adequate volume   Culture   Final    NO GROWTH 5 DAYS Performed at [facility], [address], [place], [place] [address]    Report Status 02/14/2023 FINAL  Final  Culture, blood (Routine X 2) w Reflex to ID Panel     Status: None   Collection Time: 02/09/23  5:07 AM   Specimen: BLOOD  Result Value Ref Range Status   Specimen Description BLOOD BLOOD LEFT HAND  Final   Special Requests   Final    BOTTLES DRAWN AEROBIC AND ANAEROBIC Blood Culture adequate volume   Culture   Final    NO GROWTH 5 DAYS Performed at [facility], [address], [place], [place] [address]    Report Status 02/14/2023 FINAL  Final  Aerobic/Anaerobic Culture w Gram Stain (surgical/deep wound)     Status: None (Preliminary result)   Collection Time: 02/12/23  6:25 PM   Specimen: Path fluid; Body Fluid  Result Value Ref Range Status   Specimen Description   Final    FLUID Performed at [facility], [address] [place], [place] [address]    Special Requests   Final    NONE Performed at [facility], [address] [place], [place] [address]    Gram Stain   Final    MODERATE WBC PRESENT, PREDOMINANTLY PMN RARE BUDDING YEAST SEEN RARE GRAM POSITIVE RODS Performed at [facility], [address] [place], [place] [address]    Culture   Final    CULTURE REINCUBATED FOR BETTER GROWTH NO ANAEROBES ISOLATED; CULTURE IN PROGRESS FOR 5 DAYS    Report Status PENDING  Incomplete     Scheduled Meds:  Chlorhexidine Gluconate Cloth  6 each Topical Daily   heparin  5,000 Units Subcutaneous Q8H    levothyroxine  25 mcg Per Tube Q0600   metoprolol tartrate  2.5 mg Intravenous Q6H   Continuous Infusions:  fluconazole (DIFLUCAN) IV Stopped (02/15/23 1225)   lactated ringers 100 mL/hr at 02/15/23 1606   piperacillin-tazobactam (ZOSYN)  IV 12.5 mL/hr at 02/15/23 1606   potassium chloride     [START ON 02/16/2023] potassium PHOSPHATE IVPB (in mmol)      Procedures/Studies: DG Chest Port 1 View  Result  Date: 02/12/2023 CLINICAL DATA:  Nasogastric tube placement. EXAM: PORTABLE CHEST 1 VIEW COMPARISON:  CT earlier today.  Chest radiograph 02/08/2023 FINDINGS: Tip and side port of the enteric tube below the diaphragm in the stomach. Dense right upper lobe opacity as characterized on CT earlier today. Slight gaseous colonic distention in the upper abdomen. IMPRESSION: Tip and side port of the enteric tube below the diaphragm in the stomach. Electronically Signed   By: Narda Rutherford M.D.   On: 02/12/2023 22:22   CT ABDOMEN PELVIS WO CONTRAST  Addendum Date: 02/12/2023   ADDENDUM REPORT: 02/12/2023 16:35 ADDENDUM: Clarification, call was made to the ordering service at 1:10 p.m. [place] standard time or 4:10 pm [place] standard time on 02/12/2023 Electronically Signed   By: Karen Kays M.D.   On: 02/12/2023 16:35   Result Date: 02/12/2023 CLINICAL DATA:  Nonlocalized abdominal pain. EXAM: CT ABDOMEN AND PELVIS WITHOUT CONTRAST TECHNIQUE: Multidetector CT imaging of the abdomen and pelvis was performed following the standard protocol without IV contrast. RADIATION DOSE REDUCTION: This exam was performed according to the departmental dose-optimization program which includes automated exposure control, adjustment of the mA and/or kV according to patient size and/or use of iterative reconstruction technique. COMPARISON:  Separate CT angiogram chest same day 02/12/2023. Abdomen pelvis CT with contrast 2 days ago 02/10/2023 FINDINGS: Lower chest: Please see separate chest CT from same day.  Hepatobiliary: On this non IV contrast exam, grossly the liver is preserved. Mildly distended gallbladder with some high density material in this location, possible vicarious excretion of previous contrast versus sludge or stones. Pancreas: Unremarkable. No pancreatic ductal dilatation or surrounding inflammatory changes. Spleen: Normal in size without focal abnormality. Adrenals/Urinary Tract: Left adrenal gland is preserved. 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 With a heterogeneous appearance however in the history of known malignancy a collision lesion is not excluded. Recommend continued follow up or workup. No abnormal calcifications are seen within either kidney nor along the course of either ureter. Duplicated appearance of the left kidney. Nonspecific perinephric stranding there is also some nodularity in the perinephric spaces as described previously example on the left caudal and lateral to the kidney measures up to 18 mm. Focus on the right posteriorly measures 11 mm. Preserved contours of the urinary bladder. Stomach/Bowel: Stomach is nondilated. Small bowel has several fluid-filled nondilated loops in the abdomen and central pelvis. Loops in the pelvis have wall thickening and some stranding which is new from previous. There are some bubbles of free air identified in the abdomen. Normal appendix. Large bowel has a normal course and caliber. Slight wall thickening in the area of the cecum. This new from previous as well Vascular/Lymphatic: Diffuse vascular calcifications. Normal caliber aorta and IVC. Several abnormal lymph nodes are identified there is seen on the contrast examination. Examples again seen anterior to the IVC, central mesentery. Reproductive: Status post hysterectomy. No adnexal masses. Other: Scattered areas of free air identified. Trace free fluid in the pelvis. Anasarca. Mesenteric stranding. Scattered  peritoneal and retroperitoneal nodules identified. Musculoskeletal: Degenerative changes identified along the spine and pelvis. Areas of disc bulging along the lumbar spine with stenosis particularly at L4-5. IMPRESSION: Interval development of stranding, ill-defined fluid and some free air. There are several loops of small bowel in the central pelvis which has some wall thickening. There is also some thickening along the cecum. Adjacent fluid and stranding. Please correlate for any intervention otherwise there is  a differential including possibility of bowel perforation. There is seen on the prior contrast examination there is presence of abnormal lymph nodes, peritoneal and mesenteric nodules worrisome for neoplasm. Right adrenal mass again identified which very well could be a myelolipoma although with the heterogeneity of the lesion in the other findings a collision tumor is possible. Further workup when appropriate. Please see separate dictation of CT angiogram of the chest from same day Critical Value/emergent results were called by telephone at the time of interpretation on 02/12/2023 at 1:10 pm to provider Glenyce Randle , who verbally acknowledged these results. Electronically Signed: By: Karen Kays M.D. On: 02/12/2023 16:14   CT Angio Chest Pulmonary Embolism (PE) W or WO Contrast  Result Date: 02/12/2023 CLINICAL DATA:  Chest pain, shortness of breath and elevated D-dimer. Metastatic lung cancer. EXAM: CT ANGIOGRAPHY CHEST WITH CONTRAST TECHNIQUE: Multidetector CT imaging of the chest was performed using the standard protocol during bolus administration of intravenous contrast. Multiplanar CT image reconstructions and MIPs were obtained to evaluate the vascular anatomy. RADIATION DOSE REDUCTION: This exam was performed according to the departmental dose-optimization program which includes automated exposure control, adjustment of the mA and/or kV according to patient size and/or use of iterative  reconstruction technique. CONTRAST:  75mL OMNIPAQUE IOHEXOL 350 MG/ML SOLN COMPARISON:  02/08/2023.  Abdomen and pelvis CT dated 02/10/2023. FINDINGS: Cardiovascular: A large right hilar/right upper lobe mass is again occluding the right upper lobe pulmonary artery and causing stenosis of the proximal right middle lobe and right lower lobe pulmonary arteries. This is mildly progressive compression of the superior vena cava with marked narrowing inferiorly. No filling defects in the opacified pulmonary arteries. Mild atheromatous aortic calcifications. The heart remains mildly enlarged with a small pericardial effusion measuring 8 mm in thickness. Mediastinum/Nodes: The right lobe of the thyroid gland remains diffusely enlarged and heterogeneous suggesting the presence of a 3.1 cm mass. Proximally 50% stenosis of the proximal right mainstem bronchus by the large right hilar/upper lobe mass invading the mediastinum. This is also causing mild narrowing of the proximal left mainstem bronchus and narrowing of the proximal right middle lobe and right lower lobe bronchi. Unremarkable esophagus. Multiple enlarged mediastinal nodes and masses without significant change. Lungs/Pleura: The large right hilar/upper lobe mass continues to cause compression and complete obstruction of the right upper lobe bronchus with associated postobstructive airspace opacity. The airspace opacity has become somewhat more confluence posteriorly on the right. Stable right hilar/upper lobe mass and right upper lobe subpleural masses. Interval small right pleural effusion. Upper Abdomen: Stable large low-density and partially calcified right adrenal mass. Stable partially included small simple appearing left renal cyst. The renal cyst does not need imaging follow-up. Musculoskeletal: Increased size of the right scapular mass with increased bone destruction. This mass is poorly defined and measures approximately 10.1 x 5.7 cm on image number 29/4,  previously approximately 5.7 x 3.9 cm. Review of the MIP images confirms the above findings. IMPRESSION: 1. No pulmonary emboli. 2. Mildly progressive compression of the superior vena cava by the large right hilar/right upper lobe mass. 3. Stable large right hilar/right upper lobe mass with associated occlusion of the right upper lobe bronchus and narrowing of the proximal right middle lobe and right lower lobe bronchi with associated progressive postobstructive airspace opacity. This also continues to cause occlusion of the right upper lobe pulmonary artery and narrowing of the proximal right middle lobe and right lower lobe pulmonary arteries. 4. Stable mediastinal adenopathy and masses. 5. Interval  small right pleural effusion. 6. Increased size of the right scapular mass with increased bone destruction. 7. Stable indeterminate large right adrenal mass. 8. Stable mild cardiomegaly with a small pericardial effusion. 9. Aortic atherosclerosis. Aortic Atherosclerosis (ICD10-I70.0). Electronically Signed   By: Beckie Salts M.D.   On: 02/12/2023 14:07   CT BONE TROCAR/NEEDLE BIOPSY DEEP  Result Date: 02/11/2023 INDICATION: [id number] Lung cancer (HCC) [id number] RIGHT scapular mass. EXAM: CT-GUIDED RIGHT SCAPULAR MASS CORE BIOPSY COMPARISON:  CTA chest, 02/08/2023. MEDICATIONS: None. ANESTHESIA/SEDATION: Moderate (conscious) sedation was employed during this procedure. A total of Versed 2 mg and Fentanyl 150 mcg was administered intravenously. Moderate Sedation Time: 21 minutes. The patient's level of consciousness and vital signs were monitored continuously by radiology nursing throughout the procedure under my direct supervision. CONTRAST:  None. COMPLICATIONS: None immediate. PROCEDURE: RADIATION DOSE REDUCTION: This exam was performed according to the departmental dose-optimization program which includes automated exposure control, adjustment of the mA and/or kV according to patient size and/or use of iterative  reconstruction technique. Informed consent was obtained from the patient following an explanation of the procedure, risks, benefits and alternatives. A time out was performed prior to the initiation of the procedure. The patient was positioned prone on the CT table and a limited CT was performed for procedural planning demonstrating lytic RIGHT scapular mass. The procedure was planned. The operative site was prepped and draped in the usual sterile fashion. Appropriate trajectory was confirmed with a 22 gauge spinal needle after the adjacent tissues were anesthetized with 1% Lidocaine with epinephrine. Under intermittent CT guidance, a 17 gauge coaxial needle was advanced into the peripheral aspect of the mass. Appropriate positioning was confirmed and 2 samples were obtained with an 18 gauge core needle biopsy device. The co-axial needle was removed and hemostasis was achieved with manual compression. A limited postprocedural CT was negative for hemorrhage or additional complication. A dressing was placed. The patient tolerated the procedure well without immediate postprocedural complication. IMPRESSION: Successful CT guided core needle biopsy of RIGHT scapular mass. Roanna Banning, MD [facility] Electronically Signed   By: Roanna Banning M.D.   On: 02/11/2023 16:32   CT ABDOMEN PELVIS W CONTRAST  Result Date: 02/10/2023 CLINICAL DATA:  Prominent intrathoracic malignancy especially involving the right upper lobe and mediastinum, inpatient assessment for staging workup. * Tracking Code: BO * EXAM: CT ABDOMEN AND PELVIS WITH CONTRAST TECHNIQUE: Multidetector CT imaging of the abdomen and pelvis was performed using the standard protocol following bolus administration of intravenous contrast. RADIATION DOSE REDUCTION: This exam was performed according to the departmental dose-optimization program which includes automated exposure control, adjustment of the mA  and/or kV according to patient size and/or use of iterative reconstruction technique. CONTRAST:  75mL OMNIPAQUE IOHEXOL 300 MG/ML  SOLN COMPARISON:  CT chest 02/08/2023 FINDINGS: Lower chest: Small right pleural effusion. Small pericardial effusion. Right coronary artery atheromatous vascular calcifications and left anterior descending coronary atherosclerosis. Hepatobiliary: Unremarkable Pancreas: Obscuration of the pancreatic head due to adjacent extensive adenopathy. I do not see a definite mass arising from the pancreatic head although the region is indistinct and difficult to characterize. No dorsal pancreatic duct dilatation. Spleen: Unremarkable Adrenals/Urinary Tract: Mixed density right adrenal mass measuring 5.2 by 3.9 cm with some internal calcifications and also internal macroscopic fat. Although the fat content favors benign adrenal myelolipoma, possibility of a collision lesion from metastatic disease is not completely excluded, and there is an adjacent abnormal soft tissue density 1.7 by 1.4  cm nodule along the medial margin of the adrenal gland on image 25 series 3. Left adrenal gland unremarkable. Urinary bladder and ureters unremarkable. Soft tissue nodule in the right posterior perirenal space 1.1 by 1.0 cm, possibly a metastatic lesion. Bilateral small nodules in the perirenal space suspicious for metastases occluding a 5 mm posterior left perirenal space nodule on image 35 series 3 and a 1.6 by 1.3 cm lower perirenal space nodule on image 62 series 6. Other small perirenal space nodules are present bilaterally. Small hypodense lesions of the right kidney are probably cysts although technically too small to characterize. No further imaging workup of these lesions is indicated. Suspected periurethral diverticulum on image 89 series 3. Stomach/Bowel: Unremarkable Vascular/Lymphatic: Atherosclerosis is present, including aortoiliac atherosclerotic disease. Porta hepatis/peripancreatic node 2.6 cm in  short axis on image 33 series 3. Aortocaval node along the posterior margin of the pancreatic head measures 2.7 cm in short axis on image 38 series 3. Peripancreatic lymph node on image 34 series 3 measures 1.2 cm short axis. Central mesenteric node on image 58 series 3 measures 1.7 cm in short axis. Adjacent central mesenteric lymph node on the same image measures 0.2 cm in short axis. Reproductive: Uterus absent.  Adnexa unremarkable. Other: Subcutaneous nodule along the left upper quadrant anteriorly, 0.5 cm in diameter on image 22 series 3. This is nonspecific. 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 IMPRESSION: 1. Extensive adenopathy in the porta hepatis/peripancreatic region, retroperitoneum, and central mesentery, compatible with metastatic disease. 2. Mixed density right adrenal mass with internal macroscopic fat and some internal calcifications. 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 3. 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 4. Small right pleural effusion. 5. Small pericardial effusion. 6. Coronary atherosclerosis. 7. 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 8. 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 9. Aortic and coronary atherosclerosis. Aortic Atherosclerosis (ICD10-I70.0). Electronically Signed   By: Gaylyn Rong M.D.   On: 02/10/2023 18:48   CT Angio Chest PE W/Cm &/Or Wo Cm  Result Date: 02/08/2023 CLINICAL DATA:  Concern for pulmonary edema. Right upper lobe pneumonia/mass. EXAM: CT ANGIOGRAPHY CHEST WITH CONTRAST TECHNIQUE: Multidetector CT imaging of the chest was  performed using the standard protocol during bolus administration of intravenous contrast. Multiplanar CT image reconstructions and MIPs were obtained to evaluate the vascular anatomy. RADIATION DOSE REDUCTION: This exam was performed according to the departmental dose-optimization program which includes automated exposure control, adjustment of the mA and/or kV according to patient size and/or use of iterative reconstruction technique. CONTRAST:  75mL OMNIPAQUE IOHEXOL 350 MG/ML SOLN COMPARISON:  Chest radiograph dated 02/08/2023. FINDINGS: Evaluation of this exam is limited due to respiratory motion artifact. Cardiovascular: Top-normal cardiac size. Small pericardial effusion. Mild atherosclerotic calcification of the thoracic aorta. No aneurysmal dilatation or dissection. The origins of the great vessels of the aortic arch appear patent. Evaluation of the pulmonary arteries is limited due to respiratory motion. No pulmonary artery embolus identified. Mediastinum/Nodes: Right hilar mass/adenopathy measures approximately 5 x 7 cm. There is a large area of consolidation in the right upper lobe representing a combination of mass and post obstructive atelectasis or pneumonia. There is extension of the mass  into the right apex with compression of the SVC. There is mass effect and compression of the right pulmonary artery with near complete occlusion of the right upper lobe pulmonary artery and high-grade narrowing of the right middle and right lower lobe pulmonary artery branches. Apparent protrusion of the lobulated mass into the right pulmonary artery suggestive of vascular invasion and tumor thrombus. Additional mediastinal adenopathy in the anterior mediastinum and prevascular space. The esophagus is grossly unremarkable. Asymmetric enlargement and heterogeneity of the right thyroid lobe. This has been evaluated on previous imaging. (ref: J Am Coll Radiol. 2015 Feb;12(2): 143-50).No mediastinal fluid collection.  Lungs/Pleura: Consolidative changes of the majority of the right upper lobe as above, combination of malignancy and postobstructive atelectasis/infiltrate. There is mass effect and complete compression of the right upper lobe bronchus. Additional lobulated subpleural mass in the right upper lobe measures approximately 2 x 4 cm. The left lung is clear. There is no pleural effusion or pneumothorax. There is mild compression of the right mainstem bronchus. The central airways remain patent. Upper Abdomen: Fatty liver. Indeterminate 3.5 x 5.0 cm right adrenal mass as well as partially visualized 2.7 x 2.4 cm hypodense mass in the uncinate process of the pancreas. Further evaluation of the abdominal masses with MRI without and with contrast on a nonemergent/outpatient basis recommended. Musculoskeletal: There is infiltrative mass involving the inferior right scapula with associated pathologic fracture. Osteopenia with degenerative changes of the spine. Review of the MIP images confirms the above findings. IMPRESSION: 1. No CT evidence of pulmonary embolism. 2. 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 Associated complete obstruction of the right upper lobe bronchus and compression of the SVC and invasion into the right pulmonary artery. 3. Mediastinal adenopathy. 4. Infiltrative mass involving the inferior right scapula with associated pathologic fracture. 5. Indeterminate right adrenal mass and partially visualized hypodense mass in the uncinate process of the pancreas. Further evaluation with MRI without and with contrast on a nonemergent/outpatient basis recommended. 6.  Aortic Atherosclerosis (ICD10-I70.0). Electronically Signed   By: Elgie Collard M.D.   On: 02/08/2023 22:23   MR Brain Wo Contrast (neuro protocol)  Result Date: 02/08/2023 CLINICAL DATA:  Slurred speech, nausea and vomiting EXAM: MRI HEAD WITHOUT CONTRAST MRA HEAD WITHOUT CONTRAST TECHNIQUE: Multiplanar, multi-echo  pulse sequences of the brain and surrounding structures were acquired without intravenous contrast. Angiographic images of the Circle of Willis were acquired using MRA technique without intravenous contrast. COMPARISON:  None Available. FINDINGS: MRI HEAD FINDINGS Brain: No acute infarct, mass effect or extra-axial collection. No chronic microhemorrhage or siderosis. Normal white matter signal, parenchymal volume and CSF spaces. The midline structures are normal. 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 Vascular: Normal flow voids. Skull and upper cervical spine: Normal marrow signal. Sinuses/Orbits: No acute or significant finding. Other: None. MRA HEAD FINDINGS POSTERIOR CIRCULATION: --Vertebral arteries: Normal --Inferior cerebellar arteries: Normal. --Basilar artery: Normal. --Superior cerebellar arteries: Normal. --Posterior cerebral arteries: Normal. ANTERIOR CIRCULATION: --Intracranial internal carotid arteries: Normal. --Anterior cerebral arteries (ACA): Normal. --Middle cerebral arteries (MCA): Normal. Anatomic variants: None IMPRESSION: 1. No acute intracranial abnormality. 2. 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 3. Normal intracranial MRA. Electronically Signed   By: Deatra Robinson M.D.   On: 02/08/2023 21:45   MR Angio Head WO CONTRAST  Result Date: 02/08/2023 CLINICAL DATA:  Slurred speech, nausea and vomiting EXAM: MRI HEAD WITHOUT CONTRAST MRA HEAD WITHOUT CONTRAST TECHNIQUE: Multiplanar, multi-echo pulse sequences of the brain and surrounding structures were acquired without intravenous  contrast. Angiographic images of the Circle of Willis were acquired using MRA technique without intravenous contrast. COMPARISON:  None Available. FINDINGS: MRI HEAD FINDINGS Brain: No acute infarct, mass effect or extra-axial collection. No chronic microhemorrhage or siderosis. Normal white matter signal, parenchymal volume and CSF spaces. The midline structures are normal. Old left cerebellar small vessel  infarct. Vascular: Normal flow voids. Skull and upper cervical spine: Normal marrow signal. Sinuses/Orbits: No acute or significant finding. Other: None. MRA HEAD FINDINGS POSTERIOR CIRCULATION: --Vertebral arteries: Normal --Inferior cerebellar arteries: Normal. --Basilar artery: Normal. --Superior cerebellar arteries: Normal. --Posterior cerebral arteries: Normal. ANTERIOR CIRCULATION: --Intracranial internal carotid arteries: Normal. --Anterior cerebral arteries (ACA): Normal. --Middle cerebral arteries (MCA): Normal. Anatomic variants: None IMPRESSION: 1. No acute intracranial abnormality. 2. 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 3. Normal intracranial MRA. Electronically Signed   By: Deatra Robinson M.D.   On: 02/08/2023 21:45   DG Chest Port 1 View  Result Date: 02/08/2023 CLINICAL DATA:  Shortness of breath EXAM: PORTABLE CHEST 1 VIEW COMPARISON:  None Available. FINDINGS: Low lung volumes. Borderline cardiomegaly. Dense right upper lobe opacity. No pneumothorax or pleural effusion IMPRESSION: Low lung volumes. Dense right upper lobe opacity, possible pneumonia though neoplasm could also produce this appearance. Consider correlation with chest CT Electronically Signed   By: Jasmine Pang M.D.   On: 02/08/2023 19:14   DG Shoulder Right  Result Date: 02/08/2023 CLINICAL DATA:  Right shoulder pain for 2 weeks EXAM: RIGHT SHOULDER - 2+ VIEW COMPARISON:  None Available. FINDINGS: Mild AC joint and glenohumeral degenerative change. No fracture or malalignment. Partially visualized right upper lobe opacity IMPRESSION: 1. Mild degenerative change. 2. Partially visualized right upper lobe opacity, recommend chest radiograph. Electronically Signed   By: Jasmine Pang M.D.   On: 02/08/2023 19:13   CT HEAD CODE STROKE WO CONTRAST  Result Date: 02/08/2023 CLINICAL DATA:  Code stroke. Neuro deficit, acute, stroke suspected. EXAM: CT HEAD WITHOUT CONTRAST TECHNIQUE: Contiguous axial images were obtained from  the base of the skull through the vertex without intravenous contrast. RADIATION DOSE REDUCTION: This exam was performed according to the departmental dose-optimization program which includes automated exposure control, adjustment of the mA and/or kV according to patient size and/or use of iterative reconstruction technique. COMPARISON:  None. FINDINGS: Brain: Cerebral volume is normal. There is no acute intracranial hemorrhage. No demarcated cortical infarct. No extra-axial fluid collection. No evidence of an intracranial mass. No midline shift. Vascular: No hyperdense vessel.  Atherosclerotic calcifications. Skull: No calvarial fracture or aggressive osseous lesion. Sinuses/Orbits: No mass or acute finding within the imaged orbits. No significant paranasal sinus disease at the imaged levels. ASPECTS [facility] Stroke Program Early CT Score) - Ganglionic level infarction (caudate, lentiform nuclei, internal capsule, insula, M1-M3 cortex): 7 - Supraganglionic infarction (M4-M6 cortex): 3 Total score (0-10 with 10 being normal): 10 No evidence of an acute intracranial abnormality. These results were called by telephone at the time of interpretation on 02/08/2023 at 5:07 pm to provider Vanetta Mulders , who verbally acknowledged these results. IMPRESSION: No evidence of an acute intracranial abnormality. Electronically Signed   By: Jackey Loge D.O.   On: 02/08/2023 17:08    Catarina Hartshorn, DO  [facility]  If 7PM-7AM, please contact night-coverage [email or web address] Password [email or web address] 02/15/2023, 5:43 PM   LOS: 7 days

## 2023-02-15 NOTE — Progress Notes (Signed)
[facility]  Doing fair. No BM or flatus. NG in place. Ambulating and voided after foley removal.  BP 135/79 (BP Location: Left Arm)   Pulse (!) 103   Temp 97.9 F (36.6 C) (Oral)   Resp 18   Ht 5\' 6"  (1.676 m)   Wt 74.2 kg   SpO2 100%   BMI 26.40 kg/m  Soft, nondistended, appropriately tender, wound c/d/I with gauze packing, binder replaced  Patient s/p Ex lap SBR for SB perforation. Doing well.  PRN for pain IS, OOB Binder NPO, NG until having bowel function WBC elevated but predates surgery, thought to be reactive and from metastatic cancer, zosyn and fluconazole for gram stain, nothing on cultures yet, would complete 5 days of post op, end 10/9 and cultures should be done by then too Discussed with Dr. Arbutus Leas  Ambulate   Algis Greenhouse, MD [facility] [address] [place], [place] [address] [phone] (office)

## 2023-02-16 DIAGNOSIS — J189 Pneumonia, unspecified organism: Secondary | ICD-10-CM | POA: Diagnosis not present

## 2023-02-16 DIAGNOSIS — E43 Unspecified severe protein-calorie malnutrition: Secondary | ICD-10-CM | POA: Diagnosis not present

## 2023-02-16 DIAGNOSIS — K631 Perforation of intestine (nontraumatic): Secondary | ICD-10-CM | POA: Diagnosis not present

## 2023-02-16 LAB — MAGNESIUM: Magnesium: 2.2 mg/dL (ref 1.7–2.4)

## 2023-02-16 LAB — BASIC METABOLIC PANEL
Anion gap: 12 (ref 5–15)
BUN: 8 mg/dL (ref 8–23)
CO2: 23 mmol/L (ref 22–32)
Calcium: 8.4 mg/dL — ABNORMAL LOW (ref 8.9–10.3)
Chloride: 98 mmol/L (ref 98–111)
Creatinine, Ser: 0.6 mg/dL (ref 0.44–1.00)
GFR, Estimated: >60 mL/min (ref >60)
Glucose, Bld: 64 mg/dL — ABNORMAL LOW (ref 70–99)
Potassium: 3.4 mmol/L — ABNORMAL LOW (ref 3.5–5.1)
Sodium: 133 mmol/L — ABNORMAL LOW (ref 135–145)

## 2023-02-16 LAB — GLUCOSE, CAPILLARY
Glucose-Capillary: 77 mg/dL (ref 70–99)
Glucose-Capillary: 100 mg/dL — ABNORMAL HIGH (ref 70–99)

## 2023-02-16 LAB — PROCALCITONIN: Procalcitonin: 1.56 ng/mL

## 2023-02-16 LAB — MRSA NEXT GEN BY PCR, NASAL: MRSA by PCR Next Gen: NOT DETECTED

## 2023-02-16 MED ORDER — VANCOMYCIN HCL 1250 MG/250ML IV SOLN
1250.0000 mg | INTRAVENOUS | Status: DC
  Administered 2023-02-17: 1250 mg via INTRAVENOUS
  Filled 2023-02-16 (×2): qty 250

## 2023-02-16 MED ORDER — PHENOL 1.4 % MT LIQD
1.0000 | OROMUCOSAL | Status: DC | PRN
  Administered 2023-02-16: 1 via OROMUCOSAL
  Filled 2023-02-16: qty 177

## 2023-02-16 MED ORDER — VANCOMYCIN HCL 1750 MG/350ML IV SOLN
1750.0000 mg | Freq: Once | INTRAVENOUS | Status: AC
  Administered 2023-02-16: 1750 mg via INTRAVENOUS
  Filled 2023-02-16: qty 350

## 2023-02-16 NOTE — Plan of Care (Signed)

## 2023-02-16 NOTE — Progress Notes (Addendum)
PROGRESS NOTE  Christine Poole:[id number] DOB: 02/24/1951 DOA: 02/08/2023 PCP: Renaye Rakers, MD  Brief History:  72 y.o. female with medical history significant of arthritis, glaucoma, hypothyroidism, hypertension, hyperlipidemia, and more presents to the ED with a chief complaint of generalized weakness.  Patient reports she started feeling ill about 1 month ago.  The symptoms have waxed and waned.  She reports she is lost about 23 pounds in that 1 month.  Her main complaint is generalized weakness.  She used to be able to ambulate independently.  Recently she has been counter/furniture surfing because she cannot walk without holding onto something.  She reports increased shortness of breath.  The shortness of breath has been constant unlike the other symptoms which have eased off and then come back several times.  She reports she has a cough.  Cough hurts her right shoulder.  She describes it as a sharp pain. Patient complains of early satiety and decreased po intake.  She denies any dysphagia or odynophagia.  She is a former smoker, but quit many years ago. At arrival she had slurred speech that she attributes to her generalized weakness, but code stroke was called.  In ED, pt speech off intermittently, no other significant neuro deficit. Code stroke activated. CT no acute finding. On exam, pt has intermittent stuttering pattern speech, distractable. MR brain was negative.  Neurology, Dr. Jerel Shepherd, saw the patient and felt Pt speech symptoms with stuttering speech more likely functional.   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  There is some compression of the SVC.  There is also an infiltrative mass involving the right inferior scapula. CT of the abdomen and pelvis showed an adrenal mass on the right with extensive  lymphadenopathy of the porta hepatis, peripancreatic areas, and retroperitoneum.  The patient underwent percutaneous biopsy of her right scapula on 02/11/2023. Her hospital stay was prolonged secondary to development of abdominal pain on 02/12/2023 for which CT of the abdomen and pelvis revealed free air. The patient was taken to the operating room for exploratory laparotomy.  She was found to have small bowel perforation.  She underwent small bowel resection and primary anastomosis with Dr. Algis Greenhouse.   Assessment/Plan: Sepsis -present on admission -presented with fever and tachycardia -PCT 1.05 -due to post-obstructive pneumonia -initially on zosyn -10/8--add vanco -started IVF -lactic acid 1.5 -10/1 blood cultures neg to date   Obstructive pneumonia -02/08/23 CTA chest--5x7 cm RUL mass extending into R-hilum with complete obstruction of RUL bronchus and compression of SVC;   mass effect into R-pulm artery suggesting tumor thrombus;  infiltrative mass of R-inferior scapula -continue zosyn -stable on RA   Small bowel perforation -02/12/2023 CT abdomen noted pneumoperitoneum -02/12/2023--exploratory laparotomy with small bowel resection and primary anastomosis -Continue Zosyn>>follow intra-op culture -Postoperative care per Dr. Henreitta Leber -Added fluconazole 10/5 -increase activity -remains npo with NG until return of bowel function   Lung massses -CTA chest neg for PE;  results as discussed above -case discussed with med/onc, Dr. Dollene Primrose IR biopsy of scapula unless IR determines there is an easier target -10/3--IR biopsy of scapular area -10/3--discussed with Dr. Tawny Asal will arrange outpt follow next week -10/2 CT abd/pelvis--extensive LN porta hepatis/peripancreatic/retroperitoneal;  R-adrenal mass; bilateral perirenal space nodules   Leukemoid reaction -felt to be reactive due metastatic cancer, obstructive  pneumonia -differential without any concerning WBC  precursors -remains afebrile and hemodynamically stable -repeat CBC with diff--no concerning WBC precursors -may need repeat CT chest/abd rule out any new process if continues to increase but overall afebrile with improved tachycardia and remains hemodynamically stable   Sinus tachycardia -personally reviewed EKG--sinus tach, nonspecific T-wave changes -CTA chest--negative PE -As the patient is n.p.o., switch diltiazem to IV Lopressor -was previously tachycardic 130-140 on cardizem/metoprolol>>overall improving   Hyperlipidemia -Holding statin until able to take p.o.   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  Denies f/c, cp, sob, abd pain.  No BM or flatus  Objective: Vitals:   02/15/23 1820 02/15/23 2000 02/16/23 0028 02/16/23 1224  BP: 135/76 129/77 127/74 (!) 146/78  Pulse: 98 95 (!) 101 (!) 104  Resp:  15  18  Temp:  98.2 F (36.8 C)  98.1 F (36.7 C)  TempSrc:  Oral  Oral  SpO2: 96% 96%  98%  Weight:      Height:        Intake/Output Summary (Last 24 hours) at 02/16/2023 1729 Last data filed at 02/16/2023 1700 Gross per 24 hour  Intake 2165.43 ml  Output --  Net 2165.43 ml   Weight  change:  Exam:  General:  Pt is alert, follows commands appropriately, not in acute distress HEENT: No icterus, No thrush, No neck mass, [place]/AT Cardiovascular: RRR, S1/S2, no rubs, no gallops Respiratory: bibasilar rales.  No wheeze Abdomen: Soft/+BS, non tender, non distended, no guarding Extremities: No edema, No lymphangitis, No petechiae, No rashes, no synovitis   Data Reviewed: I have personally reviewed following labs and imaging studies Basic Metabolic Panel: Recent Labs  Lab 02/12/23 0409 02/13/23 0621 02/14/23 0439 02/15/23 0442 02/16/23 0425  NA 132* 134* 137 135 133*  K 3.0* 3.5 3.2* 3.1* 3.4*  CL 98 103 104 100 98  CO2 18* 21* 25 25 23   GLUCOSE 106* 150* 112* 101* 64*  BUN 10 11 17 14 8   CREATININE 0.56 0.64 0.58 0.58 0.60  CALCIUM 8.6* 8.4* 8.2* 8.4* 8.4*  MG 1.9 1.9 2.1 2.0 2.2  PHOS  --   --  2.3* 2.4*  --    Liver Function Tests: No results for input(s): "AST", "ALT", "ALKPHOS", "BILITOT", "PROT", "ALBUMIN" in the last 168 hours. No results for input(s): "LIPASE", "AMYLASE" in the last 168 hours. No results for input(s): "AMMONIA" in the last 168 hours. Coagulation Profile: No results for input(s): "INR", "PROTIME" in the last 168 hours. CBC: Recent Labs  Lab 02/12/23 0409 02/13/23 0621 02/14/23 0439 02/15/23 0442 02/16/23 0425  WBC 39.9* 44.7* 44.7* 46.1* 50.9*  NEUTROABS  --  42.0*  --  42.0* 42.2*  HGB 10.5* 9.3* 7.9* 8.1* 8.2*  HCT 34.3* 29.8* 25.0* 26.1* 26.9*  MCV 82.5 83.0 82.8 82.6 82.5  PLT 416* 373 356 352 311   Cardiac Enzymes: No results for input(s): "CKTOTAL", "CKMB", "CKMBINDEX", "TROPONINI" in the last 168 hours. BNP: Invalid input(s): "POCBNP" CBG: Recent Labs  Lab 02/16/23 0627  GLUCAP 77   HbA1C: No results for input(s): "HGBA1C" in the last 72 hours. Urine analysis:    Component Value Date/Time   COLORURINE YELLOW 02/09/2023 0500   APPEARANCEUR CLOUDY (A) 02/09/2023 0500   LABSPEC 1.037 (H) 02/09/2023 0500    PHURINE 5.0 02/09/2023 0500   GLUCOSEU >=500 (A) 02/09/2023 0500   HGBUR MODERATE (A) 02/09/2023 0500   BILIRUBINUR NEGATIVE 02/09/2023 0500   KETONESUR 80 (A) 02/09/2023 0500   PROTEINUR 30 (A) 02/09/2023 0500   NITRITE NEGATIVE 02/09/2023 0500   LEUKOCYTESUR MODERATE (A) 02/09/2023 0500   Sepsis Labs: @LABRCNTIP (procalcitonin:4,lacticidven:4) ) Recent Results (from the past 240 hour(s))  Culture, blood (Routine X 2) w Reflex to ID Panel     Status: None   Collection Time: 02/09/23  5:05 AM   Specimen: BLOOD LEFT HAND  Result Value Ref Range Status   Specimen Description BLOOD LEFT HAND  Final   Special Requests   Final    BOTTLES DRAWN AEROBIC AND ANAEROBIC Blood Culture adequate volume   Culture   Final    NO GROWTH 5 DAYS Performed at [facility], [address] [place], [place] [address]    Report Status 02/14/2023 FINAL  Final  Culture, blood (Routine X 2) w Reflex to ID Panel     Status: None   Collection Time: 02/09/23  5:07 AM   Specimen: BLOOD  Result Value Ref Range Status   Specimen Description BLOOD BLOOD LEFT HAND  Final   Special Requests   Final    BOTTLES DRAWN AEROBIC AND ANAEROBIC Blood Culture adequate volume   Culture   Final    NO GROWTH 5 DAYS Performed at [facility], [address], [place], [place] [address]    Report Status 02/14/2023 FINAL  Final  Aerobic/Anaerobic Culture w Gram Stain (surgical/deep wound)     Status: None (Preliminary result)   Collection Time: 02/12/23  6:25 PM   Specimen: Path fluid; Body Fluid  Result Value Ref Range Status   Specimen Description   Final    FLUID Performed at [facility], [address], [place], [place] [address]    Special Requests   Final    NONE Performed at [facility], [address], [place], [place] [address]    Gram Stain   Final    MODERATE WBC PRESENT, PREDOMINANTLY PMN RARE BUDDING YEAST SEEN RARE GRAM POSITIVE RODS Performed at [facility], [address], [place], [place]  [address]    Culture   Final    CULTURE REINCUBATED FOR BETTER GROWTH NO ANAEROBES ISOLATED; CULTURE IN PROGRESS FOR 5 DAYS    Report Status PENDING  Incomplete  MRSA Next Gen by PCR, Nasal     Status: None   Collection Time: 02/16/23  8:16 AM   Specimen: Nasal Mucosa; Nasal Swab  Result Value Ref Range Status   MRSA by PCR Next Gen NOT DETECTED NOT DETECTED Final    Comment: (NOTE) The GeneXpert MRSA Assay (FDA approved for NASAL specimens only), is one component of a comprehensive MRSA colonization surveillance program. It is not intended to  diagnose MRSA infection nor to guide or monitor treatment for MRSA infections. Test performance is not FDA approved in patients less than 80 years old. Performed at [facility], [address] [place], [place] [address]      Scheduled Meds:  Chlorhexidine Gluconate Cloth  6 each Topical Daily   heparin  5,000 Units Subcutaneous Q8H   levothyroxine  25 mcg Per Tube Q0600   metoprolol tartrate  2.5 mg Intravenous Q6H   Continuous Infusions:  fluconazole (DIFLUCAN) IV 400 mg (02/16/23 0915)   lactated ringers Stopped (02/15/23 2327)   piperacillin-tazobactam (ZOSYN)  IV 3.375 g (02/16/23 1458)   potassium PHOSPHATE IVPB (in mmol) 30 mmol (02/16/23 1228)   [START ON 02/17/2023] vancomycin      Procedures/Studies: DG Chest Port 1 View  Result Date: 02/12/2023 CLINICAL DATA:  Nasogastric tube placement. EXAM: PORTABLE CHEST 1 VIEW COMPARISON:  CT earlier today.  Chest radiograph 02/08/2023 FINDINGS: Tip and side port of the enteric tube below the diaphragm in the stomach. Dense right upper lobe opacity as characterized on CT earlier today. Slight gaseous colonic distention in the upper abdomen. IMPRESSION: Tip and side port of the enteric tube below the diaphragm in the stomach. Electronically Signed   By: Narda Rutherford M.D.   On: 02/12/2023 22:22   CT ABDOMEN PELVIS WO CONTRAST  Addendum Date: 02/12/2023   ADDENDUM REPORT: 02/12/2023 16:35  ADDENDUM: Clarification, call was made to the ordering service at 1:10 p.m. [place] standard time or 4:10 pm [place] standard time on 02/12/2023 Electronically Signed   By: Karen Kays M.D.   On: 02/12/2023 16:35   Result Date: 02/12/2023 CLINICAL DATA:  Nonlocalized abdominal pain. EXAM: CT ABDOMEN AND PELVIS WITHOUT CONTRAST TECHNIQUE: Multidetector CT imaging of the abdomen and pelvis was performed following the standard protocol without IV contrast. RADIATION DOSE REDUCTION: This exam was performed according to the departmental dose-optimization program which includes automated exposure control, adjustment of the mA and/or kV according to patient size and/or use of iterative reconstruction technique. COMPARISON:  Separate CT angiogram chest same day 02/12/2023. Abdomen pelvis CT with contrast 2 days ago 02/10/2023 FINDINGS: Lower chest: Please see separate chest CT from same day. Hepatobiliary: On this non IV contrast exam, grossly the liver is preserved. Mildly distended gallbladder with some high density material in this location, possible vicarious excretion of previous contrast versus sludge or stones. Pancreas: Unremarkable. No pancreatic ductal dilatation or surrounding inflammatory changes. Spleen: Normal in size without focal abnormality. Adrenals/Urinary Tract: Left adrenal gland is preserved. 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 With a heterogeneous appearance however in the history of known malignancy a collision lesion is not excluded. Recommend continued follow up or workup. No abnormal calcifications are seen within either kidney nor along the course of either ureter. Duplicated appearance of the left kidney. Nonspecific perinephric stranding there is also some nodularity in the perinephric spaces as described previously example on the left caudal and lateral to the kidney measures up to 18 mm. Focus on the right  posteriorly measures 11 mm. Preserved contours of the urinary bladder. Stomach/Bowel: Stomach is nondilated. Small bowel has several fluid-filled nondilated loops in the abdomen and central pelvis. Loops in the pelvis have wall thickening and some stranding which is new from previous. There are some bubbles of free air identified in the abdomen. Normal appendix. Large bowel has a normal course and caliber. Slight wall thickening in the area of  the cecum. This new from previous as well Vascular/Lymphatic: Diffuse vascular calcifications. Normal caliber aorta and IVC. Several abnormal lymph nodes are identified there is seen on the contrast examination. Examples again seen anterior to the IVC, central mesentery. Reproductive: Status post hysterectomy. No adnexal masses. Other: Scattered areas of free air identified. Trace free fluid in the pelvis. Anasarca. Mesenteric stranding. Scattered peritoneal and retroperitoneal nodules identified. Musculoskeletal: Degenerative changes identified along the spine and pelvis. Areas of disc bulging along the lumbar spine with stenosis particularly at L4-5. IMPRESSION: Interval development of stranding, ill-defined fluid and some free air. There are several loops of small bowel in the central pelvis which has some wall thickening. There is also some thickening along the cecum. Adjacent fluid and stranding. Please correlate for any intervention otherwise there is a differential including possibility of bowel perforation. There is seen on the prior contrast examination there is presence of abnormal lymph nodes, peritoneal and mesenteric nodules worrisome for neoplasm. Right adrenal mass again identified which very well could be a myelolipoma although with the heterogeneity of the lesion in the other findings a collision tumor is possible. Further workup when appropriate. Please see separate dictation of CT angiogram of the chest from same day Critical Value/emergent results were  called by telephone at the time of interpretation on 02/12/2023 at 1:10 pm to provider Kalai Baca , who verbally acknowledged these results. Electronically Signed: By: Karen Kays M.D. On: 02/12/2023 16:14   CT Angio Chest Pulmonary Embolism (PE) W or WO Contrast  Result Date: 02/12/2023 CLINICAL DATA:  Chest pain, shortness of breath and elevated D-dimer. Metastatic lung cancer. EXAM: CT ANGIOGRAPHY CHEST WITH CONTRAST TECHNIQUE: Multidetector CT imaging of the chest was performed using the standard protocol during bolus administration of intravenous contrast. Multiplanar CT image reconstructions and MIPs were obtained to evaluate the vascular anatomy. RADIATION DOSE REDUCTION: This exam was performed according to the departmental dose-optimization program which includes automated exposure control, adjustment of the mA and/or kV according to patient size and/or use of iterative reconstruction technique. CONTRAST:  75mL OMNIPAQUE IOHEXOL 350 MG/ML SOLN COMPARISON:  02/08/2023.  Abdomen and pelvis CT dated 02/10/2023. FINDINGS: Cardiovascular: A large right hilar/right upper lobe mass is again occluding the right upper lobe pulmonary artery and causing stenosis of the proximal right middle lobe and right lower lobe pulmonary arteries. This is mildly progressive compression of the superior vena cava with marked narrowing inferiorly. No filling defects in the opacified pulmonary arteries. Mild atheromatous aortic calcifications. The heart remains mildly enlarged with a small pericardial effusion measuring 8 mm in thickness. Mediastinum/Nodes: The right lobe of the thyroid gland remains diffusely enlarged and heterogeneous suggesting the presence of a 3.1 cm mass. Proximally 50% stenosis of the proximal right mainstem bronchus by the large right hilar/upper lobe mass invading the mediastinum. This is also causing mild narrowing of the proximal left mainstem bronchus and narrowing of the proximal right middle lobe and  right lower lobe bronchi. Unremarkable esophagus. Multiple enlarged mediastinal nodes and masses without significant change. Lungs/Pleura: The large right hilar/upper lobe mass continues to cause compression and complete obstruction of the right upper lobe bronchus with associated postobstructive airspace opacity. The airspace opacity has become somewhat more confluence posteriorly on the right. Stable right hilar/upper lobe mass and right upper lobe subpleural masses. Interval small right pleural effusion. Upper Abdomen: Stable large low-density and partially calcified right adrenal mass. Stable partially included small simple appearing left renal cyst. The renal cyst does not need imaging  follow-up. Musculoskeletal: Increased size of the right scapular mass with increased bone destruction. This mass is poorly defined and measures approximately 10.1 x 5.7 cm on image number 29/4, previously approximately 5.7 x 3.9 cm. Review of the MIP images confirms the above findings. IMPRESSION: 1. No pulmonary emboli. 2. Mildly progressive compression of the superior vena cava by the large right hilar/right upper lobe mass. 3. Stable large right hilar/right upper lobe mass with associated occlusion of the right upper lobe bronchus and narrowing of the proximal right middle lobe and right lower lobe bronchi with associated progressive postobstructive airspace opacity. This also continues to cause occlusion of the right upper lobe pulmonary artery and narrowing of the proximal right middle lobe and right lower lobe pulmonary arteries. 4. Stable mediastinal adenopathy and masses. 5. Interval small right pleural effusion. 6. Increased size of the right scapular mass with increased bone destruction. 7. Stable indeterminate large right adrenal mass. 8. Stable mild cardiomegaly with a small pericardial effusion. 9. Aortic atherosclerosis. Aortic Atherosclerosis (ICD10-I70.0). Electronically Signed   By: Beckie Salts M.D.   On:  02/12/2023 14:07   CT BONE TROCAR/NEEDLE BIOPSY DEEP  Result Date: 02/11/2023 INDICATION: [id number] Lung cancer (HCC) [id number] RIGHT scapular mass. EXAM: CT-GUIDED RIGHT SCAPULAR MASS CORE BIOPSY COMPARISON:  CTA chest, 02/08/2023. MEDICATIONS: None. ANESTHESIA/SEDATION: Moderate (conscious) sedation was employed during this procedure. A total of Versed 2 mg and Fentanyl 150 mcg was administered intravenously. Moderate Sedation Time: 21 minutes. The patient's level of consciousness and vital signs were monitored continuously by radiology nursing throughout the procedure under my direct supervision. CONTRAST:  None. COMPLICATIONS: None immediate. PROCEDURE: RADIATION DOSE REDUCTION: This exam was performed according to the departmental dose-optimization program which includes automated exposure control, adjustment of the mA and/or kV according to patient size and/or use of iterative reconstruction technique. Informed consent was obtained from the patient following an explanation of the procedure, risks, benefits and alternatives. A time out was performed prior to the initiation of the procedure. The patient was positioned prone on the CT table and a limited CT was performed for procedural planning demonstrating lytic RIGHT scapular mass. The procedure was planned. The operative site was prepped and draped in the usual sterile fashion. Appropriate trajectory was confirmed with a 22 gauge spinal needle after the adjacent tissues were anesthetized with 1% Lidocaine with epinephrine. Under intermittent CT guidance, a 17 gauge coaxial needle was advanced into the peripheral aspect of the mass. Appropriate positioning was confirmed and 2 samples were obtained with an 18 gauge core needle biopsy device. The co-axial needle was removed and hemostasis was achieved with manual compression. A limited postprocedural CT was negative for hemorrhage or additional complication. A dressing was placed. The patient tolerated the  procedure well without immediate postprocedural complication. IMPRESSION: Successful CT guided core needle biopsy of RIGHT scapular mass. Roanna Banning, MD [facility] Electronically Signed   By: Roanna Banning M.D.   On: 02/11/2023 16:32   CT ABDOMEN PELVIS W CONTRAST  Result Date: 02/10/2023 CLINICAL DATA:  Prominent intrathoracic malignancy especially involving the right upper lobe and mediastinum, inpatient assessment for staging workup. * Tracking Code: BO * EXAM: CT ABDOMEN AND PELVIS WITH CONTRAST TECHNIQUE: Multidetector CT imaging of the abdomen and pelvis was performed using the standard protocol following bolus administration of intravenous contrast. RADIATION DOSE REDUCTION: This exam was performed according to the departmental dose-optimization program which includes automated exposure control, adjustment of the mA and/or kV according to patient  size and/or use of iterative reconstruction technique. CONTRAST:  75mL OMNIPAQUE IOHEXOL 300 MG/ML  SOLN COMPARISON:  CT chest 02/08/2023 FINDINGS: Lower chest: Small right pleural effusion. Small pericardial effusion. Right coronary artery atheromatous vascular calcifications and left anterior descending coronary atherosclerosis. Hepatobiliary: Unremarkable Pancreas: Obscuration of the pancreatic head due to adjacent extensive adenopathy. I do not see a definite mass arising from the pancreatic head although the region is indistinct and difficult to characterize. No dorsal pancreatic duct dilatation. Spleen: Unremarkable Adrenals/Urinary Tract: Mixed density right adrenal mass measuring 5.2 by 3.9 cm with some internal calcifications and also internal macroscopic fat. Although the fat content favors benign adrenal myelolipoma, possibility of a collision lesion from metastatic disease is not completely excluded, and there is an adjacent abnormal soft tissue density 1.7 by 1.4 cm nodule along the medial  margin of the adrenal gland on image 25 series 3. Left adrenal gland unremarkable. Urinary bladder and ureters unremarkable. Soft tissue nodule in the right posterior perirenal space 1.1 by 1.0 cm, possibly a metastatic lesion. Bilateral small nodules in the perirenal space suspicious for metastases occluding a 5 mm posterior left perirenal space nodule on image 35 series 3 and a 1.6 by 1.3 cm lower perirenal space nodule on image 62 series 6. Other small perirenal space nodules are present bilaterally. Small hypodense lesions of the right kidney are probably cysts although technically too small to characterize. No further imaging workup of these lesions is indicated. Suspected periurethral diverticulum on image 89 series 3. Stomach/Bowel: Unremarkable Vascular/Lymphatic: Atherosclerosis is present, including aortoiliac atherosclerotic disease. Porta hepatis/peripancreatic node 2.6 cm in short axis on image 33 series 3. Aortocaval node along the posterior margin of the pancreatic head measures 2.7 cm in short axis on image 38 series 3. Peripancreatic lymph node on image 34 series 3 measures 1.2 cm short axis. Central mesenteric node on image 58 series 3 measures 1.7 cm in short axis. Adjacent central mesenteric lymph node on the same image measures 0.2 cm in short axis. Reproductive: Uterus absent.  Adnexa unremarkable. Other: Subcutaneous nodule along the left upper quadrant anteriorly, 0.5 cm in diameter on image 22 series 3. This is nonspecific. 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 IMPRESSION: 1. Extensive adenopathy in the porta hepatis/peripancreatic region, retroperitoneum, and central mesentery, compatible with metastatic disease. 2. Mixed density right adrenal mass with internal macroscopic fat and some internal calcifications. 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 3. 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 4. Small right pleural effusion. 5. Small pericardial effusion. 6. Coronary atherosclerosis. 7. 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 8. 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 9. Aortic and coronary atherosclerosis. Aortic Atherosclerosis (ICD10-I70.0). Electronically Signed   By: Gaylyn Rong M.D.   On: 02/10/2023 18:48   CT Angio Chest PE W/Cm &/Or Wo Cm  Result Date: 02/08/2023 CLINICAL DATA:  Concern for pulmonary edema. Right upper lobe pneumonia/mass. EXAM: CT ANGIOGRAPHY CHEST WITH CONTRAST TECHNIQUE: Multidetector CT imaging of the chest was performed using the standard protocol during bolus administration of intravenous contrast. Multiplanar CT image reconstructions and MIPs were obtained to evaluate the vascular anatomy. RADIATION DOSE REDUCTION: This exam was performed according to  the departmental dose-optimization program which includes automated exposure control, adjustment of the mA and/or kV according to patient size and/or use of iterative reconstruction technique. CONTRAST:  75mL OMNIPAQUE IOHEXOL 350 MG/ML SOLN COMPARISON:  Chest radiograph dated 02/08/2023. FINDINGS: Evaluation of this exam is limited due to respiratory motion artifact. Cardiovascular: Top-normal cardiac size. Small pericardial effusion. Mild atherosclerotic calcification of the thoracic aorta. No aneurysmal dilatation or dissection. The origins of the great vessels of the aortic arch appear patent. Evaluation of the pulmonary arteries is limited due to respiratory motion. No pulmonary artery embolus identified. Mediastinum/Nodes: Right hilar mass/adenopathy measures approximately 5 x  7 cm. There is a large area of consolidation in the right upper lobe representing a combination of mass and post obstructive atelectasis or pneumonia. There is extension of the mass into the right apex with compression of the SVC. There is mass effect and compression of the right pulmonary artery with near complete occlusion of the right upper lobe pulmonary artery and high-grade narrowing of the right middle and right lower lobe pulmonary artery branches. Apparent protrusion of the lobulated mass into the right pulmonary artery suggestive of vascular invasion and tumor thrombus. Additional mediastinal adenopathy in the anterior mediastinum and prevascular space. The esophagus is grossly unremarkable. Asymmetric enlargement and heterogeneity of the right thyroid lobe. This has been evaluated on previous imaging. (ref: J Am Coll Radiol. 2015 Feb;12(2): 143-50).No mediastinal fluid collection. Lungs/Pleura: Consolidative changes of the majority of the right upper lobe as above, combination of malignancy and postobstructive atelectasis/infiltrate. There is mass effect and complete compression of the right upper lobe bronchus. Additional lobulated subpleural mass in the right upper lobe measures approximately 2 x 4 cm. The left lung is clear. There is no pleural effusion or pneumothorax. There is mild compression of the right mainstem bronchus. The central airways remain patent. Upper Abdomen: Fatty liver. Indeterminate 3.5 x 5.0 cm right adrenal mass as well as partially visualized 2.7 x 2.4 cm hypodense mass in the uncinate process of the pancreas. Further evaluation of the abdominal masses with MRI without and with contrast on a nonemergent/outpatient basis recommended. Musculoskeletal: There is infiltrative mass involving the inferior right scapula with associated pathologic fracture. Osteopenia with degenerative changes of the spine. Review of the MIP images confirms the above findings. IMPRESSION: 1. No CT  evidence of pulmonary embolism. 2. 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 Associated complete obstruction of the right upper lobe bronchus and compression of the SVC and invasion into the right pulmonary artery. 3. Mediastinal adenopathy. 4. Infiltrative mass involving the inferior right scapula with associated pathologic fracture. 5. Indeterminate right adrenal mass and partially visualized hypodense mass in the uncinate process of the pancreas. Further evaluation with MRI without and with contrast on a nonemergent/outpatient basis recommended. 6.  Aortic Atherosclerosis (ICD10-I70.0). Electronically Signed   By: Elgie Collard M.D.   On: 02/08/2023 22:23   MR Brain Wo Contrast (neuro protocol)  Result Date: 02/08/2023 CLINICAL DATA:  Slurred speech, nausea and vomiting EXAM: MRI HEAD WITHOUT CONTRAST MRA HEAD WITHOUT CONTRAST TECHNIQUE: Multiplanar, multi-echo pulse sequences of the brain and surrounding structures were acquired without intravenous contrast. Angiographic images of the Circle of Willis were acquired using MRA technique without intravenous contrast. COMPARISON:  None Available. FINDINGS: MRI HEAD FINDINGS Brain: No acute infarct, mass effect or extra-axial collection. No chronic microhemorrhage or siderosis. Normal white matter signal, parenchymal volume and CSF spaces. The midline structures are normal. 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  Vascular: Normal flow voids. Skull and upper cervical spine: Normal marrow signal. Sinuses/Orbits: No acute or significant finding. Other: None. MRA HEAD FINDINGS POSTERIOR CIRCULATION: --Vertebral arteries: Normal --Inferior cerebellar arteries: Normal. --Basilar artery: Normal. --Superior cerebellar arteries: Normal. --Posterior cerebral arteries: Normal. ANTERIOR CIRCULATION: --Intracranial internal carotid arteries: Normal. --Anterior cerebral arteries (ACA): Normal. --Middle cerebral arteries (MCA): Normal. Anatomic  variants: None IMPRESSION: 1. No acute intracranial abnormality. 2. 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 3. Normal intracranial MRA. Electronically Signed   By: Deatra Robinson M.D.   On: 02/08/2023 21:45   MR Angio Head WO CONTRAST  Result Date: 02/08/2023 CLINICAL DATA:  Slurred speech, nausea and vomiting EXAM: MRI HEAD WITHOUT CONTRAST MRA HEAD WITHOUT CONTRAST TECHNIQUE: Multiplanar, multi-echo pulse sequences of the brain and surrounding structures were acquired without intravenous contrast. Angiographic images of the Circle of Willis were acquired using MRA technique without intravenous contrast. COMPARISON:  None Available. FINDINGS: MRI HEAD FINDINGS Brain: No acute infarct, mass effect or extra-axial collection. No chronic microhemorrhage or siderosis. Normal white matter signal, parenchymal volume and CSF spaces. The midline structures are normal. 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 Vascular: Normal flow voids. Skull and upper cervical spine: Normal marrow signal. Sinuses/Orbits: No acute or significant finding. Other: None. MRA HEAD FINDINGS POSTERIOR CIRCULATION: --Vertebral arteries: Normal --Inferior cerebellar arteries: Normal. --Basilar artery: Normal. --Superior cerebellar arteries: Normal. --Posterior cerebral arteries: Normal. ANTERIOR CIRCULATION: --Intracranial internal carotid arteries: Normal. --Anterior cerebral arteries (ACA): Normal. --Middle cerebral arteries (MCA): Normal. Anatomic variants: None IMPRESSION: 1. No acute intracranial abnormality. 2. 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 3. Normal intracranial MRA. Electronically Signed   By: Deatra Robinson M.D.   On: 02/08/2023 21:45   DG Chest Port 1 View  Result Date: 02/08/2023 CLINICAL DATA:  Shortness of breath EXAM: PORTABLE CHEST 1 VIEW COMPARISON:  None Available. FINDINGS: Low lung volumes. Borderline cardiomegaly. Dense right upper lobe opacity. No pneumothorax or pleural effusion IMPRESSION: Low lung  volumes. Dense right upper lobe opacity, possible pneumonia though neoplasm could also produce this appearance. Consider correlation with chest CT Electronically Signed   By: Jasmine Pang M.D.   On: 02/08/2023 19:14   DG Shoulder Right  Result Date: 02/08/2023 CLINICAL DATA:  Right shoulder pain for 2 weeks EXAM: RIGHT SHOULDER - 2+ VIEW COMPARISON:  None Available. FINDINGS: Mild AC joint and glenohumeral degenerative change. No fracture or malalignment. Partially visualized right upper lobe opacity IMPRESSION: 1. Mild degenerative change. 2. Partially visualized right upper lobe opacity, recommend chest radiograph. Electronically Signed   By: Jasmine Pang M.D.   On: 02/08/2023 19:13   CT HEAD CODE STROKE WO CONTRAST  Result Date: 02/08/2023 CLINICAL DATA:  Code stroke. Neuro deficit, acute, stroke suspected. EXAM: CT HEAD WITHOUT CONTRAST TECHNIQUE: Contiguous axial images were obtained from the base of the skull through the vertex without intravenous contrast. RADIATION DOSE REDUCTION: This exam was performed according to the departmental dose-optimization program which includes automated exposure control, adjustment of the mA and/or kV according to patient size and/or use of iterative reconstruction technique. COMPARISON:  None. FINDINGS: Brain: Cerebral volume is normal. There is no acute intracranial hemorrhage. No demarcated cortical infarct. No extra-axial fluid collection. No evidence of an intracranial mass. No midline shift. Vascular: No hyperdense vessel.  Atherosclerotic calcifications. Skull: No calvarial fracture or aggressive osseous lesion. Sinuses/Orbits: No mass or acute finding within the imaged orbits. No significant paranasal sinus disease at the imaged levels. ASPECTS [facility] Stroke Program Early CT Score) - Ganglionic level  infarction (caudate, lentiform nuclei, internal capsule, insula, M1-M3 cortex): 7 - Supraganglionic infarction (M4-M6 cortex): 3 Total score (0-10 with 10 being  normal): 10 No evidence of an acute intracranial abnormality. These results were called by telephone at the time of interpretation on 02/08/2023 at 5:07 pm to provider Vanetta Mulders , who verbally acknowledged these results. IMPRESSION: No evidence of an acute intracranial abnormality. Electronically Signed   By: Jackey Loge D.O.   On: 02/08/2023 17:08    Catarina Hartshorn, DO  [facility]  If 7PM-7AM, please contact night-coverage [email or web address] Password [email or web address] 02/16/2023, 5:29 PM   LOS: 8 days

## 2023-02-16 NOTE — Progress Notes (Signed)
Nutrition Follow-up  DOCUMENTATION CODES:   Severe malnutrition in context of acute illness/injury  INTERVENTION:   Given severe malnutrition, recommend TPN if unable to begin diet advancement within the next 24-48 hours.   NUTRITION DIAGNOSIS:   Severe Malnutrition related to acute illness (new lung cancer) as evidenced by energy intake < or equal to 50% for > or equal to 5 days, moderate muscle depletion, percent weight loss.  Ongoing   GOAL:   Patient will meet greater than or equal to 90% of their needs  Unmet  MONITOR:   PO intake, Supplement acceptance  REASON FOR ASSESSMENT:   Malnutrition Screening Tool    ASSESSMENT:   72 yo female admitted with generalized weakness, sepsis, PNA, new lung cancer. PMH includes glaucoma, HTN, arthritis, HLD, hypothyroidism.  Patient S/P small bowel resection on 10/4 d/t pneumoperitoneum, free fluid, and small bowel perforation. She has been NPO since surgery. NG tube in place, currently clamped.   Labs reviewed. Na 133, K 3.4 CBG: 77  Medications reviewed and include potassium phosphate IV x 1.  Diet Order:   Diet Order             Diet NPO time specified Except for: Sips with Meds, Ice Chips  Diet effective now                   EDUCATION NEEDS:   Not appropriate for education at this time  Skin:  Skin Assessment: Reviewed RN Assessment  Last BM:  10/2  Height:   Ht Readings from Last 1 Encounters:  02/08/23 5\' 6"  (1.676 m)    Weight:   Wt Readings from Last 1 Encounters:  02/08/23 74.2 kg    Ideal Body Weight:  59.1 kg  BMI:  Body mass index is 26.4 kg/m.  Estimated Nutritional Needs:   Kcal:  1800-2000  Protein:  85-100 gm  Fluid:  1.8-2 L   Gabriel Rainwater RD, LDN, CNSC Please refer to Amion for contact information.

## 2023-02-16 NOTE — Progress Notes (Addendum)
[facility]  No BM or flatus reported. Leukocytosis trending up in the setting of elevated leukocytosis preoperative to 39 with PNA and metastatic cancer thought to be the cause.   No fevers. Tachycardia improved Has been on zosyn and fluconazole to cover gram stain. No growth on culture Will finalize tomorrow and can stop the zosyn/ fluconazole based on STOP it trial.  Discussed leukocytosis with Dr. Arbutus Leas and he thinks some is reactive and that may have to repeat CT scans if not improving to help with dc of the antibiotics. He is going to add Vanc today.  Procalcitoin 9/30 was 1.05 and today is 1.56, so uptrending.   BP 127/74 (BP Location: Left Arm)   Pulse (!) 101   Temp 98.2 F (36.8 C) (Oral)   Resp 15   Ht 5\' 6"  (1.676 m)   Wt 74.2 kg   SpO2 96%   BMI 26.40 kg/m  Soft, nondistended, gauze dressing in place in wound    Patient s/p Ex lap SBR for SB perforation. Doing well.  PRN for pain IS, OOB Binder NPO, NG until having bowel function 10/9 will be day 5 from her Ex lap, SBR, washout and finalized cultures, could stop antibiotics per STOP It trial for the peritoneal cultures then but may need CT scan to ensure no abscess or worsening PNA etc  Discussed with Dr. Arbutus Leas  Ambulate   Algis Greenhouse, MD [facility] [address] [place], [place] [address] [phone] (office)

## 2023-02-16 NOTE — Progress Notes (Signed)
Pharmacy Antibiotic Note  Christine Poole is a 72 y.o. female admitted on 02/08/2023 with pneumonia and sepsis .  Pharmacy has been consulted for vancomycin dosing.  Plan: Vancomycin 1750 mg IV x 1 dose. Vancomycin 1250 mg IV every 24 hours. Zosyn per MD Monitor labs, c/s, and vanco levels as indicated.  Height: 5\' 6"  (167.6 cm) Weight: 74.2 kg (163 lb 9.3 oz) IBW/kg (Calculated) : 59.3  Temp (24hrs), Avg:98 F (36.7 C), Min:97.8 F (36.6 C), Max:98.2 F (36.8 C)  Recent Labs  Lab 02/12/23 0409 02/13/23 0621 02/14/23 0439 02/15/23 0442 02/16/23 0425  WBC 39.9* 44.7* 44.7* 46.1* 50.9*  CREATININE 0.56 0.64 0.58 0.58 0.60    Estimated Creatinine Clearance: 65.5 mL/min (by C-G formula based on SCr of 0.6 mg/dL).    No Known Allergies  Antimicrobials this admission: Vanco 10/8 >> Fluconazole 10/5 >> Zosyn 10/2 >>  Microbiology results: 10/1 BCx: ng  10/8 MRSA PCR: pending 10/4 Body fluid cx: budding yeast and gram + rods  Thank you for allowing pharmacy to be a part of this patient's care.  Tad Moore 02/16/2023 8:39 AM

## 2023-02-17 ENCOUNTER — Inpatient Hospital Stay (HOSPITAL_COMMUNITY): Payer: BC Managed Care – PPO

## 2023-02-17 DIAGNOSIS — J181 Lobar pneumonia, unspecified organism: Secondary | ICD-10-CM | POA: Diagnosis not present

## 2023-02-17 DIAGNOSIS — D72829 Elevated white blood cell count, unspecified: Secondary | ICD-10-CM

## 2023-02-17 DIAGNOSIS — K631 Perforation of intestine (nontraumatic): Secondary | ICD-10-CM | POA: Diagnosis not present

## 2023-02-17 DIAGNOSIS — R918 Other nonspecific abnormal finding of lung field: Secondary | ICD-10-CM | POA: Diagnosis not present

## 2023-02-17 DIAGNOSIS — J189 Pneumonia, unspecified organism: Secondary | ICD-10-CM | POA: Diagnosis not present

## 2023-02-17 LAB — COMPREHENSIVE METABOLIC PANEL
ALT: 13 U/L (ref 0–44)
AST: 20 U/L (ref 15–41)
Albumin: 2 g/dL — ABNORMAL LOW (ref 3.5–5.0)
Alkaline Phosphatase: 185 U/L — ABNORMAL HIGH (ref 38–126)
Anion gap: 15 (ref 5–15)
BUN: 6 mg/dL — ABNORMAL LOW (ref 8–23)
CO2: 20 mmol/L — ABNORMAL LOW (ref 22–32)
Calcium: 8.2 mg/dL — ABNORMAL LOW (ref 8.9–10.3)
Chloride: 98 mmol/L (ref 98–111)
Creatinine, Ser: 0.62 mg/dL (ref 0.44–1.00)
GFR, Estimated: >60 mL/min (ref >60)
Glucose, Bld: 73 mg/dL (ref 70–99)
Potassium: 3.1 mmol/L — ABNORMAL LOW (ref 3.5–5.1)
Sodium: 133 mmol/L — ABNORMAL LOW (ref 135–145)
Total Bilirubin: 0.9 mg/dL (ref 0.3–1.2)
Total Protein: 6.1 g/dL — ABNORMAL LOW (ref 6.5–8.1)

## 2023-02-17 LAB — TECHNOLOGIST SMEAR REVIEW

## 2023-02-17 LAB — CBC WITH DIFFERENTIAL/PLATELET
Abs Immature Granulocytes: 0 10*3/uL (ref 0.00–0.07)
Basophils Absolute: 0 10*3/uL (ref 0.0–0.1)
Basophils Relative: 0 %
Eosinophils Absolute: 0.5 10*3/uL (ref 0.0–0.5)
Eosinophils Relative: 1 %
HCT: 25.8 % — ABNORMAL LOW (ref 36.0–46.0)
Hemoglobin: 8.2 g/dL — ABNORMAL LOW (ref 12.0–15.0)
Lymphocytes Relative: 6 %
Lymphs Abs: 3.1 10*3/uL (ref 0.7–4.0)
MCH: 26.1 pg (ref 26.0–34.0)
MCHC: 31.8 g/dL (ref 30.0–36.0)
MCV: 82.2 fL (ref 80.0–100.0)
Monocytes Absolute: 1 10*3/uL (ref 0.1–1.0)
Monocytes Relative: 2 %
Neutro Abs: 47.7 10*3/uL — ABNORMAL HIGH (ref 1.7–7.7)
Neutrophils Relative %: 91 %
Platelets: 152 10*3/uL (ref 150–400)
RBC: 3.14 MIL/uL — ABNORMAL LOW (ref 3.87–5.11)
RDW: 17.7 % — ABNORMAL HIGH (ref 11.5–15.5)
WBC: 52.4 10*3/uL (ref 4.0–10.5)
nRBC: 0.2 % (ref 0.0–0.2)

## 2023-02-17 LAB — PHOSPHORUS: Phosphorus: 2.7 mg/dL (ref 2.5–4.6)

## 2023-02-17 LAB — PROCALCITONIN: Procalcitonin: 1.57 ng/mL

## 2023-02-17 LAB — MAGNESIUM: Magnesium: 2 mg/dL (ref 1.7–2.4)

## 2023-02-17 MED ORDER — POTASSIUM CHLORIDE 10 MEQ/100ML IV SOLN
10.0000 meq | INTRAVENOUS | Status: AC
  Administered 2023-02-17 (×4): 10 meq via INTRAVENOUS
  Filled 2023-02-17 (×3): qty 100

## 2023-02-17 MED ORDER — BOOST / RESOURCE BREEZE PO LIQD CUSTOM
1.0000 | Freq: Three times a day (TID) | ORAL | Status: DC
  Administered 2023-02-17 – 2023-02-19 (×3): 1 via ORAL

## 2023-02-17 MED ORDER — IOHEXOL 300 MG/ML  SOLN
100.0000 mL | Freq: Once | INTRAMUSCULAR | Status: AC | PRN
  Administered 2023-02-17: 100 mL via INTRAVENOUS

## 2023-02-17 MED ORDER — MORPHINE SULFATE (PF) 2 MG/ML IV SOLN
1.0000 mg | INTRAVENOUS | Status: DC | PRN
  Administered 2023-02-18 – 2023-02-19 (×2): 1 mg via INTRAVENOUS
  Filled 2023-02-17 (×3): qty 1

## 2023-02-17 MED ORDER — POTASSIUM CHLORIDE 10 MEQ/100ML IV SOLN
10.0000 meq | INTRAVENOUS | Status: DC
  Filled 2023-02-17: qty 100

## 2023-02-17 NOTE — Progress Notes (Addendum)
[facility]  Contrast through to colon. Bms reported. Can remove NG. I see a small interloop collection but looks simple fluid to me, not like an abscess.   Post obstructive PNA on right looks same? Difficult to say.   NG can come out. Can have clears.  Official read pending Continue antibiotics for now Leukocytosis may be driven from cancer/ PNA? As was 38-44 prior to surgery   Algis Greenhouse, MD [facility] [address] [place], [place] [address] [phone] (office)

## 2023-02-17 NOTE — Progress Notes (Signed)
[facility]  NG in place. Had Bm. Due to rising leukocytosis discussed CT c/a/p with Dr. Laural Benes to ensure no worsening PNA, abscess in the abdominal cavity.  BP (!) 147/87 (BP Location: Left Arm)   Pulse (!) 117   Temp 98.1 F (36.7 C) (Oral)   Resp 20   Ht 5\' 6"  (1.676 m)   Wt 74.2 kg   SpO2 97%   BMI 26.40 kg/m  Soft nondistended, appropriately tender, midline with gauze packing  Patient s/p Ex lap SBR for SB perforation. Doing well. Had BM.  PRN for pain IS, OOB Binder NPO, NG until having bowel function 10/9 will be day 5 from her Ex lap, SBR, washout and finalized cultures, could stop antibiotics per STOP It trial for the peritoneal cultures then but may need continue due to the leukocytosis  CT today to ensure no abscess in the abdomen,  Discussed with Dr. Laural Benes  Ambulate  Algis Greenhouse, MD [facility] [address] [place], [place] [address] [phone] (office)

## 2023-02-17 NOTE — Progress Notes (Deleted)
   02/17/23 4098  Provider Notification  Provider Name/Title Shea Stakes  Date Provider Notified 02/17/23  Time Provider Notified [phone]  Method of Notification Page (secure chat)  Notification Reason Critical Result  Test performed and critical result WBC 52.4  Date Critical Result Received 02/17/23  Time Critical Result Received 0640  Provider response  (Waiting response)

## 2023-02-17 NOTE — Progress Notes (Signed)
   02/17/23 4098  Provider Notification  Provider Name/Title Shea Stakes  Date Provider Notified 02/17/23  Time Provider Notified [phone]  Method of Notification Page (secure chat)  Notification Reason Critical Result  Test performed and critical result WBC 52.4  Date Critical Result Received 02/17/23  Time Critical Result Received 0640  Provider response  (Waiting response)

## 2023-02-17 NOTE — TOC Initial Note (Signed)
Transition of Care [facility]) - Initial/Assessment Note    Patient Details  Name: Christine Poole MRN: [id number] Date of Birth: 10-22-1950  Transition of Care [facility]) CM/SW Contact:    Karn Cassis, LCSW Phone Number: 02/17/2023, 12:37 PM  Clinical Narrative:  Assessment completed due to high risk readmission score. Pt's son lives with her. Pt works full-time. PT evaluated pt and no follow up needed. TOC will follow.                  Expected Discharge Plan: Home/Self Care Barriers to Discharge: Continued Medical Work up   Patient Goals and CMS Choice Patient states their goals for this hospitalization and ongoing recovery are:: return home   Choice offered to / list presented to : Patient [place] ownership interest in [facility].provided to::  (n/a)    Expected Discharge Plan and Services       Living arrangements for the past 2 months: Single Family Home                                      Prior Living Arrangements/Services Living arrangements for the past 2 months: Single Family Home Lives with:: Adult Children Patient language and need for interpreter reviewed:: Yes Do you feel safe going back to the place where you live?: Yes            Criminal Activity/Legal Involvement Pertinent to Current Situation/Hospitalization: No - Comment as needed  Activities of Daily Living   ADL Screening (condition at time of admission) Independently performs ADLs?: Yes (appropriate for developmental age) Does the patient have a NEW difficulty with bathing/dressing/toileting/self-feeding that is expected to last >3 days?: No Does the patient have a NEW difficulty with getting in/out of bed, walking, or climbing stairs that is expected to last >3 days?: Yes (Initiates electronic notice to provider for possible PT consult) Does the patient have a NEW difficulty with communication that is expected to last >3 days?: No Is the patient deaf or have  difficulty hearing?: No Does the patient have difficulty seeing, even when wearing glasses/contacts?: No Does the patient have difficulty concentrating, remembering, or making decisions?: No  Permission Sought/Granted                  Emotional Assessment     Affect (typically observed): Appropriate Orientation: : Oriented to Self, Oriented to Place, Oriented to  Time, Oriented to Situation Alcohol / Substance Use: Not Applicable Psych Involvement: No (comment)  Admission diagnosis:  Slurred speech [R47.81] CAP (community acquired pneumonia) [J18.9] Pneumonia of right upper lobe due to infectious organism [J18.9] Patient Active Problem List   Diagnosis Date Noted   Free intraperitoneal air 02/12/2023   Small bowel perforation (HCC) 02/12/2023   Mass of right lung 02/11/2023   Obstructive pneumonia 02/10/2023   Lobar pneumonia (HCC) 02/10/2023   SIRS (systemic inflammatory response syndrome) (HCC) 02/09/2023   Hypokalemia 02/09/2023   Essential hypertension 02/09/2023   HLD (hyperlipidemia) 02/09/2023   Lung cancer (HCC) 02/09/2023   Slurred speech 02/09/2023   Sepsis due to pneumonia (HCC) 02/09/2023   Protein-calorie malnutrition, severe 02/09/2023   CAP (community acquired pneumonia) 02/08/2023   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 [place] [address] Phone: [phone] Fax: [phone]     Social Determinants of Health (SDOH) Social History: SDOH Screenings   Food Insecurity: No Food Insecurity (02/09/2023)  Housing: Low Risk  (02/09/2023)  Transportation Needs: No Transportation Needs (02/09/2023)  Utilities: Not At Risk (02/09/2023)  Tobacco Use: Medium Risk (02/11/2023)   SDOH Interventions:     Readmission Risk Interventions     02/17/2023   12:36 PM  Readmission Risk Prevention Plan  Transportation Screening Complete  Home Care Screening Complete  Medication Review (RN CM) Complete

## 2023-02-17 NOTE — Progress Notes (Signed)
Hospitalist made aware of critical lab WBC 52.4, no new orders.

## 2023-02-17 NOTE — Progress Notes (Signed)
PROGRESS NOTE   Christine Poole  LOV:[id number] DOB: 1951-02-20 DOA: 02/08/2023 PCP: Renaye Rakers, MD   Chief Complaint  Patient presents with   Emesis   Weakness   Level of care: Telemetry  Brief Admission History:  72 y.o. female with medical history significant of arthritis, glaucoma, hypothyroidism, hypertension, hyperlipidemia, and more presents to the ED with a chief complaint of generalized weakness.  Patient reports she started feeling ill about 1 month ago.  The symptoms have waxed and waned.  She reports she is lost about 23 pounds in that 1 month.  Her main complaint is generalized weakness.  She used to be able to ambulate independently.  Recently she has been counter/furniture surfing because she cannot walk without holding onto something.  She reports increased shortness of breath.  The shortness of breath has been constant unlike the other symptoms which have eased off and then come back several times.  She reports she has a cough.  Cough hurts her right shoulder.  She describes it as a sharp pain. Patient complains of early satiety and decreased po intake.  She denies any dysphagia or odynophagia.  She is a former smoker, but quit many years ago. At arrival she had slurred speech that she attributes to her generalized weakness, but code stroke was called.  In ED, pt speech off intermittently, no other significant neuro deficit. Code stroke activated. CT no acute finding. On exam, pt has intermittent stuttering pattern speech, distractable. MR brain was negative.  Neurology, Dr. Jerel Shepherd, saw the patient and felt Pt speech symptoms with stuttering speech more likely functional.   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  There is some compression of the SVC.  There is also an infiltrative mass involving the right inferior  scapula. 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  The patient underwent percutaneous biopsy of her right scapula on 02/11/2023. Her hospital stay was prolonged secondary to development of abdominal pain on 02/12/2023 for which CT of the abdomen and pelvis revealed free air. The patient was taken to the operating room for exploratory laparotomy.  She was found to have small bowel perforation.  She underwent small bowel resection and primary anastomosis with Dr. Algis Greenhouse.   Assessment and Plan:  Sepsis -present on admission -presented with fever and tachycardia -PCT 1.05 -due to post-obstructive pneumonia -initially on zosyn -10/8--add vanco -started IVF -lactic acid 1.5 -10/1 blood cultures neg to date   Obstructive pneumonia -02/08/23 CTA chest--5x7 cm RUL mass extending into R-hilum with complete obstruction of RUL bronchus and compression of SVC;   mass effect into R-pulm artery suggesting tumor thrombus;  infiltrative mass of R-inferior scapula -continue zosyn -stable on RA   Small bowel perforation -02/12/2023 CT abdomen noted pneumoperitoneum -02/12/2023--exploratory laparotomy with small bowel resection and primary anastomosis -Continue Zosyn>>follow intra-op culture -Postoperative care per Dr. Henreitta Leber -Added fluconazole 10/5 -increase activity -remains npo with NG until return of bowel function   Lung massses -CTA chest neg for PE;  results as discussed above -case discussed with med/onc, Dr. Dollene Primrose IR biopsy of scapula unless IR determines there is an easier target -10/3--IR biopsy of scapular area -10/3--discussed with Dr. Tawny Asal will arrange outpt follow next week -10/2 CT abd/pelvis--extensive LN porta hepatis/peripancreatic/retroperitoneal;  R-adrenal  mass; bilateral perirenal space nodules   Leukocytosis suspect Leukemoid  reaction -felt to be reactive due metastatic cancer, obstructive pneumonia -differential without any concerning WBC precursors -remains afebrile and hemodynamically stable -repeat CBC with diff--no concerning WBC precursors -discussed with Dr. Henreitta Leber, plan to repeat CT chest/abd rule out any new process, including abscess, worsening pneumonia, etc.    Sinus tachycardia -personally reviewed EKG--sinus tach, nonspecific T-wave changes -CTA chest--negative PE -As the patient is n.p.o., switch diltiazem to IV Lopressor -was previously tachycardic 130-140 on cardizem/metoprolol>>overall improving   Hyperlipidemia -Holding statin until able to take p.o.   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   Severe malnutrition -may need TPN if no return of bowel function    DVT prophylaxis: Wake heparin  Code Status: full  Family Communication:  Disposition:  anticipate home  Consultants:  IR radiology Surgery Henreitta Leber) [place] (oncology)  Procedures:  Biopsy scapula mass Antimicrobials:   Zosyn>>  Subjective: Overall with ongoing malaise and fatigue and NG tube fatigue/irritation  Objective: Vitals:   02/16/23 2027 02/17/23 0014 02/17/23 0420 02/17/23 1200  BP: (!) 144/76 (!) 141/80 134/83 (!) 147/87  Pulse: 99 (!) 106 (!) 109 (!) 117  Resp: 20  20 20   Temp: 98.4 F (36.9 C)  98 F (36.7 C) 98.1 F (36.7 C)  TempSrc:    Oral  SpO2: 100%  97% 97%  Weight:      Height:        Intake/Output Summary (Last 24 hours) at 02/17/2023 1427 Last data filed at  02/17/2023 1422 Gross per 24 hour  Intake 1479.41 ml  Output 1625 ml  Net -145.59 ml   Filed Weights   02/08/23 1618 02/08/23 2056  Weight: 83 kg 74.2 kg   Examination:  General exam: Appears thin, chronically ill, NG in place.   Respiratory system: diminished BS RLL. Respiratory effort normal. Cardiovascular system: normal S1 & S2 heard. No JVD, murmurs, rubs, gallops or clicks. No pedal edema. Gastrointestinal system: Abdomen is nondistended, soft and nontender. No organomegaly or masses felt. Normal bowel sounds heard. Central nervous system: Alert and oriented. No focal neurological deficits. Extremities: Symmetric 5 x 5 power. Skin: No rashes, lesions or ulcers. Psychiatry: Judgement and insight appear normal. Mood & affect appropriate.   Data Reviewed: I have personally reviewed following labs and imaging studies  CBC: Recent Labs  Lab 02/13/23 0621 02/14/23 0439 02/15/23 0442 02/16/23 0425 02/17/23 0407  WBC 44.7* 44.7* 46.1* 50.9* 52.4*  NEUTROABS 42.0*  --  42.0* 42.2* 47.7*  HGB 9.3* 7.9* 8.1* 8.2* 8.2*  HCT 29.8* 25.0* 26.1* 26.9* 25.8*  MCV 83.0 82.8 82.6 82.5 82.2  PLT 373 356 352 311 152    Basic Metabolic Panel: Recent Labs  Lab 02/13/23 0621 02/14/23 0439 02/15/23 0442 02/16/23 0425 02/17/23 0407  NA 134* 137 135 133* 133*  K 3.5 3.2* 3.1* 3.4* 3.1*  CL 103 104 100 98 98  CO2 21* 25 25 23  20*  GLUCOSE 150* 112* 101* 64* 73  BUN 11 17 14 8  6*  CREATININE 0.64 0.58 0.58 0.60 0.62  CALCIUM 8.4* 8.2* 8.4* 8.4*  8.2*  MG 1.9 2.1 2.0 2.2 2.0  PHOS  --  2.3* 2.4*  --  2.7    CBG: Recent Labs  Lab 02/16/23 0627 02/16/23 2033  GLUCAP 77 100*    Recent Results (from the past 240 hour(s))  Culture, blood (Routine X 2) w Reflex to ID Panel     Status: None   Collection Time: 02/09/23  5:05 AM   Specimen: BLOOD LEFT HAND  Result Value Ref Range Status   Specimen Description BLOOD LEFT HAND  Final   Special Requests   Final    BOTTLES DRAWN  AEROBIC AND ANAEROBIC Blood Culture adequate volume   Culture   Final    NO GROWTH 5 DAYS Performed at [facility], [address], [place], [place] [address]    Report Status 02/14/2023 FINAL  Final  Culture, blood (Routine X 2) w Reflex to ID Panel     Status: None   Collection Time: 02/09/23  5:07 AM   Specimen: BLOOD  Result Value Ref Range Status   Specimen Description BLOOD BLOOD LEFT HAND  Final   Special Requests   Final    BOTTLES DRAWN AEROBIC AND ANAEROBIC Blood Culture adequate volume   Culture   Final    NO GROWTH 5 DAYS Performed at [facility], [address] [place], [place] [address]    Report Status 02/14/2023 FINAL  Final  Aerobic/Anaerobic Culture w Gram Stain (surgical/deep wound)     Status: None (Preliminary result)   Collection Time: 02/12/23  6:25 PM   Specimen: Path fluid; Body Fluid  Result Value Ref Range Status   Specimen Description   Final    FLUID Performed at [facility], [address] [place], [place] [address]    Special Requests   Final    NONE Performed at [facility], [address], [place], [place] [address]    Gram Stain   Final    MODERATE WBC PRESENT, PREDOMINANTLY PMN RARE BUDDING YEAST SEEN RARE GRAM POSITIVE RODS Performed at [facility], [address], [place], [place] [address]    Culture   Final    CULTURE REINCUBATED FOR BETTER GROWTH NO ANAEROBES ISOLATED; CULTURE IN PROGRESS FOR 5 DAYS    Report Status PENDING  Incomplete  MRSA Next Gen by PCR, Nasal     Status: None   Collection Time: 02/16/23  8:16 AM   Specimen: Nasal Mucosa; Nasal Swab  Result Value Ref Range Status   MRSA by PCR Next Gen NOT DETECTED NOT DETECTED Final    Comment: (NOTE) The GeneXpert MRSA Assay (FDA approved for NASAL specimens only), is one component of a comprehensive MRSA colonization surveillance program. It is not intended to diagnose MRSA infection nor to guide or monitor treatment for MRSA infections. Test performance is not  FDA approved in patients less than 69 years old. Performed at [facility], [address] [place], [place] [address]      Radiology Studies: No results found.  Scheduled Meds:  Chlorhexidine Gluconate Cloth  6 each Topical Daily   heparin  5,000 Units Subcutaneous Q8H   levothyroxine  25 mcg Per Tube Q0600   metoprolol tartrate  2.5 mg Intravenous Q6H   Continuous Infusions:  fluconazole (DIFLUCAN) IV 400 mg (02/17/23 0955)   lactated ringers 100 mL/hr at 02/17/23 0536   piperacillin-tazobactam (ZOSYN)  IV 3.375 g (02/17/23 0537)   potassium chloride 10 mEq (02/17/23 1244)   vancomycin 1,250 mg (02/17/23 1002)  LOS: 9 days   Time spent: 51 mins  Jasmin Winberry Laural Benes, MD How to contact the [facility] Attending or Consulting provider 7A - 7P or covering provider during after hours 7P -7A, for this patient?  Check the care team in [facility] and look for a) attending/consulting [facility] provider listed and b) the [facility] team listed Log into [email or web address] to find provider on call.  Locate the [facility] provider you are looking for under [facility] and page to a number that you can be directly reached. If you still have difficulty reaching the provider, please page the [facility] (Director on Call) for the Hospitalists listed on amion for assistance.  02/17/2023, 2:27 PM

## 2023-02-17 NOTE — Progress Notes (Signed)
Abd wound dressing changed. Wound bed clean, pink, no drainage or odor. Repacked with saline soaked gauze, covered with abd pads and secured with tape. Pt tolerated well. Bladder scan performed on pt prior to ambulation to bathroom, showed 151 ml. Pt voided 150 ml clear yellow urine in nuns cap, post void scan revealed < 30 ml in bladder. Oral contrast administered via NGT for planned CT at noon today. Pt with no complaints at present.

## 2023-02-18 ENCOUNTER — Inpatient Hospital Stay: Payer: BC Managed Care – PPO | Admitting: Hematology

## 2023-02-18 DIAGNOSIS — K631 Perforation of intestine (nontraumatic): Secondary | ICD-10-CM | POA: Diagnosis not present

## 2023-02-18 DIAGNOSIS — J181 Lobar pneumonia, unspecified organism: Secondary | ICD-10-CM | POA: Diagnosis not present

## 2023-02-18 DIAGNOSIS — R918 Other nonspecific abnormal finding of lung field: Secondary | ICD-10-CM | POA: Diagnosis not present

## 2023-02-18 DIAGNOSIS — J189 Pneumonia, unspecified organism: Secondary | ICD-10-CM | POA: Diagnosis not present

## 2023-02-18 LAB — CBC WITH DIFFERENTIAL/PLATELET
Abs Immature Granulocytes: 2.14 10*3/uL — ABNORMAL HIGH (ref 0.00–0.07)
Basophils Absolute: 0.2 10*3/uL — ABNORMAL HIGH (ref 0.0–0.1)
Basophils Relative: 0 %
Eosinophils Absolute: 0.1 10*3/uL (ref 0.0–0.5)
Eosinophils Relative: 0 %
HCT: 24.5 % — ABNORMAL LOW (ref 36.0–46.0)
Hemoglobin: 8.1 g/dL — ABNORMAL LOW (ref 12.0–15.0)
Immature Granulocytes: 4 %
Lymphocytes Relative: 6 %
Lymphs Abs: 3.1 10*3/uL (ref 0.7–4.0)
MCH: 26.2 pg (ref 26.0–34.0)
MCHC: 33.1 g/dL (ref 30.0–36.0)
MCV: 79.3 fL — ABNORMAL LOW (ref 80.0–100.0)
Monocytes Absolute: 1.6 10*3/uL — ABNORMAL HIGH (ref 0.1–1.0)
Monocytes Relative: 3 %
Neutro Abs: 41.1 10*3/uL — ABNORMAL HIGH (ref 1.7–7.7)
Neutrophils Relative %: 87 %
Platelets: 92 10*3/uL — ABNORMAL LOW (ref 150–400)
RBC: 3.09 MIL/uL — ABNORMAL LOW (ref 3.87–5.11)
RDW: 18 % — ABNORMAL HIGH (ref 11.5–15.5)
WBC: 48.1 10*3/uL — ABNORMAL HIGH (ref 4.0–10.5)
nRBC: 0.1 % (ref 0.0–0.2)

## 2023-02-18 LAB — COMPREHENSIVE METABOLIC PANEL
ALT: 15 U/L (ref 0–44)
AST: 21 U/L (ref 15–41)
Albumin: 1.9 g/dL — ABNORMAL LOW (ref 3.5–5.0)
Alkaline Phosphatase: 177 U/L — ABNORMAL HIGH (ref 38–126)
Anion gap: 10 (ref 5–15)
BUN: 5 mg/dL — ABNORMAL LOW (ref 8–23)
CO2: 22 mmol/L (ref 22–32)
Calcium: 8.1 mg/dL — ABNORMAL LOW (ref 8.9–10.3)
Chloride: 100 mmol/L (ref 98–111)
Creatinine, Ser: 0.51 mg/dL (ref 0.44–1.00)
GFR, Estimated: >60 mL/min (ref >60)
Glucose, Bld: 128 mg/dL — ABNORMAL HIGH (ref 70–99)
Potassium: 2.8 mmol/L — ABNORMAL LOW (ref 3.5–5.1)
Sodium: 132 mmol/L — ABNORMAL LOW (ref 135–145)
Total Bilirubin: 0.7 mg/dL (ref 0.3–1.2)
Total Protein: 5.8 g/dL — ABNORMAL LOW (ref 6.5–8.1)

## 2023-02-18 LAB — MAGNESIUM: Magnesium: 2 mg/dL (ref 1.7–2.4)

## 2023-02-18 LAB — SURGICAL PATHOLOGY

## 2023-02-18 LAB — PROCALCITONIN: Procalcitonin: 0.82 ng/mL

## 2023-02-18 MED ORDER — ACETAMINOPHEN 325 MG PO TABS: 650.0000 mg | ORAL_TABLET | Freq: Four times a day (QID) | ORAL | Status: DC | PRN

## 2023-02-18 MED ORDER — LEVOTHYROXINE SODIUM 25 MCG PO TABS
25.0000 ug | ORAL_TABLET | Freq: Every day | ORAL | Status: DC
  Administered 2023-02-19 – 2023-02-20 (×2): 25 ug via ORAL
  Filled 2023-02-18 (×2): qty 1

## 2023-02-18 MED ORDER — ONDANSETRON HCL 4 MG PO TABS: 4.0000 mg | ORAL_TABLET | Freq: Four times a day (QID) | ORAL | Status: DC | PRN

## 2023-02-18 MED ORDER — ONDANSETRON HCL 4 MG/2ML IJ SOLN: 4.0000 mg | Freq: Four times a day (QID) | INTRAMUSCULAR | Status: DC | PRN

## 2023-02-18 MED ORDER — OXYCODONE HCL 5 MG PO TABS
5.0000 mg | ORAL_TABLET | ORAL | Status: DC | PRN
  Administered 2023-02-18 – 2023-02-20 (×3): 5 mg via ORAL
  Filled 2023-02-18 (×4): qty 1

## 2023-02-18 MED ORDER — ACETAMINOPHEN 650 MG RE SUPP: 650.0000 mg | Freq: Four times a day (QID) | RECTAL | Status: DC | PRN

## 2023-02-18 MED ORDER — DILTIAZEM HCL 30 MG PO TABS
30.0000 mg | ORAL_TABLET | Freq: Two times a day (BID) | ORAL | Status: DC
  Administered 2023-02-18 – 2023-02-19 (×3): 30 mg via ORAL
  Filled 2023-02-18 (×3): qty 1

## 2023-02-18 MED ORDER — ROSUVASTATIN CALCIUM 20 MG PO TABS
40.0000 mg | ORAL_TABLET | Freq: Every evening | ORAL | Status: DC
  Administered 2023-02-18 – 2023-02-19 (×2): 40 mg via ORAL
  Filled 2023-02-18 (×2): qty 2

## 2023-02-18 MED ORDER — POTASSIUM CHLORIDE CRYS ER 20 MEQ PO TBCR
60.0000 meq | EXTENDED_RELEASE_TABLET | Freq: Once | ORAL | Status: AC
  Administered 2023-02-18: 60 meq via ORAL
  Filled 2023-02-18: qty 3

## 2023-02-18 NOTE — Plan of Care (Signed)

## 2023-02-18 NOTE — Progress Notes (Signed)
[facility]  Having bm and tolerated clears. No nausea. Official read CT with no abscess intraabdominal, they say superficial dehiscence-this is actually the skin opened where it is is being packed. There is NO fascial dehiscence on CT.  Discussed wbc and antibiotic with Dr.Johnson. Per STOP it trial and based on cultures I think we can stop stuff for the abdomen.   BP (!) 134/95 (BP Location: Left Arm)   Pulse (!) 110   Temp 98 F (36.7 C)   Resp 20   Ht 5\' 6"  (1.676 m)   Wt 74.2 kg   SpO2 98%   BMI 26.40 kg/m  Midline dressing in place, soft, appropriately tender  Patient s/p Ex lap SBR for SB perforation. Doing well. PRN for pain IS, OOB Binder Full liquids to soft diet Ok to stop abdominal antimicrobials Discussed with Dr. Laural Benes  Ambulate SB pathology pending  Algis Greenhouse, MD [facility] [address] [place], [place] [address] [phone] (office)

## 2023-02-18 NOTE — Progress Notes (Signed)
Old wound dressing removed. 3 4x4 gauze sponges removed from wound. Wound bed pink, odorless, and absent of drainage. Wound packed with 3 damp 4x4 gauze sponges (One in the top opening, one in the middle opening, and one in the bottom opening). Covered with 2 clean ABD pads and secured with tape. Signed and dated.

## 2023-02-18 NOTE — Progress Notes (Signed)
PROGRESS NOTE   Christine Poole  NWG:[id number] DOB: [birth year] DOA: 02/08/2023 PCP: Renaye Rakers, MD   Chief Complaint  Patient presents with   Emesis   Weakness   Level of care: Telemetry  Brief Admission History:  72 y.o. female with medical history significant of arthritis, glaucoma, hypothyroidism, hypertension, hyperlipidemia, and more presents to the ED with a chief complaint of generalized weakness.  Patient reports she started feeling ill about 1 month ago.  The symptoms have waxed and waned.  She reports she is lost about 23 pounds in that 1 month.  Her main complaint is generalized weakness.  She used to be able to ambulate independently.  Recently she has been counter/furniture surfing because she cannot walk without holding onto something.  She reports increased shortness of breath.  The shortness of breath has been constant unlike the other symptoms which have eased off and then come back several times.  She reports she has a cough.  Cough hurts her right shoulder.  She describes it as a sharp pain. Patient complains of early satiety and decreased po intake.  She denies any dysphagia or odynophagia.  She is a former smoker, but quit many years ago. At arrival she had slurred speech that she attributes to her generalized weakness, but code stroke was called.  In ED, pt speech off intermittently, no other significant neuro deficit. Code stroke activated. CT no acute finding. On exam, pt has intermittent stuttering pattern speech, distractable. MR brain was negative.  Neurology, Dr. Jerel Shepherd, saw the patient and felt Pt speech symptoms with stuttering speech more likely functional.   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  There is some compression of the SVC.  There is also an infiltrative mass involving the right inferior  scapula. 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  The patient underwent percutaneous biopsy of her right scapula on 02/11/2023. Her hospital stay was prolonged secondary to development of abdominal pain on 02/12/2023 for which CT of the abdomen and pelvis revealed free air. The patient was taken to the operating room for exploratory laparotomy.  She was found to have small bowel perforation.  She underwent small bowel resection and primary anastomosis with Dr. Algis Greenhouse.   Assessment and Plan:  Sepsis -present on admission -presented with fever and tachycardia -PCT 1.05 -due to post-obstructive pneumonia -completed IVF and antibiotics -lactic acid 1.5 -10/1 blood cultures neg to date -sepsis physiology resolved now   Obstructive pneumonia -02/08/23 CTA chest--5x7 cm RUL mass extending into R-hilum with complete obstruction of RUL bronchus and compression of SVC;   mass effect into R-pulm artery suggesting tumor thrombus;  infiltrative mass of R-inferior scapula -completed course of zosyn -stable on RA   Small bowel perforation -02/12/2023 CT abdomen noted pneumoperitoneum -02/12/2023--exploratory laparotomy with small bowel resection and primary anastomosis -completed Zosyn>>follow intra-op culture -Postoperative care per Dr. Henreitta Leber -Added fluconazole 10/5 - completed 10/10 -increase activity -advancing diet, NG removed 10/10.    Lung massses -CTA chest neg for PE;  results as discussed above -case discussed with med/onc, Dr. Dollene Primrose IR biopsy of scapula unless IR determines there is an easier target -10/3--IR biopsy of scapular area:  results c/w poor differentiated adenocarcinoma -10/3--discussed with Dr. Tawny Asal will arrange outpt follow next  week -10/2 CT abd/pelvis--extensive LN porta hepatis/peripancreatic/retroperitoneal;  R-adrenal  mass; bilateral perirenal space nodules   Leukocytosis suspect Leukemoid reaction -felt to be reactive due metastatic cancer, obstructive pneumonia -differential without any concerning WBC precursors -remains afebrile and hemodynamically stable -repeat CBC with diff--no concerning WBC precursors -discussed with Dr. Henreitta Leber, repeated CT chest/abd rule out any new process, including abscess, worsening pneumonia, etc. CT neg for new process -asked Dr Ellin Saba to weigh in    Sinus tachycardia -personally reviewed EKG--sinus tach, nonspecific T-wave changes -CTA chest--negative PE -As the patient is n.p.o., switch diltiazem to IV Lopressor -was previously tachycardic 130-140 on cardizem/metoprolol>>overall improving -resume diltiazem 30 mg BID, if BP tolerates, hopefully advance to home dose of 90 mg BID   Hyperlipidemia -resumed home rosuvastatin    Essential HTN -hold lisinopril/hydrochlorothiazide for now due to soft BP -overall BPs improving and remain controlled -IV Lopressor as discussed above   Hypokalemia -repleted po and IV   Hypophophatemia -repleted with K phos   Hypothyroidism following RAI therapy -continue synthroid -TSH 0.630   DM2 with hypoglycemia -allow for liberal glycemic control at this point   Hyponatremia -volume depletion, poor solute intake with 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  She is having BMs.   Objective: Vitals:   02/17/23 1200 02/17/23 1828 02/17/23 2026 02/18/23 0437  BP: (!) 147/87 123/74 132/82 (!) 134/95  Pulse: (!) 117 (!) 108 (!) 110    Resp: 20 19 18 20   Temp: 98.1 F (36.7 C) 98 F (36.7 C) 98.8 F (37.1 C) 98 F (36.7 C)  TempSrc: Oral  Oral   SpO2: 97% 97% 97% 98%  Weight:      Height:        Intake/Output Summary (Last 24 hours) at 02/18/2023 1241 Last data filed at 02/18/2023 0933 Gross per 24 hour  Intake 840 ml  Output 1800 ml  Net -960 ml   Filed Weights   02/08/23 1618 02/08/23 2056  Weight: 83 kg 74.2 kg   Examination:  General exam: Appears thin, chronically ill, NAD, ambulating in room.    Respiratory system: diminished BS RLL. Respiratory effort normal. Cardiovascular system: normal S1 & S2 heard. No JVD, murmurs, rubs, gallops or clicks. No pedal edema. Gastrointestinal system: Abdomen is nondistended, soft and nontender. No organomegaly or masses felt. Normal bowel sounds heard. Central nervous system: Alert and oriented. No focal neurological deficits. Extremities: Symmetric 5 x 5 power. Skin: No rashes, lesions or ulcers. Psychiatry: Judgement and insight appear normal. Mood & affect appropriate.   Data Reviewed: I have personally reviewed following labs and imaging studies  CBC: Recent Labs  Lab 02/13/23 0621 02/14/23 0439 02/15/23 0442 02/16/23 0425 02/17/23 0407 02/18/23 0412  WBC 44.7* 44.7* 46.1* 50.9* 52.4* 48.1*  NEUTROABS 42.0*  --  42.0* 42.2* 47.7* 41.1*  HGB 9.3* 7.9* 8.1* 8.2* 8.2* 8.1*  HCT 29.8* 25.0* 26.1* 26.9* 25.8* 24.5*  MCV 83.0 82.8 82.6 82.5 82.2 79.3*  PLT 373 356 352 311 152 92*    Basic Metabolic Panel: Recent Labs  Lab 02/14/23 0439 02/15/23 0442 02/16/23 0425 02/17/23 0407 02/18/23 0412  NA 137 135  133* 133* 132*  K 3.2* 3.1* 3.4* 3.1* 2.8*  CL 104 100 98 98 100  CO2 25 25 23  20* 22  GLUCOSE 112* 101* 64* 73 128*  BUN 17 14 8  6* 5*  CREATININE 0.58 0.58 0.60 0.62 0.51  CALCIUM 8.2* 8.4* 8.4* 8.2* 8.1*  MG 2.1 2.0 2.2 2.0 2.0  PHOS 2.3* 2.4*  --  2.7  --     CBG: Recent Labs  Lab 02/16/23 0627 02/16/23 2033  GLUCAP 77 100*     Recent Results (from the past 240 hour(s))  Culture, blood (Routine X 2) w Reflex to ID Panel     Status: None   Collection Time: 02/09/23  5:05 AM   Specimen: BLOOD LEFT HAND  Result Value Ref Range Status   Specimen Description BLOOD LEFT HAND  Final   Special Requests   Final    BOTTLES DRAWN AEROBIC AND ANAEROBIC Blood Culture adequate volume   Culture   Final    NO GROWTH 5 DAYS Performed at [facility], [address] [place], [place] [address]    Report Status 02/14/2023 FINAL  Final  Culture, blood (Routine X 2) w Reflex to ID Panel     Status: None   Collection Time: 02/09/23  5:07 AM   Specimen: BLOOD  Result Value Ref Range Status   Specimen Description BLOOD BLOOD LEFT HAND  Final   Special Requests   Final    BOTTLES DRAWN AEROBIC AND ANAEROBIC Blood Culture adequate volume   Culture   Final    NO GROWTH 5 DAYS Performed at [facility], [address], [place], [place] [address]    Report Status 02/14/2023 FINAL  Final  Aerobic/Anaerobic Culture w Gram Stain (surgical/deep wound)     Status: None (Preliminary result)   Collection Time: 02/12/23  6:25 PM   Specimen: Path fluid; Body Fluid  Result Value Ref Range Status   Specimen Description   Final    FLUID Performed at [facility], [address], [place], [place] [address]    Special Requests   Final    NONE Performed at [facility], [address], [place], [place] [address]    Gram Stain   Final    MODERATE WBC PRESENT, PREDOMINANTLY PMN RARE BUDDING YEAST SEEN RARE GRAM POSITIVE RODS Performed at [facility], [address], [place], [place] [address]    Culture   Final    CULTURE REINCUBATED FOR BETTER GROWTH NO ANAEROBES ISOLATED; CULTURE IN PROGRESS FOR 5 DAYS    Report Status PENDING  Incomplete  MRSA Next Gen by PCR, Nasal     Status: None   Collection Time: 02/16/23  8:16 AM   Specimen: Nasal Mucosa; Nasal Swab  Result Value Ref Range Status   MRSA by PCR Next Gen NOT DETECTED  NOT DETECTED Final    Comment: (NOTE) The GeneXpert MRSA Assay (FDA approved for NASAL specimens only), is one component of a comprehensive MRSA colonization surveillance program. It is not intended to diagnose MRSA infection nor to guide or monitor treatment for MRSA infections. Test performance is not FDA approved in patients less than 23 years old. Performed at [facility], [address], [place], [place] [address]      Radiology Studies: CT CHEST ABDOMEN PELVIS W CONTRAST  Result Date: 02/17/2023 CLINICAL DATA:  Metastatic lung cancer. Progressive leukocytosis. * Tracking Code: BO * EXAM: CT CHEST, ABDOMEN, AND PELVIS WITH CONTRAST TECHNIQUE: Multidetector CT imaging of the  chest, abdomen and pelvis was performed following the standard protocol during bolus administration of intravenous contrast. RADIATION DOSE REDUCTION: This exam was performed according to the departmental dose-optimization program which includes automated exposure control, adjustment of the mA and/or kV according to patient size and/or use of iterative reconstruction technique. CONTRAST:  OMNIPAQUE IOHEXOL 300 MG/ML  SOLN COMPARISON:  CT chest 02/12/2023, CT abdomen pelvis 02/10/2023 FINDINGS: CT CHEST FINDINGS Cardiovascular: Moderate multi-vessel coronary artery calcification. Global cardiac size is within normal limits. No pericardial effusion. Bulky right hilar and right paratracheal adenopathy demonstrates intravascular nodular extension into the superior vena cava, best seen on image # 20/2 and 82/4 and right pulmonary artery, image # 24/2 and 95/4. This appears new since prior examination. Additionally, mass effect versus invasion of the superior vena cava related to right hilar adenopathy obliterates the azygous arch and demonstrates marked narrowing of the superior vena cava, best seen on image # 25/2 and progressive since prior examination. Left upper lobar pulmonary artery is obliterated. The thoracic aorta is  unremarkable. Mediastinum/Nodes: Bulky mediastinal and right hilar adenopathy appears grossly stable. Nasogastric tube extends into the mid body of the stomach. Esophagus unremarkable. Multiple thyroid nodules again identified, better assessed on sonogram of 06/17/2020 Lungs/Pleura: There is dense multifocal nodular consolidation within the right upper lobe. Superimposed ground-glass and airspace infiltrate peripherally within the right upper lobe is again seen in keeping with postobstructive pneumonitis or, in the appropriate clinical setting, radiation pneumonitis. Obliteration of the a right upper lobe are pulmonary bronchus again noted. Small right pleural effusion is present, slightly enlarged since prior examination. Trace left pleural effusion is present, new since prior examination. No new focal pulmonary infiltrates. No pneumothorax. Musculoskeletal: No acute bone abnormality. No lytic or blastic bone lesion. CT ABDOMEN PELVIS FINDINGS Hepatobiliary: Liver is unremarkable. Gallbladder unremarkable. No intra or extrahepatic biliary ductal dilation. Pancreas: Pathologic aortocaval and periportal adenopathy is again identified resulting mass effect upon the pancreatic head. The pancreas is otherwise unremarkable. Spleen: Normal in size without focal abnormality. 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 Left adrenal gland is unremarkable. The kidneys are normal in size and position. Multiple pararenal soft tissue nodules are again seen bilaterally suspicious for soft tissue metastases, similar to prior examination. No hydronephrosis. The bladder is unremarkable. Stomach/Bowel: Interval small-bowel resection with anastomotic staple line noted within the mid pelvis. No evidence of obstruction. No free intraperitoneal gas. Trace free fluid within the pelvis. Appendix normal.  Vascular/Lymphatic: Extensive aortoiliac atherosclerotic calcification. No aortic aneurysm. Pathologic periportal, peripancreatic/pericaval, and mesenteric adenopathy is again identified and appears stable. No new pathologic adenopathy. Reproductive: Urethral diverticulum again noted. Uterus absent. Interval development of a a loculated 2.4 x 2.8 cm para ovarian collection abutting the left ovary, possibly postsurgical in nature. Right ovary is unremarkable. Other: Superficial dehiscence of the laparotomy incision. Small fat containing inguinal hernia. Musculoskeletal: No acute bone abnormality. No lytic or blastic bone lesion. IMPRESSION: 1. Interval small-bowel resection with anastomotic staple line within the mid pelvis. No evidence of obstruction. 2. Interval development of intravascular nodular extension of the right hilar and right paratracheal adenopathy into the superior vena cava and right pulmonary artery. Progressive mass effect versus invasion of the superior vena cava related to right hilar adenopathy obliterates the azygous arch and demonstrates marked narrowing of the superior vena cava, progressive since prior examination. 3. Multifocal nodular consolidation within the right upper lobe with  superimposed ground-glass and airspace infiltrate in keeping with postobstructive pneumonitis or, in the appropriate clinical setting, radiation pneumonitis. 4. Small right pleural effusion, slightly enlarged since prior examination. Trace left pleural effusion, new since prior examination. 5. 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 6. Multiple pararenal soft tissue nodules bilaterally suspicious for soft tissue metastases, similar to prior examination. 7. Pathologic periportal, peripancreatic/pericaval, and mesenteric adenopathy, similar to prior examination. 8. Interval development of a loculated 2.4 x 2.8 cm collection abutting the left ovary, possibly postsurgical in nature. 9. Superficial  dehiscence of the laparotomy incision. Electronically Signed   By: Helyn Numbers M.D.   On: 02/17/2023 18:26    Scheduled Meds:  Chlorhexidine Gluconate Cloth  6 each Topical Daily   feeding supplement  1 Container Oral TID BM   heparin  5,000 Units Subcutaneous Q8H   [START ON 02/19/2023] levothyroxine  25 mcg Oral Q0600   metoprolol tartrate  2.5 mg Intravenous Q6H   Continuous Infusions:  piperacillin-tazobactam (ZOSYN)  IV 3.375 g (02/18/23 0609)    LOS: 10 days   Time spent: 57 mins  Lakeasha Petion Laural Benes, MD How to contact the [facility] Attending or Consulting provider 7A - 7P or covering provider during after hours 7P -7A, for this patient?  Check the care team in [facility] and look for a) attending/consulting [facility] provider listed and b) the [facility] team listed Log into [email or web address] to find provider on call.  Locate the [facility] provider you are looking for under [facility] and page to a number that you can be directly reached. If you still have difficulty reaching the provider, please page the [facility] (Director on Call) for the Hospitalists listed on amion for assistance.  02/18/2023, 12:41 PM

## 2023-02-18 NOTE — Progress Notes (Signed)
Completed FMLA papers where signed by attending, sec Robin made copy, put in file and personally handed originals to pt

## 2023-02-19 ENCOUNTER — Inpatient Hospital Stay (HOSPITAL_COMMUNITY): Payer: BC Managed Care – PPO

## 2023-02-19 DIAGNOSIS — J189 Pneumonia, unspecified organism: Secondary | ICD-10-CM | POA: Diagnosis not present

## 2023-02-19 DIAGNOSIS — R918 Other nonspecific abnormal finding of lung field: Secondary | ICD-10-CM | POA: Diagnosis not present

## 2023-02-19 DIAGNOSIS — J181 Lobar pneumonia, unspecified organism: Secondary | ICD-10-CM | POA: Diagnosis not present

## 2023-02-19 DIAGNOSIS — K631 Perforation of intestine (nontraumatic): Secondary | ICD-10-CM | POA: Diagnosis not present

## 2023-02-19 LAB — BASIC METABOLIC PANEL
Anion gap: 11 (ref 5–15)
BUN: <5 mg/dL — ABNORMAL LOW (ref 8–23)
CO2: 21 mmol/L — ABNORMAL LOW (ref 22–32)
Calcium: 8.3 mg/dL — ABNORMAL LOW (ref 8.9–10.3)
Chloride: 102 mmol/L (ref 98–111)
Creatinine, Ser: 0.57 mg/dL (ref 0.44–1.00)
GFR, Estimated: >60 mL/min (ref >60)
Glucose, Bld: 123 mg/dL — ABNORMAL HIGH (ref 70–99)
Potassium: 3.2 mmol/L — ABNORMAL LOW (ref 3.5–5.1)
Sodium: 134 mmol/L — ABNORMAL LOW (ref 135–145)

## 2023-02-19 MED ORDER — DILTIAZEM HCL 60 MG PO TABS
90.0000 mg | ORAL_TABLET | Freq: Two times a day (BID) | ORAL | Status: DC
  Administered 2023-02-19 – 2023-02-20 (×2): 90 mg via ORAL
  Filled 2023-02-19 (×2): qty 1

## 2023-02-19 MED ORDER — ENSURE ENLIVE PO LIQD
237.0000 mL | Freq: Three times a day (TID) | ORAL | Status: DC
  Administered 2023-02-19 – 2023-02-21 (×4): 237 mL via ORAL

## 2023-02-19 MED ORDER — POTASSIUM CHLORIDE CRYS ER 20 MEQ PO TBCR
40.0000 meq | EXTENDED_RELEASE_TABLET | Freq: Once | ORAL | Status: AC
  Administered 2023-02-19: 40 meq via ORAL
  Filled 2023-02-19: qty 2

## 2023-02-19 MED ORDER — ADULT MULTIVITAMIN W/MINERALS CH
1.0000 | ORAL_TABLET | Freq: Every day | ORAL | Status: DC
  Administered 2023-02-19 – 2023-02-20 (×2): 1 via ORAL
  Filled 2023-02-19 (×2): qty 1

## 2023-02-19 NOTE — Progress Notes (Addendum)
PROGRESS NOTE   Christine Poole  CZY:[id number] DOB: Aug 17, 1950 DOA: 02/08/2023 PCP: Renaye Rakers, MD   Chief Complaint  Patient presents with   Emesis   Weakness   Level of care: Telemetry  Brief Admission History:  72 y.o. female with medical history significant of arthritis, glaucoma, hypothyroidism, hypertension, hyperlipidemia, and more presents to the ED with a chief complaint of generalized weakness.  Patient reports she started feeling ill about 1 month ago.  The symptoms have waxed and waned.  She reports she is lost about 23 pounds in that 1 month.  Her main complaint is generalized weakness.  She used to be able to ambulate independently.  Recently she has been counter/furniture surfing because she cannot walk without holding onto something.  She reports increased shortness of breath.  The shortness of breath has been constant unlike the other symptoms which have eased off and then come back several times.  She reports she has a cough.  Cough hurts her right shoulder.  She describes it as a sharp pain. Patient complains of early satiety and decreased po intake.  She denies any dysphagia or odynophagia.  She is a former smoker, but quit many years ago. At arrival she had slurred speech that she attributes to her generalized weakness, but code stroke was called.  In ED, pt speech off intermittently, no other significant neuro deficit. Code stroke activated. CT no acute finding. On exam, pt has intermittent stuttering pattern speech, distractable. MR brain was negative.  Neurology, Dr. Jerel Shepherd, saw the patient and felt Pt speech symptoms with stuttering speech more likely functional.   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  There is some compression of the SVC.  There is also an infiltrative mass involving the right inferior  scapula. 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  The patient underwent percutaneous biopsy of her right scapula on 02/11/2023. Her hospital stay was prolonged secondary to development of abdominal pain on 02/12/2023 for which CT of the abdomen and pelvis revealed free air. The patient was taken to the operating room for exploratory laparotomy.  She was found to have small bowel perforation.  She underwent small bowel resection and primary anastomosis with Dr. Algis Greenhouse.   Assessment and Plan:  Sepsis -present on admission -presented with fever and tachycardia -PCT 1.05 -due to post-obstructive pneumonia -completed IVF and antibiotics -lactic acid 1.5 -10/1 blood cultures neg to date   Obstructive pneumonia -02/08/23 CTA chest--5x7 cm RUL mass extending into R-hilum with complete obstruction of RUL bronchus and compression of SVC;   mass effect into R-pulm artery suggesting tumor thrombus;  infiltrative mass of R-inferior scapula -treated with IV zosyn -stable on RA   Small bowel perforation -02/12/2023 CT abdomen noted pneumoperitoneum -02/12/2023--exploratory laparotomy with small bowel resection and primary anastomosis -completed Zosyn>>follow intra-op culture -Postoperative care per Dr. Henreitta Leber -Added fluconazole 10/5 - completed 10/10 -increase activity -advancing diet, NG removed 10/10.    Lung massses -CTA chest neg for PE;  results as discussed above -case discussed with med/onc, Dr. Dollene Primrose IR biopsy of scapula unless IR determines there is an easier target -10/3--IR biopsy of scapular area:  results c/w poor differentiated adenocarcinoma -10/3--discussed with Dr. Tawny Asal will arrange outpt follow next week -10/2 CT abd/pelvis--extensive  LN porta hepatis/peripancreatic/retroperitoneal;  R-adrenal mass; bilateral perirenal space  nodules   Leukocytosis suspect Leukemoid reaction -felt to be reactive due metastatic cancer, obstructive pneumonia -differential without any concerning WBC precursors -remains afebrile and hemodynamically stable -repeat CBC with diff--no concerning WBC precursors -discussed with Dr. Henreitta Leber, repeated CT chest/abd rule out any new process, including abscess, worsening pneumonia, etc. CT neg for new process -asked Dr Ellin Saba to weigh in, says likely due to underlying cancer    Sinus tachycardia -personally reviewed EKG--sinus tach, nonspecific T-wave changes -CTA chest--negative PE -was previously tachycardic 130-140 on cardizem/metoprolol>>overall improving -resumed diltiazem 30 mg BID, if BP tolerates, advanced to home dose of 90 mg BID on 10/11   Hyperlipidemia -resumed home rosuvastatin    Essential HTN -hold lisinopril/hydrochlorothiazide for now due to soft BP -overall BPs improving and remain controlled -IV Lopressor as discussed above   Hypokalemia -repleted po and IV   Hypophophatemia -repleted with K phos   Hypothyroidism following RAI therapy -continue synthroid -TSH 0.630   DM2 with hypoglycemia -allow for liberal glycemic control at this point   Hyponatremia -volume depletion, poor solute intake with 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   Severe malnutrition -diet being slowly advanced   DVT prophylaxis: [place] heparin  Code Status: full  Family Communication:  Disposition:  anticipate home  Consultants:  IR radiology Surgery Henreitta Leber) Ellin Saba (oncology)  Procedures:  Biopsy scapula mass - path c/w poorly differentiated adenocarcinoma  Small bowel pathology c/w adenocarcinoma (could be Meckel's in origin)   Antimicrobials:   Zosyn>>stopped 10/10   Subjective: No bowel movement and feels bloated; still on liquid diet;    Objective: Vitals:   02/19/23 0134 02/19/23 0620 02/19/23 1011 02/19/23 1446  BP: 124/80 122/73 (!) 152/87 136/85  Pulse: (!)  109 (!) 116 (!) 117 (!) 117  Resp:  20 (!) 21 20  Temp:  98.9 F (37.2 C) 98.6 F (37 C) 98.7 F (37.1 C)  TempSrc:   Oral Oral  SpO2: 97% 97% 97% 96%  Weight:      Height:        Intake/Output Summary (Last 24 hours) at 02/19/2023 1710 Last data filed at 02/19/2023 1531 Gross per 24 hour  Intake 932.87 ml  Output --  Net 932.87 ml   Filed Weights   02/08/23 1618 02/08/23 2056  Weight: 83 kg 74.2 kg   Examination:  General exam: Appears thin, chronically ill, NAD, ambulating in room.    Respiratory system: diminished BS RLL. Respiratory effort normal. Cardiovascular system: normal S1 & S2 heard. No JVD, murmurs, rubs, gallops or clicks. No pedal edema. Gastrointestinal system: Abdomen is nondistended, soft and nontender. No organomegaly or masses felt. Normal bowel sounds heard. Central nervous system: Alert and oriented. No focal neurological deficits. Extremities: Symmetric 5 x 5 power. Skin: No rashes, lesions or ulcers. Psychiatry: Judgement and insight appear normal. Mood & affect appropriate.   Data Reviewed: I have personally reviewed following labs and imaging studies  CBC: Recent Labs  Lab 02/13/23 0621 02/14/23 0439 02/15/23 0442 02/16/23 0425 02/17/23 0407 02/18/23 0412  WBC 44.7* 44.7* 46.1* 50.9* 52.4* 48.1*  NEUTROABS 42.0*  --  42.0* 42.2* 47.7* 41.1*  HGB 9.3* 7.9* 8.1* 8.2* 8.2* 8.1*  HCT 29.8* 25.0* 26.1* 26.9* 25.8* 24.5*  MCV 83.0 82.8 82.6 82.5 82.2 79.3*  PLT 373 356 352 311 152 92*    Basic Metabolic Panel: Recent Labs  Lab 02/14/23 0439 02/15/23 0442 02/16/23 0425 02/17/23 0407 02/18/23 0412 02/19/23 0414  NA  137 135 133* 133* 132* 134*  K 3.2* 3.1* 3.4* 3.1* 2.8* 3.2*  CL 104 100 98 98 100 102  CO2 25 25 23  20* 22 21*  GLUCOSE 112* 101* 64* 73 128* 123*  BUN 17 14 8  6* 5* <5*  CREATININE 0.58 0.58 0.60 0.62 0.51 0.57  CALCIUM 8.2* 8.4* 8.4* 8.2* 8.1* 8.3*  MG 2.1 2.0 2.2 2.0 2.0  --   PHOS 2.3* 2.4*  --  2.7  --   --      CBG: Recent Labs  Lab 02/16/23 0627 02/16/23 2033  GLUCAP 77 100*    Recent Results (from the past 240 hour(s))  Aerobic/Anaerobic Culture w Gram Stain (surgical/deep wound)     Status: None (Preliminary result)   Collection Time: 02/12/23  6:25 PM   Specimen: Path fluid; Body Fluid  Result Value Ref Range Status   Specimen Description   Final    FLUID Performed at [facility], [address], [place], [place] [address]    Special Requests   Final    NONE Performed at [facility], [address], [place], [place] [address]    Gram Stain   Final    MODERATE WBC PRESENT, PREDOMINANTLY PMN RARE BUDDING YEAST SEEN RARE GRAM POSITIVE RODS Performed at [facility], [address], [place], [place] [address]    Culture   Final    CULTURE REINCUBATED FOR BETTER GROWTH RARE ACTINOMYCES NAESLUNDII RARE VEILLONELLA SPECIES    Report Status PENDING  Incomplete  MRSA Next Gen by PCR, Nasal     Status: None   Collection Time: 02/16/23  8:16 AM   Specimen: Nasal Mucosa; Nasal Swab  Result Value Ref Range Status   MRSA by PCR Next Gen NOT DETECTED NOT DETECTED Final    Comment: (NOTE) The GeneXpert MRSA Assay (FDA approved for NASAL specimens only), is one component of a comprehensive MRSA colonization surveillance program. It is not intended to diagnose MRSA infection nor to guide or monitor treatment for MRSA infections. Test performance is not FDA approved in patients less than 67 years old. Performed at [facility], [address], [place], [place] [address]      Radiology Studies: DG Abd 1 View  Result Date: 02/19/2023 CLINICAL DATA:  Abdominal distension.  Constipation. EXAM: ABDOMEN - 1 VIEW COMPARISON:  CT of the abdomen and pelvis 02/17/2023. FINDINGS: Single supine view of the abdomen demonstrates a stable nonobstructive bowel gas pattern. There is a small amount of residual contrast within the right colon. No evidence of significant constipation. Midline  skin staples are noted. There are stable degenerative changes in the spine. IMPRESSION: No evidence of bowel obstruction or significant constipation. Electronically Signed   By: Carey Bullocks M.D.   On: 02/19/2023 14:29    Scheduled Meds:  diltiazem  30 mg Oral Q12H   feeding supplement  237 mL Oral TID BM   heparin  5,000 Units Subcutaneous Q8H   levothyroxine  25 mcg Oral Q0600   multivitamin with minerals  1 tablet Oral Daily   rosuvastatin  40 mg Oral QPM   Continuous Infusions:    LOS: 11 days   Time spent: 48 mins  Isami Mehra Laural Benes, MD How to contact the [facility] Attending or Consulting provider 7A - 7P or covering provider during after hours 7P -7A, for this patient?  Check the care team in [facility] and look for a) attending/consulting [facility] provider listed and b) the [facility] team listed Log into [email or web address]  to find provider on call.  Locate the [facility] provider you are looking for under [facility] and page to a number that you can be directly reached. If you still have difficulty reaching the provider, please page the [facility] (Director on Call) for the Hospitalists listed on amion for assistance.  02/19/2023, 5:10 PM

## 2023-02-19 NOTE — Progress Notes (Signed)
Updated patient on the pathology. Updated Dr. Ellin Saba.

## 2023-02-19 NOTE — Plan of Care (Signed)

## 2023-02-19 NOTE — Progress Notes (Signed)
[facility]  Having nausea this AM. Felt bloated. Had BM yesterday. Updated on pathology.  BP (!) 152/87 (BP Location: Left Arm)   Pulse (!) 117   Temp 98.6 F (37 C) (Oral)   Resp (!) 21   Ht 5\' 6"  (1.676 m)   Wt 74.2 kg   SpO2 97%   BMI 26.40 kg/m  Soft, mildly distended, dressing in place  Patient s/p Ex lap SBR for SB perforation. Doing well. PRN for pain IS, OOB Binder Full liquids holding for now Completed antibiotics for Discussed with Dr. Laural Benes  Ambulate SB pathology with adenocarcinoma, could be meckel's in origin, I discussed with Dr. Fredrik Cove, MD [facility] [address] [place], [place] [address] [phone] (office)

## 2023-02-19 NOTE — Progress Notes (Signed)
Nutrition Follow-up  DOCUMENTATION CODES:   Severe malnutrition in context of acute illness/injury  INTERVENTION:   D/C Boost Breeze. Add Ensure Plus High Protein po TID, each supplement provides 350 kcal and 20 grams of protein. MVI with minerals daily.  NUTRITION DIAGNOSIS:   Severe Malnutrition related to acute illness (new lung cancer) as evidenced by energy intake < or equal to 50% for > or equal to 5 days, moderate muscle depletion, percent weight loss.  Ongoing   GOAL:   Patient will meet greater than or equal to 90% of their needs  Progressing  MONITOR:   PO intake, Supplement acceptance  REASON FOR ASSESSMENT:   Malnutrition Screening Tool    ASSESSMENT:   72 yo female admitted with generalized weakness, sepsis, PNA, new lung cancer. PMH includes glaucoma, HTN, arthritis, HLD, hypothyroidism.  10/4 small bowel resection. 10/9 NG tube removed, diet advanced to clear liquids. 10/10 diet advanced to full liquids.  Meal intakes documented at 50-75%.  Patient reports "I am eating what I can." She does not like the Boost Breeze. She had about half of her grits for breakfast today. She likes Ensure supplements and agreed to switch supplement to Ensure Plus High Protein for more calories and protein than Boost Breeze.   Patient now with stage 2 pressure injuries to bilateral buttocks.   Labs reviewed. Na 134, K 3.2, BUN <5  Medications reviewed and include potassium chloride PO x 1.  Diet Order:   Diet Order             Diet full liquid Fluid consistency: Thin  Diet effective now                   EDUCATION NEEDS:   Not appropriate for education at this time  Skin:  Skin Assessment: Skin Integrity Issues: Skin Integrity Issues:: Stage II Stage II: L buttocks, R buttocks  Last BM:  10/10 type 5  Height:   Ht Readings from Last 1 Encounters:  02/08/23 5\' 6"  (1.676 m)    Weight:   Wt Readings from Last 1 Encounters:  02/08/23 74.2 kg     Ideal Body Weight:  59.1 kg  BMI:  Body mass index is 26.4 kg/m.  Estimated Nutritional Needs:   Kcal:  1800-2000  Protein:  85-100 gm  Fluid:  1.8-2 L   Gabriel Rainwater RD, LDN, CNSC Please refer to Amion for contact information.

## 2023-02-20 ENCOUNTER — Inpatient Hospital Stay (HOSPITAL_COMMUNITY): Payer: BC Managed Care – PPO

## 2023-02-20 ENCOUNTER — Encounter (HOSPITAL_COMMUNITY): Payer: Self-pay | Admitting: Family Medicine

## 2023-02-20 DIAGNOSIS — R918 Other nonspecific abnormal finding of lung field: Secondary | ICD-10-CM | POA: Diagnosis not present

## 2023-02-20 DIAGNOSIS — J181 Lobar pneumonia, unspecified organism: Secondary | ICD-10-CM | POA: Diagnosis not present

## 2023-02-20 DIAGNOSIS — K631 Perforation of intestine (nontraumatic): Secondary | ICD-10-CM | POA: Diagnosis not present

## 2023-02-20 DIAGNOSIS — J189 Pneumonia, unspecified organism: Secondary | ICD-10-CM | POA: Diagnosis not present

## 2023-02-20 LAB — CBC
HCT: 24.7 % — ABNORMAL LOW (ref 36.0–46.0)
Hemoglobin: 7.9 g/dL — ABNORMAL LOW (ref 12.0–15.0)
MCH: 26 pg (ref 26.0–34.0)
MCHC: 32 g/dL (ref 30.0–36.0)
MCV: 81.3 fL (ref 80.0–100.0)
Platelets: 38 10*3/uL — ABNORMAL LOW (ref 150–400)
RBC: 3.04 MIL/uL — ABNORMAL LOW (ref 3.87–5.11)
RDW: 18.9 % — ABNORMAL HIGH (ref 11.5–15.5)
WBC: 55.5 10*3/uL (ref 4.0–10.5)
nRBC: 0.4 % — ABNORMAL HIGH (ref 0.0–0.2)

## 2023-02-20 LAB — COMPREHENSIVE METABOLIC PANEL
ALT: 20 U/L (ref 0–44)
AST: 33 U/L (ref 15–41)
Albumin: 2.1 g/dL — ABNORMAL LOW (ref 3.5–5.0)
Alkaline Phosphatase: 271 U/L — ABNORMAL HIGH (ref 38–126)
Anion gap: 9 (ref 5–15)
BUN: 5 mg/dL — ABNORMAL LOW (ref 8–23)
CO2: 19 mmol/L — ABNORMAL LOW (ref 22–32)
Calcium: 8.3 mg/dL — ABNORMAL LOW (ref 8.9–10.3)
Chloride: 107 mmol/L (ref 98–111)
Creatinine, Ser: 0.58 mg/dL (ref 0.44–1.00)
GFR, Estimated: >60 mL/min (ref >60)
Glucose, Bld: 178 mg/dL — ABNORMAL HIGH (ref 70–99)
Potassium: 3.6 mmol/L (ref 3.5–5.1)
Sodium: 135 mmol/L (ref 135–145)
Total Bilirubin: 1 mg/dL (ref 0.3–1.2)
Total Protein: 6.2 g/dL — ABNORMAL LOW (ref 6.5–8.1)

## 2023-02-20 LAB — LACTIC ACID, PLASMA: Lactic Acid, Venous: 2.9 mmol/L (ref 0.5–1.9)

## 2023-02-20 LAB — AEROBIC/ANAEROBIC CULTURE W GRAM STAIN (SURGICAL/DEEP WOUND)

## 2023-02-20 LAB — GLUCOSE, CAPILLARY: Glucose-Capillary: 166 mg/dL — ABNORMAL HIGH (ref 70–99)

## 2023-02-20 LAB — TROPONIN I (HIGH SENSITIVITY): Troponin I (High Sensitivity): 23 ng/L — ABNORMAL HIGH (ref <18)

## 2023-02-20 LAB — PROCALCITONIN: Procalcitonin: 1.25 ng/mL

## 2023-02-20 MED ORDER — MORPHINE SULFATE (CONCENTRATE) 10 MG/0.5ML PO SOLN
5.0000 mg | ORAL | Status: DC | PRN
  Administered 2023-02-20 – 2023-02-21 (×7): 5 mg via SUBLINGUAL
  Filled 2023-02-20 (×7): qty 0.5

## 2023-02-20 MED ORDER — LORAZEPAM 2 MG/ML IJ SOLN: 1.0000 mg | INTRAMUSCULAR | Status: DC | PRN

## 2023-02-20 MED ORDER — MORPHINE SULFATE (PF) 2 MG/ML IV SOLN
1.0000 mg | INTRAVENOUS | Status: DC | PRN
  Administered 2023-02-20: 2 mg via INTRAVENOUS
  Administered 2023-02-20: 4 mg via INTRAVENOUS
  Filled 2023-02-20: qty 2
  Filled 2023-02-20: qty 1

## 2023-02-20 MED ORDER — PIPERACILLIN-TAZOBACTAM 3.375 G IVPB
3.3750 g | Freq: Three times a day (TID) | INTRAVENOUS | Status: DC
  Administered 2023-02-20 (×2): 3.375 g via INTRAVENOUS
  Filled 2023-02-20 (×2): qty 50

## 2023-02-20 MED ORDER — MORPHINE SULFATE (PF) 2 MG/ML IV SOLN
2.0000 mg | Freq: Once | INTRAVENOUS | Status: AC
  Administered 2023-02-20: 2 mg via INTRAVENOUS

## 2023-02-20 MED ORDER — DIPHENHYDRAMINE HCL 50 MG/ML IJ SOLN: 12.5000 mg | INTRAMUSCULAR | Status: DC | PRN

## 2023-02-20 MED ORDER — LORAZEPAM 2 MG/ML PO CONC: 1.0000 mg | ORAL | Status: DC | PRN

## 2023-02-20 MED ORDER — TEMAZEPAM 7.5 MG PO CAPS: 7.5000 mg | ORAL_CAPSULE | Freq: Every evening | ORAL | Status: DC | PRN

## 2023-02-20 MED ORDER — GLYCOPYRROLATE 0.2 MG/ML IJ SOLN: 0.2000 mg | INTRAMUSCULAR | Status: DC | PRN

## 2023-02-20 MED ORDER — BIOTENE DRY MOUTH MT LIQD: 15.0000 mL | OROMUCOSAL | Status: DC | PRN

## 2023-02-20 MED ORDER — IOHEXOL 300 MG/ML  SOLN
100.0000 mL | Freq: Once | INTRAMUSCULAR | Status: AC | PRN
  Administered 2023-02-20: 100 mL via INTRAVENOUS

## 2023-02-20 MED ORDER — BISACODYL 10 MG RE SUPP: 10.0000 mg | Freq: Every day | RECTAL | Status: DC | PRN

## 2023-02-20 MED ORDER — PROCHLORPERAZINE EDISYLATE 10 MG/2ML IJ SOLN: 10.0000 mg | Freq: Four times a day (QID) | INTRAMUSCULAR | Status: DC | PRN

## 2023-02-20 MED ORDER — POLYVINYL ALCOHOL 1.4 % OP SOLN: 1.0000 [drp] | Freq: Four times a day (QID) | OPHTHALMIC | Status: DC | PRN

## 2023-02-20 MED ORDER — METOPROLOL SUCCINATE ER 25 MG PO TB24
25.0000 mg | ORAL_TABLET | Freq: Every day | ORAL | Status: DC
  Administered 2023-02-20: 25 mg via ORAL
  Filled 2023-02-20: qty 1

## 2023-02-20 MED ORDER — GLYCOPYRROLATE 1 MG PO TABS: 1.0000 mg | ORAL_TABLET | ORAL | Status: DC | PRN

## 2023-02-20 MED ORDER — NYSTATIN 100000 UNIT/GM EX POWD: Freq: Three times a day (TID) | CUTANEOUS | Status: DC | PRN

## 2023-02-20 MED ORDER — METOPROLOL TARTRATE 5 MG/5ML IV SOLN
5.0000 mg | Freq: Once | INTRAVENOUS | Status: AC
  Administered 2023-02-20: 5 mg via INTRAVENOUS

## 2023-02-20 MED ORDER — MORPHINE SULFATE (CONCENTRATE) 10 MG/0.5ML PO SOLN: 5.0000 mg | ORAL | Status: DC | PRN

## 2023-02-20 MED ORDER — LORAZEPAM 1 MG PO TABS
1.0000 mg | ORAL_TABLET | ORAL | Status: DC | PRN
  Administered 2023-02-20 – 2023-02-21 (×4): 1 mg via ORAL
  Filled 2023-02-20 (×5): qty 1

## 2023-02-20 MED ORDER — ONDANSETRON HCL 4 MG/2ML IJ SOLN: 4.0000 mg | Freq: Four times a day (QID) | INTRAMUSCULAR | Status: DC | PRN

## 2023-02-20 MED ORDER — GABAPENTIN 300 MG PO CAPS
300.0000 mg | ORAL_CAPSULE | Freq: Once | ORAL | Status: AC
  Administered 2023-02-20: 300 mg via ORAL
  Filled 2023-02-20: qty 1

## 2023-02-20 MED ORDER — ONDANSETRON 4 MG PO TBDP: 4.0000 mg | ORAL_TABLET | Freq: Four times a day (QID) | ORAL | Status: DC | PRN

## 2023-02-20 NOTE — Progress Notes (Addendum)
PROGRESS NOTE   [name]  Poole:[id number] DOB: Mar 25, 1951 DOA: 02/08/2023 PCP: Renaye Rakers, MD   Chief Complaint  Patient presents with   Emesis   Weakness   Level of care: Med-Surg  Brief Admission History:  72 y.o. female with medical history significant of arthritis, glaucoma, hypothyroidism, hypertension, hyperlipidemia, and more presents to the ED with a chief complaint of generalized weakness.  Patient reports she started feeling ill about 1 month ago.  The symptoms have waxed and waned.  She reports she is lost about 23 pounds in that 1 month.  Her main complaint is generalized weakness.  She used to be able to ambulate independently.  Recently she has been counter/furniture surfing because she cannot walk without holding onto something.  She reports increased shortness of breath.  The shortness of breath has been constant unlike the other symptoms which have eased off and then come back several times.  She reports she has a cough.  Cough hurts her right shoulder.  She describes it as a sharp pain. Patient complains of early satiety and decreased po intake.  She denies any dysphagia or odynophagia.  She is a former smoker, but quit many years ago. At arrival she had slurred speech that she attributes to her generalized weakness, but code stroke was called.  In ED, pt speech off intermittently, no other significant neuro deficit. Code stroke activated. CT no acute finding. On exam, pt has intermittent stuttering pattern speech, distractable. MR brain was negative.  Neurology, Dr. Jerel Shepherd, saw the patient and felt Pt speech symptoms with stuttering speech more likely functional.   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  There is some compression of the SVC.  There is also an infiltrative mass involving the right inferior  scapula. 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  The patient underwent percutaneous biopsy of her right scapula on 02/11/2023.    Her hospital stay was prolonged secondary to development of abdominal pain on 02/12/2023 for which CT of the abdomen and pelvis revealed free air.  The patient was taken to the operating room for exploratory laparotomy.  She was found to have small bowel perforation.  She underwent small bowel resection and primary anastomosis with Dr. Algis Greenhouse.  Pt developed increasing WBC and serial CT scans reveal rapidly progressive tumor involvement, bilateral PE, distal aorta occlusion and SMV clot; Drs. Lamona Curl, Bridges recommending hospice care.  Goals of care meeting 02/20/23 with Dr. Henreitta Leber, patient, son, sister and patient transitioning to full comfort focused care and will pursue residential hospice placement.    Hospital Course:  Sepsis -present on admission -presented with fever and tachycardia -PCT 1.05 -due to post-obstructive pneumonia -completed IVF and antibiotics -lactic acid 1.5 -10/1 blood cultures neg to date   Obstructive pneumonia -02/08/23 CTA chest--5x7 cm RUL mass extending into R-hilum with complete obstruction of RUL bronchus and compression of SVC;   mass effect into R-pulm artery suggesting tumor thrombus;  infiltrative mass of R-inferior scapula -treated with IV zosyn -stable on RA   Small bowel perforation -02/12/2023 CT abdomen noted pneumoperitoneum -02/12/2023--exploratory laparotomy with small bowel resection and primary anastomosis -completed Zosyn>>follow intra-op culture -Postoperative care per Dr. Henreitta Leber -Added fluconazole 10/5 - completed 10/10 -increase activity -advancing diet, NG  removed 10/10.    Lung massses -CTA chest neg for PE;  results as discussed above -case discussed with med/onc,  Dr. Dollene Primrose IR biopsy of scapula unless IR determines there is an easier target -10/3--IR biopsy of scapular area:  results c/w poor differentiated adenocarcinoma -10/3--discussed with Dr. Tawny Asal will arrange outpt follow next week -10/2 CT abd/pelvis--extensive LN porta hepatis/peripancreatic/retroperitoneal;  R-adrenal mass; bilateral perirenal space nodules   Leukocytosis suspect Leukemoid reaction -felt to be reactive due metastatic cancer, obstructive pneumonia -differential without any concerning WBC precursors -remains afebrile and hemodynamically stable -repeat CBC with diff--no concerning WBC precursors -discussed with Dr. Henreitta Leber, repeated CT chest/abd rule out any new process, including abscess, worsening pneumonia, etc. CT neg for new process -asked Dr Ellin Saba to weigh in, says likely due to underlying cancer    Sinus tachycardia -personally reviewed EKG--sinus tach, nonspecific T-wave changes -CTA chest--negative PE -was previously tachycardic 130-140 on cardizem/metoprolol>>overall improving -resumed diltiazem 30 mg BID, if BP tolerates, advanced to home dose of 90 mg BID on 10/11   Hyperlipidemia -resumed home rosuvastatin    Essential HTN -hold lisinopril/hydrochlorothiazide for now due to soft BP -overall BPs improving and remain controlled -IV Lopressor as discussed above   Hypokalemia -repleted po and IV   Hypophophatemia -repleted with K phos   Hypothyroidism following RAI therapy -continue synthroid -TSH 0.630   DM2 with hypoglycemia -allow for liberal glycemic control at this point   Hyponatremia -volume depletion, poor solute intake with 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   Severe malnutrition -diet being slowly advanced   02/20/23 Goals of care: repeated CT scan chest/abd/pelvis =02/20/23 due to ongoing weakness in legs, elevated WBC; called by radiologist with worsening findings of advancing tumor burden, bilateral DVT, distal  aorta occlusion, discussed with Dr Ellin Saba, he recommended hospice care, pt not a candidate for chemotherapy at this time; goals of care meeting with Dr Henreitta Leber, son Clint, daughter and discussed with patient and she has full decisional capacity at this time and she was clear that she wanted to be DNR and to have comfort care and residential hospice.   Code Status: DNR  Family Communication: son, sister Disposition:  residential hospice   Consultants:  IR radiology Surgery Henreitta Leber) Ellin Saba (oncology)  Procedures:  Biopsy scapula mass - path c/w poorly differentiated adenocarcinoma  Small bowel pathology c/w adenocarcinoma (could be Meckel's in origin)   Antimicrobials:   Zosyn>>stopped 10/10   Subjective: Pt says she is not in any pain at this time.      Objective: Vitals:   02/20/23 0340 02/20/23 0620 02/20/23 0726 02/20/23 1403  BP: (!) 176/81 (!) 154/93 (!) 154/87 (!) 140/90  Pulse: (!) 136 (!) 121 (!) 125 (!) 125  Resp: (!) 35 (!) 26 (!) 33 (!) 30  Temp: (!) 97.5 F (36.4 C) 97.6 F (36.4 C) 98.2 F (36.8 C) 97.9 F (36.6 C)  TempSrc: Oral Oral Oral Oral  SpO2: 96% 100% 100% 97%  Weight:      Height:        Intake/Output Summary (Last 24 hours) at 02/20/2023 1619 Last data filed at 02/20/2023 1519 Gross per 24 hour  Intake 65.67 ml  Output 200 ml  Net -134.33 ml   Filed Weights   02/08/23 1618 02/08/23 2056  Weight: 83 kg 74.2 kg   Examination:  General exam: Appears thin, chronically ill, NAD, ambulating in room.    Respiratory system: tachypneic, diminished BS RLL. Moderate increased work of breathing.  Cardiovascular system: normal S1 &  S2 heard. No JVD, murmurs, rubs, gallops or clicks. No pedal edema. Gastrointestinal system: Abdomen is nondistended, soft and nontender. No organomegaly or masses felt. Normal bowel sounds heard. Central nervous system: Alert and oriented. No focal neurological deficits. Extremities: profound weakness bilateral  lower extremities.  Skin: No rashes, lesions or ulcers. Psychiatry: Judgement and insight appear normal. Mood & affect appropriate.   Data Reviewed: I have personally reviewed following labs and imaging studies  CBC: Recent Labs  Lab 02/15/23 0442 02/16/23 0425 02/17/23 0407 02/18/23 0412 02/20/23 0412  WBC 46.1* 50.9* 52.4* 48.1* 55.5*  NEUTROABS 42.0* 42.2* 47.7* 41.1*  --   HGB 8.1* 8.2* 8.2* 8.1* 7.9*  HCT 26.1* 26.9* 25.8* 24.5* 24.7*  MCV 82.6 82.5 82.2 79.3* 81.3  PLT 352 311 152 92* 38*    Basic Metabolic Panel: Recent Labs  Lab 02/14/23 0439 02/15/23 0442 02/16/23 0425 02/17/23 0407 02/18/23 0412 02/19/23 0414 02/20/23 0412  NA 137 135 133* 133* 132* 134* 135  K 3.2* 3.1* 3.4* 3.1* 2.8* 3.2* 3.6  CL 104 100 98 98 100 102 107  CO2 25 25 23  20* 22 21* 19*  GLUCOSE 112* 101* 64* 73 128* 123* 178*  BUN 17 14 8  6* 5* <5* 5*  CREATININE 0.58 0.58 0.60 0.62 0.51 0.57 0.58  CALCIUM 8.2* 8.4* 8.4* 8.2* 8.1* 8.3* 8.3*  MG 2.1 2.0 2.2 2.0 2.0  --   --   PHOS 2.3* 2.4*  --  2.7  --   --   --     CBG: Recent Labs  Lab 02/16/23 0627 02/16/23 2033 02/20/23 0358  GLUCAP 77 100* 166*    Recent Results (from the past 240 hour(s))  Aerobic/Anaerobic Culture w Gram Stain (surgical/deep wound)     Status: None (Preliminary result)   Collection Time: 02/12/23  6:25 PM   Specimen: Path fluid; Body Fluid  Result Value Ref Range Status   Specimen Description   Final    FLUID Performed at [facility], [address] [place], [place] [address]    Special Requests   Final    NONE Performed at [facility], [address], [place], [place] [address]    Gram Stain   Final    MODERATE WBC PRESENT, PREDOMINANTLY PMN RARE BUDDING YEAST SEEN RARE GRAM POSITIVE RODS Performed at [facility], [address], [place], [place] [address]    Culture   Final    CULTURE REINCUBATED FOR BETTER GROWTH RARE ACTINOMYCES NAESLUNDII RARE VEILLONELLA SPECIES    Report  Status PENDING  Incomplete  MRSA Next Gen by PCR, Nasal     Status: None   Collection Time: 02/16/23  8:16 AM   Specimen: Nasal Mucosa; Nasal Swab  Result Value Ref Range Status   MRSA by PCR Next Gen NOT DETECTED NOT DETECTED Final    Comment: (NOTE) The GeneXpert MRSA Assay (FDA approved for NASAL specimens only), is one component of a comprehensive MRSA colonization surveillance program. It is not intended to diagnose MRSA infection nor to guide or monitor treatment for MRSA infections. Test performance is not FDA approved in patients less than 52 years old. Performed at [facility], [address] [place], [place] [address]      Radiology Studies: CT CHEST ABDOMEN PELVIS W CONTRAST  Addendum Date: 02/20/2023   ADDENDUM REPORT: 02/20/2023 14:21 ADDENDUM: Critical Value/emergent results were called by telephone at the time of interpretation on 02/20/2023 at 2:20 pm to provider [facility] , who verbally  acknowledged these results. Electronically Signed   By: Signa Kell M.D.   On: 02/20/2023 14:21   Result Date: 02/20/2023 CLINICAL DATA:  Metastatic disease evaluation. New finding of sepsis. History of lung cancer. * Tracking Code: BO * EXAM: CT CHEST, ABDOMEN, AND PELVIS WITH CONTRAST TECHNIQUE: Multidetector CT imaging of the chest, abdomen and pelvis was performed following the standard protocol during bolus administration of intravenous contrast. RADIATION DOSE REDUCTION: This exam was performed according to the departmental dose-optimization program which includes automated exposure control, adjustment of the mA and/or kV according to patient size and/or use of iterative reconstruction technique. CONTRAST:  OMNIPAQUE IOHEXOL 300 MG/ML  SOLN COMPARISON:  02/17/2023 FINDINGS: CT CHEST FINDINGS Cardiovascular: Normal heart size. Aortic atherosclerosis. Coronary artery calcifications. Small pericardial effusion, unchanged. Tumor extension into the superior vena cava of rum  right paratracheal adenopathy is again noted, image 21/2 and coronal image 81/5. Tumor encases the distal right main pulmonary artery with complete occlusion and likely intraluminal extension of the right upper lobe pulmonary artery. There is also complete occlusion of the right middle lobe pulmonary artery. Mass effect upon the proximal right lower lobe pulmonary artery is identified. Although suboptimal for the evaluation of pulmonary emboli there are suspicious filling defects within the distal right lower lobar pulmonary artery and proximal left lower lobar pulmonary arteries which are concerning for acute pulmonary embolus. Similarly, there is a focal filling defect within a lingular branch of the left pulmonary artery, filling defect within the right lower lobe pulmonary artery is noted, image 100/5. There is also a filling defect within a segmental branch of the left upper lobe pulmonary artery. Findings are concerning for superimposed pulmonary emboli. Mediastinum/Nodes: Bulky mediastinal and right hilar adenopathy is again seen. Index right paratracheal lymph node with mass effect upon the carina measures 5.1 x 5.5 cm, image 22/2. Formally this measured the same. Left pre vascular lymph node measures 4.3 x 3.1 cm, image 19/2. Previously 4.2 x 2.9 cm. Right hilar mass measures 6.7 cm in AP dimension. This is compared with 6.3 cm previously. Postobstructive pneumonitis within the right upper lobe is again seen and is not significantly improved from previous exam. Lungs/Pleura: Small bilateral pleural effusions are again noted, right greater than left. These are stable to slightly increased in the interval. Right upper lobe tumor with possible lymphangitic spread is again noted anteriorly, this measures 8.7 x 5.3 cm, image 26/2. Formally 8.6 by 5.6 cm. Pleural nodule overlying the lateral right mid lung measures 3.3 x 2.1 cm, image 26/2. Previously 3.0 by 2.0 cm. Patchy ground-glass attenuation noted within  the left lung. Musculoskeletal: There is a mass encasing the body of sternum with underlying destructive bone changes measuring 6.6 x 7.0 cm, image 21/2. Formally this measured the same. CT ABDOMEN PELVIS FINDINGS Hepatobiliary: No focal liver abnormality. Mild central biliary prominence. Gallbladder appears normal. Mild increase caliber of the common bile duct measures 1 cm, similar to previous exam. Pancreas: Unremarkable. No pancreatic ductal dilatation or surrounding inflammatory changes. Spleen: Normal in size without focal abnormality. Adrenals/Urinary Tract: Normal left adrenal gland. Right adrenal mass measures 5.2 x 4.1 cm, unchanged from previous exam. Small amount of macroscopic fat and calcification noted within the posterior aspect of this lesion. 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 Mild bilateral perinephric fat stranding. Small right scratch set small bilateral kidney cysts noted. No follow-up imaging recommended. No signs of hydronephrosis. Urinary bladder is normal. Stomach/Bowel: The  stomach appears within normal limits. Similar appearance of mildly increased caliber small bowel loops within the left hemiabdomen and central left lower abdomen proximal to small bowel resection site, image 94/2. Here, the small bowel loops measure up to 3.6 cm proximal to the suture line. The remaining small bowel loops are normal in caliber. Enteric contrast material is noted within the colon up to the level of the rectum. No bowel wall thickening or inflammatory change identified. Vascular/Lymphatic: Aortic atherosclerosis. There is thrombus identified within a branch of the superior mesenteric vein, image 72/5. This has increased in size when compared with study from 12/18/2022. 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 There is patency of  bilateral external and internal iliac arteries as well as the femoral or arteries. Reproductive: Status post hysterectomy. No adnexal masses. Other: Peritoneal and retroperitoneal nodularity is identified bilaterally compatible with metastatic disease. Within the left posterior retroperitoneum there is a soft tissue nodule measuring 1.8 cm, image 80/2. Previously 1.6 cm. Right lower quadrant peritoneal nodule measures 0.8 cm, image 88/2. Previously 0.7 cm. Small volume of free fluid noted within the posterior pelvis. Possible serosal metastasis to the lower rectum with increase mural soft tissue, image 104/2. No signs of pneumoperitoneum. Open ventral abdominal wound identified. Musculoskeletal: No acute abnormality. Degenerative disc disease within the lower lumbar spine. IMPRESSION: 1. 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 There is patency of bilateral external and internal iliac arteries as well as the femoral or arteries. 2. There is thrombus identified within a branch of the superior mesenteric vein. This has increased in size when compared with study from 12/18/2022. 3. Tumor extension into the superior vena cava of from right paratracheal adenopathy is again noted. Tumor encases the distal right main pulmonary artery with complete occlusion and likely intraluminal extension of the proximal right upper lobe pulmonary artery. There is also complete occlusion of the right middle lobe pulmonary artery. Mass effect upon the proximal right lower lobe pulmonary artery is identified. 4. Although suboptimal for the evaluation of pulmonary emboli there are suspicious filling defects within the distal right lower lobar pulmonary artery and proximal left lower lobar pulmonary arteries which are concerning for acute pulmonary embolus. Similarly, there is a focal filling defect within a branch of the left pulmonary artery.  Findings are concerning for pulmonary emboli. 5. Right upper lobe tumor with possible lymphangitic spread is again noted and is not significantly changed from previous exam. 6. Similar appearance of bulky mediastinal and right hilar adenopathy. 7. Small bilateral pleural effusions, right greater than left. These are stable to slightly increased in the interval. 8. Right adrenal mass measures 5.2 x 4.1 cm, unchanged from previous exam. 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 9. Similar appearance of mildly increased caliber small bowel loops within the left hemiabdomen and central left lower abdomen proximal to small bowel resection site. 10. Possible serosal metastasis to the lower rectum with increase mural soft tissue. 11. Similar size of right scapular metastasis with underlying destructive changes of the scapular body. 12.  Aortic Atherosclerosis (ICD10-I70.0). Electronically Signed: By: Signa Kell M.D. On: 02/20/2023 14:15   CT HEAD WO CONTRAST ( )  Result Date: 02/20/2023 CLINICAL DATA:  72 year old female with lower extremity numbness and burning. Neurologic deficit. Metastatic lung cancer. EXAM: CT HEAD WITHOUT CONTRAST TECHNIQUE: Contiguous  axial images were obtained from the base of the skull through the vertex without intravenous contrast. RADIATION DOSE REDUCTION: This exam was performed according to the departmental dose-optimization program which includes automated exposure control, adjustment of the mA and/or kV according to patient size and/or use of iterative reconstruction technique. COMPARISON:  Head CT 02/08/2023. FINDINGS: Brain: Cerebral volume is within normal limits for age. No midline shift, ventriculomegaly, mass effect, evidence of mass lesion, intracranial hemorrhage or evidence of cortically based acute infarction. Gray-white matter differentiation is stable and within normal limits for age. Tiny chronic left  cerebellar infarct better demonstrated by MRI last month. Vascular: Calcified atherosclerosis at the skull base. No suspicious intracranial vascular hyperdensity. Skull: No acute or suspicious osseous lesion identified. Sinuses/Orbits: Visualized paranasal sinuses and mastoids are clear. Other: No acute orbit or scalp soft tissue finding. IMPRESSION: No acute intracranial abnormality. Stable and negative for age noncontrast CT appearance of the brain. Electronically Signed   By: Odessa Fleming M.D.   On: 02/20/2023 13:38   US Venous Img Lower Bilateral (DVT)  Result Date: 02/20/2023 CLINICAL DATA:  Bilateral lower extremity edema EXAM: BILATERAL LOWER EXTREMITY VENOUS DOPPLER ULTRASOUND TECHNIQUE: Gray-scale sonography with graded compression, as well as color Doppler and duplex ultrasound were performed to evaluate the lower extremity deep venous systems from the level of the common femoral vein and including the common femoral, femoral, profunda femoral, popliteal and calf veins including the posterior tibial, peroneal and gastrocnemius veins when visible. The superficial great saphenous vein was also interrogated. Spectral Doppler was utilized to evaluate flow at rest and with distal augmentation maneuvers in the common femoral, femoral and popliteal veins. COMPARISON:  None Available. FINDINGS: RIGHT LOWER EXTREMITY Common Femoral Vein: No evidence of thrombus. Normal compressibility, respiratory phasicity and response to augmentation. Saphenofemoral Junction: No evidence of thrombus. Normal compressibility and flow on color Doppler imaging. Profunda Femoral Vein: No evidence of thrombus. Normal compressibility and flow on color Doppler imaging. Femoral Vein: No evidence of thrombus. Normal compressibility, respiratory phasicity and response to augmentation. Popliteal Vein: No evidence of thrombus. Normal compressibility, respiratory phasicity and response to augmentation. Calf Veins: No evidence of thrombus.  Normal compressibility and flow on color Doppler imaging. Superficial Great Saphenous Vein: No evidence of thrombus. Normal compressibility. Venous Reflux:  None. Other Findings:  None. LEFT LOWER EXTREMITY Common Femoral Vein: No evidence of thrombus. Normal compressibility, respiratory phasicity and response to augmentation. Saphenofemoral Junction: No evidence of thrombus. Normal compressibility and flow on color Doppler imaging. Profunda Femoral Vein: No evidence of thrombus. Normal compressibility and flow on color Doppler imaging. Femoral Vein: No evidence of thrombus. Normal compressibility, respiratory phasicity and response to augmentation. Popliteal Vein: No evidence of thrombus. Normal compressibility, respiratory phasicity and response to augmentation. Calf Veins: No evidence of thrombus. Normal compressibility and flow on color Doppler imaging. Superficial Great Saphenous Vein: No evidence of thrombus. Normal compressibility. Venous Reflux:  None. Other Findings:  None. IMPRESSION: No evidence of deep venous thrombosis in either lower extremity. Electronically Signed   By: Malachy Moan M.D.   On: 02/20/2023 10:44   DG CHEST PORT 1 VIEW  Result Date: 02/20/2023 CLINICAL DATA:  72 year old female with history of tachypnea. EXAM: PORTABLE CHEST 1 VIEW COMPARISON:  Chest x-ray 02/12/2023. FINDINGS: Previously noted nasogastric tube has been removed. Persistent right upper lobe airspace consolidation. Left lung is clear. No definite pleural effusions. No pneumothorax. No evidence of pulmonary edema. Heart size is normal. Upper mediastinal contours are grossly distorted. IMPRESSION: 1.  Persistent right upper lobe pneumonia related to obstructing right hilar mass, with mediastinal lymphadenopathy, similar to prior study. Electronically Signed   By: Trudie Reed M.D.   On: 02/20/2023 05:56   DG Abd 1 View  Result Date: 02/19/2023 CLINICAL DATA:  Abdominal distension.  Constipation. EXAM:  ABDOMEN - 1 VIEW COMPARISON:  CT of the abdomen and pelvis 02/17/2023. FINDINGS: Single supine view of the abdomen demonstrates a stable nonobstructive bowel gas pattern. There is a small amount of residual contrast within the right colon. No evidence of significant constipation. Midline skin staples are noted. There are stable degenerative changes in the spine. IMPRESSION: No evidence of bowel obstruction or significant constipation. Electronically Signed   By: Carey Bullocks M.D.   On: 02/19/2023 14:29    Scheduled Meds:  feeding supplement  237 mL Oral TID BM   Continuous Infusions:   LOS: 12 days   Time spent: 60 mins  Conner Neiss Laural Benes, MD How to contact the [facility] Attending or Consulting provider [phone] - 7P or covering provider during after hours 7P -7A, for this patient?  Check the care team in [facility] and look for a) attending/consulting TRH provider listed and b) the [facility] team listed Log into [email or web address] to find provider on call.  Locate the [facility] provider you are looking for under [facility] and page to a number that you can be directly reached. If you still have difficulty reaching the provider, please page the [facility] (Director on Call) for the Hospitalists listed on amion for assistance.  02/20/2023, 4:19 PM

## 2023-02-20 NOTE — Progress Notes (Signed)
  Interdisciplinary Goals of Care Family Meeting   Date carried out: 02/20/2023  Location of the meeting: Bedside  Member's involved: Physician, Bedside Registered Nurse, Family Member or next of kin, and Other: Surgeon Dr. Benjamin Stain Power of Attorney or acting medical decision maker: Patient has decisional capacity at this time and son Christine Poole present for meeting that she designated as her next in Hospital doctor.     Discussion: We discussed goals of care for Christine Poole .  I was present with patient's son Christine Poole, her sister, Dr. Henreitta Leber and we discussed my conversation with radiologist about new findings of bilateral pulmonary emboli, distal aortic occlusion, SMV clot, progressive of tumor burden in last 48 hours.  I discussed this with Dr. Ellin Saba who recommended hospice care.  Pt is not a candidate for chemotherapy at this time given her severely deconditioned state.  After discussion with patient she was clear that she wanted to be do not resuscitate and she would like to pursue full comfort care and residential hospice.  We shared this with her son and sister and they verbalized understanding and agree with patient's wishes and said that they want [place] to focus care now on keeping her comfortable.  I have reached out to Child psychotherapist and she reported that [place] hospice RN will plan to see patient tomorrow to evaluate for residential hospice.  Full comfort care orders placed.   Code status:   Code Status: Do not attempt resuscitation (DNR) - Comfort care   Disposition: In-patient comfort care  Time spent for the meeting: 35 mins   Standley Dakins, MD  02/20/2023, 3:54 PM

## 2023-02-20 NOTE — Progress Notes (Signed)
   02/20/23 0428  Provider Notification  Provider Name/Title Dr Victorino Dike  Date Provider Notified 02/20/23  Time Provider Notified [phone]  Method of Notification Page  Notification Reason Critical Result  Test performed and critical result WBC 55.5  Date Critical Result Received 02/20/23  Time Critical Result Received [phone]

## 2023-02-20 NOTE — Progress Notes (Signed)
One time dose of Morphine 2mg  was given. The medication was pulled out under the Morphine 1mg  order prior to the one time dose being approved by pharmacy. The medication was given while the doctor was still putting in orders in this emergent situation. (Patient was a MEWS 6 and complaining of leg numbness and burning in her chest.) The Pyxis was flagging me to waste 1mg  of morphine, however, there was nothing to waste. I spoke with pharmacy in [place] to let them know what had happened. I am placing this note in the chart as well to document why there was not an actual waste.

## 2023-02-20 NOTE — Progress Notes (Signed)
Called to patient room by nurse tech. Patient stated," my legs feel numb and my feet are burning". Patient became agitated and shaky. MD on the floor at the time. Vital signs are 97.5/ 136/ 37/ 173/81/ 99 RA. MD on call on the floor and came to patients room. EKG performed. CBG 166.  New orders entered.

## 2023-02-20 NOTE — Progress Notes (Addendum)
[facility]  Spoke with patient. No pain in her abdomen. She was having no feeling in her legs earlier and felt like her feet were burning.  Dr. Laural Benes and Dr. Ellin Saba talked and given the increasing WBC. A CT scan was ordered to reassesses her abdomen and chest.   BP (!) 140/90   Pulse (!) 125   Temp 97.9 F (36.6 C) (Oral)   Resp (!) 30   Ht 5\' 6"  (1.676 m)   Wt 74.2 kg   SpO2 97%   BMI 26.40 kg/m  Soft, mildly distended, dressing replaced, some minor granulation noted   Patient s/p Ex lap, SBR for perforated SB and pathology back with adenocarcinoma.   CT scans with worsening cancer and now with clots in the aorta and iliacs and PE. Her disease has progressed extensively. The leg numbness is likely from the aortic clots.   I sat down with her and Dr. Laural Benes and she wants to be  DNR and is likely opting for comfort care as there are no chemotherapy options.   Talked to son and daughter in law with Dr. Laural Benes. All in agreement. DNR and comfort care. Possible hospice.   Algis Greenhouse, MD [facility] [address] [place], [place] [address] [phone] (office)

## 2023-02-20 NOTE — Progress Notes (Signed)
   02/20/23 0441  Provider Notification  Provider Name/Title Dr Victorino Dike  Date Provider Notified 02/20/23  Time Provider Notified [phone]  Method of Notification Page  Notification Reason Critical Result  Test performed and critical result Lactic Acid 2.9  Date Critical Result Received 02/20/23  Time Critical Result Received 0440

## 2023-02-20 NOTE — Evaluation (Addendum)
Physical Therapy Evaluation Patient Details Name: Christine Poole MRN: [id number] DOB: 12-17-1950 Today's Date: 02/20/2023  History of Present Illness  71 y.o. female with medical history significant of arthritis, glaucoma, hypothyroidism, hypertension, hyperlipidemia, and more presents to the ED with a chief complaint of generalized weakness.  Patient reports she started feeling ill about 1 month ago.  The symptoms have waxed and waned.  She reports she is lost about 23 pounds in that 1 month.  Her main complaint is generalized weakness.  She used to be able to ambulate independently.  Recently she has been counter/furniture surfing because she cannot walk without holding onto something.  She reports increased shortness of breath.  The shortness of breath has been constant unlike the other symptoms which have eased off and then come back several times.  She reports she has a cough.  Cough hurts her right shoulder.  She describes it as a sharp pain. Patient complains of early satiety and decreased po intake.  She denies any dysphagia or odynophagia.  She is a former smoker, but quit many years ago.  At arrival she had slurred speech that she attributes to her generalized weakness, but code stroke was called. 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  There is some compression of the SVC.  There is also an infiltrative mass involving the right inferior scapula.  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  The patient underwent percutaneous biopsy of her right scapula on 02/11/2023.  Her hospital stay was prolonged secondary to development of abdominal pain on 02/12/2023 for which CT of the abdomen and pelvis revealed free air.  The  patient was taken to the operating room for exploratory laparotomy.  She was found to have small bowel perforation.  She underwent small bowel resection and primary anastomosis with Dr. Algis Greenhouse.  Clinical Impression   Pt is a 72yo female who was evaluated by Physical Therapy on 02/10/23. PT Evaluation re-order this date due to significant muscular strength changes early this am. Prior evaluation, pt was independent and at functional baseline. Since then, medical changes have occurred, finding metastatic cancer. Pt was limited in today's Physical Therapy Evaluation, due to significant muscular weakness and tachycardic at bed level activity. Further mobility was terminated. Based on BLE muscular findings, pt would be dependent for OOB transfers and mobility.. Based upon these deficits/impairments, pt would benefit from skilled acute physical therapy services to address the above deficits and improve their functional status.  Pending discharge recommendations at this time due to Palliative consult being place. Would potentially recommend discharge to acute inpatient rehabilitation based on PLOF approximately 2 weeks ago. PT will follow pt case and assist as needed. Thank you.              If plan is discharge home, recommend the following:     Can travel by private vehicle        Equipment Recommendations    Recommendations for Other Services       Functional Status Assessment Patient has not had a recent decline in their functional status     Precautions / Restrictions Precautions Precautions: Fall Restrictions Weight Bearing Restrictions: No      Mobility  Bed Mobility  General bed mobility comments: Bed level MMT assessment as pt tacycardiac @ 123 bpm. >110 bpm contraindicated.    Transfers                   General transfer comment: Not attempted. HR contraindicated.    Ambulation/Gait               General Gait Details: Not  attempted, contraindicated due to HR.  Stairs            Wheelchair Mobility     Tilt Bed    Modified Rankin (Stroke Patients Only)       Balance                                             Pertinent Vitals/Pain Pain Assessment Pain Assessment: Faces Faces Pain Scale: Hurts even more Pain Location: everywhere    Home Living Family/patient expects to be discharged to:: Private residence Living Arrangements: Alone   Type of Home: House Home Access: Level entry       Home Layout: One level Home Equipment: None      Prior Function Prior Level of Function : Independent/Modified Independent             Mobility Comments: Independent wtih household and community ambulation. ADLs Comments: Independent with all ADLs, including driving. Pt reports working full time     Extremity/Trunk Assessment   Upper Extremity Assessment Upper Extremity Assessment: Defer to OT evaluation    Lower Extremity Assessment Lower Extremity Assessment: RLE deficits/detail;LLE deficits/detail RLE Deficits / Details: 2+/5 MMT gross RLE RLE Sensation: WNL LLE Deficits / Details: 2+/5 MMT gross LLE LLE Sensation: WNL       Communication   Communication Communication: No apparent difficulties  Cognition Arousal: Alert Behavior During Therapy: WFL for tasks assessed/performed Overall Cognitive Status: Within Functional Limits for tasks assessed                                          General Comments      Exercises     Assessment/Plan    PT Assessment Patient needs continued PT services  PT Problem List Decreased strength;Decreased range of motion;Decreased activity tolerance;Decreased balance;Decreased mobility       PT Treatment Interventions DME instruction;Gait training;Stair training;Functional mobility training;Therapeutic activities;Therapeutic exercise;Balance training;Neuromuscular re-education;Wheelchair mobility  training    PT Goals (Current goals can be found in the Care Plan section)  Acute Rehab PT Goals Patient Stated Goal: not established yet Time For Goal Achievement: 03/06/23 Potential to Achieve Goals: Fair    Frequency Min 4X/week     Co-evaluation               AM-PAC PT "6 Clicks" Mobility  Outcome Measure Help needed turning from your back to your side while in a flat bed without using bedrails?: A Lot Help needed moving from lying on your back to sitting on the side of a flat bed without using bedrails?: A Lot Help needed moving to and from a bed to a chair (including a wheelchair)?: A Lot Help needed standing up from a chair using your arms (e.g., wheelchair or bedside chair)?: A Lot Help needed to walk in hospital room?: A Lot Help needed climbing 3-5 steps with a  railing? : A Lot 6 Click Score: 12    End of Session   Activity Tolerance: Treatment limited secondary to medical complications (Comment) Patient left: in bed;with call bell/phone within reach;with family/visitor present Nurse Communication: Need for lift equipment PT Visit Diagnosis: Muscle weakness (generalized) (M62.81)    Time: 2956-2130 PT Time Calculation (min) (ACUTE ONLY): 16 min   Charges:   PT Evaluation $PT Eval Moderate Complexity: 1 Mod   PT General Charges $$ ACUTE PT VISIT: 1 Visit         Nelida Meuse PT, DPT Physical Therapist with [facility] [phone] office   Nelida Meuse 02/20/2023, 1:04 PM

## 2023-02-20 NOTE — Plan of Care (Signed)
  Problem: Activity: Goal: Ability to tolerate increased activity will improve Outcome: Not Applicable   Problem: Clinical Measurements: Goal: Ability to maintain a body temperature in the normal range will improve Outcome: Not Applicable   Problem: Respiratory: Goal: Ability to maintain adequate ventilation will improve Outcome: Not Applicable Goal: Ability to maintain a clear airway will improve Outcome: Not Applicable   Problem: Education: Goal: Knowledge of General Education information will improve Description: Including pain rating scale, medication(s)/side effects and non-pharmacologic comfort measures Outcome: Not Applicable   Problem: Health Behavior/Discharge Planning: Goal: Ability to manage health-related needs will improve Outcome: Not Applicable   Problem: Clinical Measurements: Goal: Ability to maintain clinical measurements within normal limits will improve Outcome: Not Applicable Goal: Will remain free from infection Outcome: Not Applicable Goal: Diagnostic test results will improve Outcome: Not Applicable Goal: Respiratory complications will improve Outcome: Not Applicable Goal: Cardiovascular complication will be avoided Outcome: Not Applicable   Problem: Activity: Goal: Risk for activity intolerance will decrease Outcome: Not Applicable   Problem: Nutrition: Goal: Adequate nutrition will be maintained Outcome: Not Applicable   Problem: Coping: Goal: Level of anxiety will decrease Outcome: Not Applicable   Problem: Elimination: Goal: Will not experience complications related to bowel motility Outcome: Not Applicable Goal: Will not experience complications related to urinary retention Outcome: Not Applicable   Problem: Pain Managment: Goal: General experience of comfort will improve Outcome: Not Applicable   Problem: Safety: Goal: Ability to remain free from injury will improve Outcome: Not Applicable   Problem: Skin Integrity: Goal:  Risk for impaired skin integrity will decrease Outcome: Not Applicable   Problem: Education: Goal: Knowledge of the prescribed therapeutic regimen will improve Outcome: Not Applicable   Problem: Coping: Goal: Ability to identify and develop effective coping behavior will improve Outcome: Not Applicable   Problem: Clinical Measurements: Goal: Quality of life will improve Outcome: Not Applicable   Problem: Respiratory: Goal: Verbalizations of increased ease of respirations will increase Outcome: Not Applicable   Problem: Role Relationship: Goal: Family's ability to cope with current situation will improve Outcome: Not Applicable Goal: Ability to verbalize concerns, feelings, and thoughts to partner or family member will improve Outcome: Not Applicable   Problem: Pain Management: Goal: Satisfaction with pain management regimen will improve Outcome: Not Applicable

## 2023-02-20 NOTE — TOC Progression Note (Addendum)
Transition of Care [facility]) - Progression Note    Patient Details  Name: Christine Poole MRN: [id number] Date of Birth: Dec 25, 1950  Transition of Care [facility] CM/SW Contact  Catalina Gravel, LCSW Phone Number: 02/20/2023, 3:20 PM  Clinical Narrative:    CSW received [place] from MD. Pt interested in residential hospice.  CSW contacted [facility] on call number to refer pt for possible plan to move to residential hospice.  TOC to follow.  Addendum: Pt interested in [facility] location.   On call nurse Kendal Hymen returned call, agreed that they can assess pt tomorrow.    Expected Discharge Plan: Home/Self Care Barriers to Discharge: Continued Medical Work up  Expected Discharge Plan and Services       Living arrangements for the past 2 months: Single Family Home                                       Social Determinants of Health (SDOH) Interventions SDOH Screenings   Food Insecurity: No Food Insecurity (02/09/2023)  Housing: Low Risk  (02/09/2023)  Transportation Needs: No Transportation Needs (02/09/2023)  Utilities: Not At Risk (02/09/2023)  Tobacco Use: Medium Risk (02/20/2023)    Readmission Risk Interventions    02/17/2023   12:36 PM  Readmission Risk Prevention Plan  Transportation Screening Complete  Home Care Screening Complete  Medication Review (RN CM) Complete

## 2023-02-20 NOTE — Progress Notes (Signed)
Medication given, antibiotics started. Patient continues to complain of "legs feeling numb". Patient is shaky and states, " I can't get comfortable". MD made aware.

## 2023-02-21 DIAGNOSIS — C799 Secondary malignant neoplasm of unspecified site: Secondary | ICD-10-CM | POA: Diagnosis not present

## 2023-02-21 DIAGNOSIS — Z515 Encounter for palliative care: Secondary | ICD-10-CM | POA: Diagnosis not present

## 2023-02-21 DIAGNOSIS — R918 Other nonspecific abnormal finding of lung field: Secondary | ICD-10-CM | POA: Diagnosis not present

## 2023-02-21 DIAGNOSIS — J189 Pneumonia, unspecified organism: Secondary | ICD-10-CM | POA: Diagnosis not present

## 2023-02-21 DIAGNOSIS — L8990 Pressure ulcer of unspecified site, unspecified stage: Secondary | ICD-10-CM | POA: Insufficient documentation

## 2023-02-21 NOTE — Progress Notes (Signed)
Patient is resting in her bed at this time. Respirations are 26 at this time. Given prn medication. Patient states" I have only dozed off for about an hour tonight". Reorganized pillows to make more comfortable in bed. Plan of care ongoing.

## 2023-02-21 NOTE — Discharge Summary (Signed)
Physician Discharge Summary  DIEP COPLAND AOZ:[id number] DOB: [birth year] DOA: 02/08/2023  PCP: Renaye Rakers, MD  Admit date: 02/08/2023 Discharge date: 02/21/2023  Disposition: Residential Hospice  Recommendations Symptom management per hospice protocol   Discharge Condition: Hospice   CODE STATUS: DNR   Brief Hospitalization Summary: Please see all hospital notes, images, labs for full details of the hospitalization. Admission provider HPI:  72 y.o. female with medical history significant of arthritis, glaucoma, hypothyroidism, hypertension, hyperlipidemia, and more presents to the ED with a chief complaint of generalized weakness.  Patient reports she started feeling ill about 1 month ago.  The symptoms have waxed and waned.  She reports she is lost about 23 pounds in that 1 month.  Her main complaint is generalized weakness.  She used to be able to ambulate independently.  Recently she has been counter/furniture surfing because she cannot walk without holding onto something.  She reports increased shortness of breath.  The shortness of breath has been constant unlike the other symptoms which have eased off and then come back several times.  She reports she has a cough.  Cough hurts her right shoulder.  She describes it as a sharp pain. Patient complains of early satiety and decreased po intake.  She denies any dysphagia or odynophagia.  She is a former smoker, but quit many years ago. At arrival she had slurred speech that she attributes to her generalized weakness, but code stroke was called.  In ED, pt speech off intermittently, no other significant neuro deficit. Code stroke activated. CT no acute finding. On exam, pt has intermittent stuttering pattern speech, distractable. MR brain was negative.  Neurology, Dr. Jerel Shepherd, saw the patient and felt Pt speech symptoms with stuttering speech more likely functional.   Workup during the hospitalization revealed the patient had metastatic  cancer with right upper lobe lung mass extending into the right hilum with complete obstruction of the right upper lobe bronchus.  There is some compression of the SVC.  There is also an infiltrative mass involving the right inferior scapula. 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  The patient underwent percutaneous biopsy of her right scapula on 02/11/2023.    Her hospital stay was prolonged secondary to development of abdominal pain on 02/12/2023 for which CT of the abdomen and pelvis revealed free air.  The patient was taken to the operating room for exploratory laparotomy.  She was found to have small bowel perforation.  She underwent small bowel resection and primary anastomosis with Dr. Algis Greenhouse.  Pt developed increasing WBC and serial CT scans reveal rapidly progressive tumor involvement, bilateral PE, distal aorta occlusion and SMV clot; Drs. Lamona Curl, Bridges recommending hospice care.  Goals of care meeting 02/20/23 with Dr. Henreitta Leber, patient, son, sister and patient transitioning to full comfort focused care and will pursue residential hospice placement.   Hospital Course by problem   Sepsis -present on admission -presented with fever and tachycardia -PCT 1.05 -due to post-obstructive pneumonia -completed IVF and antibiotics -lactic acid 1.5 -10/1 blood cultures negative   Obstructive pneumonia -02/08/23 CTA chest--5x7 cm RUL mass extending into R-hilum with complete obstruction of RUL bronchus and compression of SVC;   mass effect into R-pulm artery suggesting tumor thrombus;  infiltrative mass of R-inferior scapula -treated with IV zosyn -transitioned to full comfort care on 10/12   Small bowel perforation -02/12/2023 CT abdomen noted pneumoperitoneum -  02/12/2023--exploratory laparotomy with small bowel resection and primary  anastomosis -completed Zosyn>>follow intra-op culture -Postoperative care per Dr. Henreitta Leber -Added fluconazole 10/5 - completed 10/10 -attempted advancing diet, NG removed 10/10 -- transitioned to full comfort measures on 10/12   Lung masses -CTA chest neg for PE;  results as discussed above -case discussed with med/onc, Dr. Dollene Primrose IR biopsy of scapula unless IR determines there is an easier target -10/3--IR biopsy of scapular area:  results c/w poor differentiated adenocarcinoma -10/3--discussed with Dr. Tawny Asal will arrange outpt follow next week -10/2 CT abd/pelvis--extensive LN porta hepatis/peripancreatic/retroperitoneal;  R-adrenal mass; bilateral perirenal space nodules -10/12 - transitioned to full comfort measures   Leukocytosis suspect Leukemoid reaction -felt to be reactive due metastatic cancer, obstructive pneumonia -differential without any concerning WBC precursors -remains afebrile and hemodynamically stable -repeat CBC with diff--no concerning WBC precursors -discussed with Dr. Henreitta Leber, repeated CT chest/abd rule out any new process, including abscess, worsening pneumonia, etc. CT neg for new process -asked Dr Ellin Saba to weigh in, says likely due to underlying cancer    Sinus tachycardia -personally reviewed EKG--sinus tach, nonspecific T-wave changes -CTA chest--negative PE -was previously tachycardic 130-140 on cardizem/metoprolol>>overall improving -resumed diltiazem 30 mg BID, if BP tolerates, advanced to home dose of 90 mg BID on 10/11 -transitioned to full comfort measures on 10/12   Hyperlipidemia   Essential HTN   Hypokalemia -repleted po and IV   Hypophophatemia -repleted with K phos   Hypothyroidism following RAI therapy   DM2 with hypoglycemia -allow for liberal glycemic control at this point   Hyponatremia -volume depletion, poor solute intake with 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 -transitioned to full comfort care on 10/12    Severe malnutrition -transitioned to full comfort care on 10/12   02/20/23 Goals of care: repeated CT scan chest/abd/pelvis =02/20/23 due to ongoing weakness in legs, elevated WBC; called by radiologist with worsening findings of advancing tumor burden, bilateral DVT, distal aorta occlusion, discussed with Dr Ellin Saba, he recommended hospice care, pt not a candidate for chemotherapy at this time; goals of care meeting with Dr Henreitta Leber, son Clint, daughter and discussed with patient and she has full decisional capacity at this time and she was clear that she wanted to be DNR and to have comfort care and residential hospice.   Discharge Diagnoses:  Principal Problem:   CAP (community acquired pneumonia) Active Problems:   Hypothyroidism following radioiodine therapy   Hypokalemia   Essential hypertension   HLD (hyperlipidemia)   Lung cancer (HCC)   Slurred speech   Sepsis due to pneumonia (HCC)   Protein-calorie malnutrition, severe   Obstructive pneumonia   Lobar pneumonia (HCC)   Mass of right lung   Free intraperitoneal air   Small bowel perforation (HCC)   Comfort measures only status   Pressure injury of skin   Discharge Instructions:  Allergies as of 02/21/2023   No Known Allergies      Medication List     STOP taking these medications    aspirin 81 MG tablet   diltiazem 90 MG tablet Commonly known as: CARDIZEM   etodolac 400 MG tablet Commonly known as: LODINE   ezetimibe 10 MG tablet Commonly known as: ZETIA   Janumet XR [id number] MG Tb24 Generic drug: SitaGLIPtin-MetFORMIN HCl   Jardiance 25 MG Tabs tablet Generic drug: empagliflozin   latanoprost 0.005 % ophthalmic solution Commonly known as: XALATAN   levothyroxine 25 MCG tablet Commonly known as: SYNTHROID   lisinopril-hydrochlorothiazide 20-25 MG tablet Commonly known as: ZESTORETIC  multivitamin tablet   PreviDent 5000 Enamel Protect 1.1-5 % Gel Generic drug: Sod Fluoride-Potassium  Nitrate   rosuvastatin 40 MG tablet Commonly known as: CRESTOR        No Known Allergies Allergies as of 02/21/2023   No Known Allergies      Medication List     STOP taking these medications    aspirin 81 MG tablet   diltiazem 90 MG tablet Commonly known as: CARDIZEM   etodolac 400 MG tablet Commonly known as: LODINE   ezetimibe 10 MG tablet Commonly known as: ZETIA   Janumet XR 609 [id number] MG Tb24 Generic drug: SitaGLIPtin-MetFORMIN HCl   Jardiance 25 MG Tabs tablet Generic drug: empagliflozin   latanoprost 0.005 % ophthalmic solution Commonly known as: XALATAN   levothyroxine 25 MCG tablet Commonly known as: SYNTHROID   lisinopril-hydrochlorothiazide 20-25 MG tablet Commonly known as: ZESTORETIC   multivitamin tablet   PreviDent 5000 Enamel Protect 1.1-5 % Gel Generic drug: Sod Fluoride-Potassium Nitrate   rosuvastatin 40 MG tablet Commonly known as: CRESTOR        Procedures/Studies: CT CHEST ABDOMEN PELVIS W CONTRAST  Addendum Date: 02/20/2023   ADDENDUM REPORT: 02/20/2023 14:21 ADDENDUM: Critical Value/emergent results were called by telephone at the time of interpretation on 02/20/2023 at 2:20 pm to provider [facility] , who verbally acknowledged these results. Electronically Signed   By: Signa Kell M.D.   On: 02/20/2023 14:21   Result Date: 02/20/2023 CLINICAL DATA:  Metastatic disease evaluation. New finding of sepsis. History of lung cancer. * Tracking Code: BO * EXAM: CT CHEST, ABDOMEN, AND PELVIS WITH CONTRAST TECHNIQUE: Multidetector CT imaging of the chest, abdomen and pelvis was performed following the standard protocol during bolus administration of intravenous contrast. RADIATION DOSE REDUCTION: This exam was performed according to the departmental dose-optimization program which includes automated exposure control, adjustment of the mA and/or kV according to patient size and/or use of iterative reconstruction technique.  CONTRAST:  OMNIPAQUE IOHEXOL 300 MG/ML  SOLN COMPARISON:  02/17/2023 FINDINGS: CT CHEST FINDINGS Cardiovascular: Normal heart size. Aortic atherosclerosis. Coronary artery calcifications. Small pericardial effusion, unchanged. Tumor extension into the superior vena cava of rum right paratracheal adenopathy is again noted, image 21/2 and coronal image 81/5. Tumor encases the distal right main pulmonary artery with complete occlusion and likely intraluminal extension of the right upper lobe pulmonary artery. There is also complete occlusion of the right middle lobe pulmonary artery. Mass effect upon the proximal right lower lobe pulmonary artery is identified. Although suboptimal for the evaluation of pulmonary emboli there are suspicious filling defects within the distal right lower lobar pulmonary artery and proximal left lower lobar pulmonary arteries which are concerning for acute pulmonary embolus. Similarly, there is a focal filling defect within a lingular branch of the left pulmonary artery, filling defect within the right lower lobe pulmonary artery is noted, image 100/5. There is also a filling defect within a segmental branch of the left upper lobe pulmonary artery. Findings are concerning for superimposed pulmonary emboli. Mediastinum/Nodes: Bulky mediastinal and right hilar adenopathy is again seen. Index right paratracheal lymph node with mass effect upon the carina measures 5.1 x 5.5 cm, image 22/2. Formally this measured the same. Left pre vascular lymph node measures 4.3 x 3.1 cm, image 19/2. Previously 4.2 x 2.9 cm. Right hilar mass measures 6.7 cm in AP dimension. This is compared with 6.3 cm previously. Postobstructive pneumonitis within the right upper lobe is again seen and is not significantly improved from previous  exam. Lungs/Pleura: Small bilateral pleural effusions are again noted, right greater than left. These are stable to slightly increased in the interval. Right upper lobe tumor  with possible lymphangitic spread is again noted anteriorly, this measures 8.7 x 5.3 cm, image 26/2. Formally 8.6 by 5.6 cm. Pleural nodule overlying the lateral right mid lung measures 3.3 x 2.1 cm, image 26/2. Previously 3.0 by 2.0 cm. Patchy ground-glass attenuation noted within the left lung. Musculoskeletal: There is a mass encasing the body of sternum with underlying destructive bone changes measuring 6.6 x 7.0 cm, image 21/2. Formally this measured the same. CT ABDOMEN PELVIS FINDINGS Hepatobiliary: No focal liver abnormality. Mild central biliary prominence. Gallbladder appears normal. Mild increase caliber of the common bile duct measures 1 cm, similar to previous exam. Pancreas: Unremarkable. No pancreatic ductal dilatation or surrounding inflammatory changes. Spleen: Normal in size without focal abnormality. Adrenals/Urinary Tract: Normal left adrenal gland. Right adrenal mass measures 5.2 x 4.1 cm, unchanged from previous exam. Small amount of macroscopic fat and calcification noted within the posterior aspect of this lesion. 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 Mild bilateral perinephric fat stranding. Small right scratch set small bilateral kidney cysts noted. No follow-up imaging recommended. No signs of hydronephrosis. Urinary bladder is normal. Stomach/Bowel: The stomach appears within normal limits. Similar appearance of mildly increased caliber small bowel loops within the left hemiabdomen and central left lower abdomen proximal to small bowel resection site, image 94/2. Here, the small bowel loops measure up to 3.6 cm proximal to the suture line. The remaining small bowel loops are normal in caliber. Enteric contrast material is noted within the colon up to the level of the rectum. No bowel wall thickening or inflammatory change identified. Vascular/Lymphatic: Aortic atherosclerosis. There is thrombus identified within a  branch of the superior mesenteric vein, image 72/5. This has increased in size when compared with study from 12/18/2022. 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 There is patency of bilateral external and internal iliac arteries as well as the femoral or arteries. Reproductive: Status post hysterectomy. No adnexal masses. Other: Peritoneal and retroperitoneal nodularity is identified bilaterally compatible with metastatic disease. Within the left posterior retroperitoneum there is a soft tissue nodule measuring 1.8 cm, image 80/2. Previously 1.6 cm. Right lower quadrant peritoneal nodule measures 0.8 cm, image 88/2. Previously 0.7 cm. Small volume of free fluid noted within the posterior pelvis. Possible serosal metastasis to the lower rectum with increase mural soft tissue, image 104/2. No signs of pneumoperitoneum. Open ventral abdominal wound identified. Musculoskeletal: No acute abnormality. Degenerative disc disease within the lower lumbar spine. IMPRESSION: 1. 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 There is patency of bilateral external and internal iliac arteries as well as the femoral or arteries. 2. There is thrombus identified within a branch of the superior mesenteric vein. This has increased in size when compared with study from 12/18/2022. 3. Tumor extension into the superior vena cava of from right paratracheal adenopathy is again noted. Tumor encases the distal right main pulmonary artery with complete occlusion and likely intraluminal extension of the proximal right upper lobe pulmonary artery. There is also complete occlusion of the right middle lobe pulmonary artery. Mass effect upon the proximal right lower lobe pulmonary artery is identified. 4. Although suboptimal for the evaluation of pulmonary  emboli there  are suspicious filling defects within the distal right lower lobar pulmonary artery and proximal left lower lobar pulmonary arteries which are concerning for acute pulmonary embolus. Similarly, there is a focal filling defect within a branch of the left pulmonary artery. Findings are concerning for pulmonary emboli. 5. Right upper lobe tumor with possible lymphangitic spread is again noted and is not significantly changed from previous exam. 6. Similar appearance of bulky mediastinal and right hilar adenopathy. 7. Small bilateral pleural effusions, right greater than left. These are stable to slightly increased in the interval. 8. Right adrenal mass measures 5.2 x 4.1 cm, unchanged from previous exam. 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 9. Similar appearance of mildly increased caliber small bowel loops within the left hemiabdomen and central left lower abdomen proximal to small bowel resection site. 10. Possible serosal metastasis to the lower rectum with increase mural soft tissue. 11. Similar size of right scapular metastasis with underlying destructive changes of the scapular body. 12.  Aortic Atherosclerosis (ICD10-I70.0). Electronically Signed: By: Signa Kell M.D. On: 02/20/2023 14:15   CT HEAD WO CONTRAST ( )  Result Date: 02/20/2023 CLINICAL DATA:  72 year old female with lower extremity numbness and burning. Neurologic deficit. Metastatic lung cancer. EXAM: CT HEAD WITHOUT CONTRAST TECHNIQUE: Contiguous axial images were obtained from the base of the skull through the vertex without intravenous contrast. RADIATION DOSE REDUCTION: This exam was performed according to the departmental dose-optimization program which includes automated exposure control, adjustment of the mA and/or kV according to patient size and/or use of iterative reconstruction technique. COMPARISON:  Head CT 02/08/2023. FINDINGS: Brain: Cerebral volume is within  normal limits for age. No midline shift, ventriculomegaly, mass effect, evidence of mass lesion, intracranial hemorrhage or evidence of cortically based acute infarction. Gray-white matter differentiation is stable and within normal limits for age. Tiny chronic left cerebellar infarct better demonstrated by MRI last month. Vascular: Calcified atherosclerosis at the skull base. No suspicious intracranial vascular hyperdensity. Skull: No acute or suspicious osseous lesion identified. Sinuses/Orbits: Visualized paranasal sinuses and mastoids are clear. Other: No acute orbit or scalp soft tissue finding. IMPRESSION: No acute intracranial abnormality. Stable and negative for age noncontrast CT appearance of the brain. Electronically Signed   By: Odessa Fleming M.D.   On: 02/20/2023 13:38   US Venous Img Lower Bilateral (DVT)  Result Date: 02/20/2023 CLINICAL DATA:  Bilateral lower extremity edema EXAM: BILATERAL LOWER EXTREMITY VENOUS DOPPLER ULTRASOUND TECHNIQUE: Gray-scale sonography with graded compression, as well as color Doppler and duplex ultrasound were performed to evaluate the lower extremity deep venous systems from the level of the common femoral vein and including the common femoral, femoral, profunda femoral, popliteal and calf veins including the posterior tibial, peroneal and gastrocnemius veins when visible. The superficial great saphenous vein was also interrogated. Spectral Doppler was utilized to evaluate flow at rest and with distal augmentation maneuvers in the common femoral, femoral and popliteal veins. COMPARISON:  None Available. FINDINGS: RIGHT LOWER EXTREMITY Common Femoral Vein: No evidence of thrombus. Normal compressibility, respiratory phasicity and response to augmentation. Saphenofemoral Junction: No evidence of thrombus. Normal compressibility and flow on color Doppler imaging. Profunda Femoral Vein: No evidence of thrombus. Normal compressibility and flow on color Doppler imaging.  Femoral Vein: No evidence of thrombus. Normal compressibility, respiratory phasicity and response to augmentation. Popliteal Vein: No evidence of thrombus. Normal compressibility, respiratory phasicity and response to augmentation. Calf Veins: No evidence of thrombus. Normal compressibility  and flow on color Doppler imaging. Superficial Great Saphenous Vein: No evidence of thrombus. Normal compressibility. Venous Reflux:  None. Other Findings:  None. LEFT LOWER EXTREMITY Common Femoral Vein: No evidence of thrombus. Normal compressibility, respiratory phasicity and response to augmentation. Saphenofemoral Junction: No evidence of thrombus. Normal compressibility and flow on color Doppler imaging. Profunda Femoral Vein: No evidence of thrombus. Normal compressibility and flow on color Doppler imaging. Femoral Vein: No evidence of thrombus. Normal compressibility, respiratory phasicity and response to augmentation. Popliteal Vein: No evidence of thrombus. Normal compressibility, respiratory phasicity and response to augmentation. Calf Veins: No evidence of thrombus. Normal compressibility and flow on color Doppler imaging. Superficial Great Saphenous Vein: No evidence of thrombus. Normal compressibility. Venous Reflux:  None. Other Findings:  None. IMPRESSION: No evidence of deep venous thrombosis in either lower extremity. Electronically Signed   By: Malachy Moan M.D.   On: 02/20/2023 10:44   DG CHEST PORT 1 VIEW  Result Date: 02/20/2023 CLINICAL DATA:  72 year old female with history of tachypnea. EXAM: PORTABLE CHEST 1 VIEW COMPARISON:  Chest x-ray 02/12/2023. FINDINGS: Previously noted nasogastric tube has been removed. Persistent right upper lobe airspace consolidation. Left lung is clear. No definite pleural effusions. No pneumothorax. No evidence of pulmonary edema. Heart size is normal. Upper mediastinal contours are grossly distorted. IMPRESSION: 1. Persistent right upper lobe pneumonia related to  obstructing right hilar mass, with mediastinal lymphadenopathy, similar to prior study. Electronically Signed   By: Trudie Reed M.D.   On: 02/20/2023 05:56   DG Abd 1 View  Result Date: 02/19/2023 CLINICAL DATA:  Abdominal distension.  Constipation. EXAM: ABDOMEN - 1 VIEW COMPARISON:  CT of the abdomen and pelvis 02/17/2023. FINDINGS: Single supine view of the abdomen demonstrates a stable nonobstructive bowel gas pattern. There is a small amount of residual contrast within the right colon. No evidence of significant constipation. Midline skin staples are noted. There are stable degenerative changes in the spine. IMPRESSION: No evidence of bowel obstruction or significant constipation. Electronically Signed   By: Carey Bullocks M.D.   On: 02/19/2023 14:29   CT CHEST ABDOMEN PELVIS W CONTRAST  Result Date: 02/17/2023 CLINICAL DATA:  Metastatic lung cancer. Progressive leukocytosis. * Tracking Code: BO * EXAM: CT CHEST, ABDOMEN, AND PELVIS WITH CONTRAST TECHNIQUE: Multidetector CT imaging of the chest, abdomen and pelvis was performed following the standard protocol during bolus administration of intravenous contrast. RADIATION DOSE REDUCTION: This exam was performed according to the departmental dose-optimization program which includes automated exposure control, adjustment of the mA and/or kV according to patient size and/or use of iterative reconstruction technique. CONTRAST:  OMNIPAQUE IOHEXOL 300 MG/ML  SOLN COMPARISON:  CT chest 02/12/2023, CT abdomen pelvis 02/10/2023 FINDINGS: CT CHEST FINDINGS Cardiovascular: Moderate multi-vessel coronary artery calcification. Global cardiac size is within normal limits. No pericardial effusion. Bulky right hilar and right paratracheal adenopathy demonstrates intravascular nodular extension into the superior vena cava, best seen on image # 20/2 and 82/4 and right pulmonary artery, image # 24/2 and 95/4. This appears new since prior examination.  Additionally, mass effect versus invasion of the superior vena cava related to right hilar adenopathy obliterates the azygous arch and demonstrates marked narrowing of the superior vena cava, best seen on image # 25/2 and progressive since prior examination. Left upper lobar pulmonary artery is obliterated. The thoracic aorta is unremarkable. Mediastinum/Nodes: Bulky mediastinal and right hilar adenopathy appears grossly stable. Nasogastric tube extends into the mid body of the stomach. Esophagus unremarkable. Multiple thyroid  nodules again identified, better assessed on sonogram of 06/17/2020 Lungs/Pleura: There is dense multifocal nodular consolidation within the right upper lobe. Superimposed ground-glass and airspace infiltrate peripherally within the right upper lobe is again seen in keeping with postobstructive pneumonitis or, in the appropriate clinical setting, radiation pneumonitis. Obliteration of the a right upper lobe are pulmonary bronchus again noted. Small right pleural effusion is present, slightly enlarged since prior examination. Trace left pleural effusion is present, new since prior examination. No new focal pulmonary infiltrates. No pneumothorax. Musculoskeletal: No acute bone abnormality. No lytic or blastic bone lesion. CT ABDOMEN PELVIS FINDINGS Hepatobiliary: Liver is unremarkable. Gallbladder unremarkable. No intra or extrahepatic biliary ductal dilation. Pancreas: Pathologic aortocaval and periportal adenopathy is again identified resulting mass effect upon the pancreatic head. The pancreas is otherwise unremarkable. Spleen: Normal in size without focal abnormality. 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 Left adrenal gland is unremarkable. The kidneys are normal in size and position. Multiple pararenal soft tissue nodules are again seen bilaterally suspicious  for soft tissue metastases, similar to prior examination. No hydronephrosis. The bladder is unremarkable. Stomach/Bowel: Interval small-bowel resection with anastomotic staple line noted within the mid pelvis. No evidence of obstruction. No free intraperitoneal gas. Trace free fluid within the pelvis. Appendix normal. Vascular/Lymphatic: Extensive aortoiliac atherosclerotic calcification. No aortic aneurysm. Pathologic periportal, peripancreatic/pericaval, and mesenteric adenopathy is again identified and appears stable. No new pathologic adenopathy. Reproductive: Urethral diverticulum again noted. Uterus absent. Interval development of a a loculated 2.4 x 2.8 cm para ovarian collection abutting the left ovary, possibly postsurgical in nature. Right ovary is unremarkable. Other: Superficial dehiscence of the laparotomy incision. Small fat containing inguinal hernia. Musculoskeletal: No acute bone abnormality. No lytic or blastic bone lesion. IMPRESSION: 1. Interval small-bowel resection with anastomotic staple line within the mid pelvis. No evidence of obstruction. 2. Interval development of intravascular nodular extension of the right hilar and right paratracheal adenopathy into the superior vena cava and right pulmonary artery. Progressive mass effect versus invasion of the superior vena cava related to right hilar adenopathy obliterates the azygous arch and demonstrates marked narrowing of the superior vena cava, progressive since prior examination. 3. Multifocal nodular consolidation within the right upper lobe with superimposed ground-glass and airspace infiltrate in keeping with postobstructive pneumonitis or, in the appropriate clinical setting, radiation pneumonitis. 4. Small right pleural effusion, slightly enlarged since prior examination. Trace left pleural effusion, new since prior examination. 5. 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 6. Multiple pararenal soft tissue nodules  bilaterally suspicious for soft tissue metastases, similar to prior examination. 7. Pathologic periportal, peripancreatic/pericaval, and mesenteric adenopathy, similar to prior examination. 8. Interval development of a loculated 2.4 x 2.8 cm collection abutting the left ovary, possibly postsurgical in nature. 9. Superficial dehiscence of the laparotomy incision. Electronically Signed   By: Helyn Numbers M.D.   On: 02/17/2023 18:26   DG Chest Port 1 View  Result Date: 02/12/2023 CLINICAL DATA:  Nasogastric tube placement. EXAM: PORTABLE CHEST 1 VIEW COMPARISON:  CT earlier today.  Chest radiograph 02/08/2023 FINDINGS: Tip and side port of the enteric tube below the diaphragm in the stomach. Dense right upper lobe opacity as characterized on CT earlier today. Slight gaseous colonic distention in the upper abdomen. IMPRESSION: Tip and side port of the enteric tube below the diaphragm in the stomach. Electronically Signed   By: Ivette Loyal.D.  On: 02/12/2023 22:22   CT ABDOMEN PELVIS WO CONTRAST  Addendum Date: 02/12/2023   ADDENDUM REPORT: 02/12/2023 16:35 ADDENDUM: Clarification, call was made to the ordering service at 1:10 p.m. [place] standard time or 4:10 pm [place] standard time on 02/12/2023 Electronically Signed   By: Karen Kays M.D.   On: 02/12/2023 16:35   Result Date: 02/12/2023 CLINICAL DATA:  Nonlocalized abdominal pain. EXAM: CT ABDOMEN AND PELVIS WITHOUT CONTRAST TECHNIQUE: Multidetector CT imaging of the abdomen and pelvis was performed following the standard protocol without IV contrast. RADIATION DOSE REDUCTION: This exam was performed according to the departmental dose-optimization program which includes automated exposure control, adjustment of the mA and/or kV according to patient size and/or use of iterative reconstruction technique. COMPARISON:  Separate CT angiogram chest same day 02/12/2023. Abdomen pelvis CT with contrast 2 days ago 02/10/2023 FINDINGS: Lower chest: Please  see separate chest CT from same day. Hepatobiliary: On this non IV contrast exam, grossly the liver is preserved. Mildly distended gallbladder with some high density material in this location, possible vicarious excretion of previous contrast versus sludge or stones. Pancreas: Unremarkable. No pancreatic ductal dilatation or surrounding inflammatory changes. Spleen: Normal in size without focal abnormality. Adrenals/Urinary Tract: Left adrenal gland is preserved. 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 With a heterogeneous appearance however in the history of known malignancy a collision lesion is not excluded. Recommend continued follow up or workup. No abnormal calcifications are seen within either kidney nor along the course of either ureter. Duplicated appearance of the left kidney. Nonspecific perinephric stranding there is also some nodularity in the perinephric spaces as described previously example on the left caudal and lateral to the kidney measures up to 18 mm. Focus on the right posteriorly measures 11 mm. Preserved contours of the urinary bladder. Stomach/Bowel: Stomach is nondilated. Small bowel has several fluid-filled nondilated loops in the abdomen and central pelvis. Loops in the pelvis have wall thickening and some stranding which is new from previous. There are some bubbles of free air identified in the abdomen. Normal appendix. Large bowel has a normal course and caliber. Slight wall thickening in the area of the cecum. This new from previous as well Vascular/Lymphatic: Diffuse vascular calcifications. Normal caliber aorta and IVC. Several abnormal lymph nodes are identified there is seen on the contrast examination. Examples again seen anterior to the IVC, central mesentery. Reproductive: Status post hysterectomy. No adnexal masses. Other: Scattered areas of free air identified. Trace free fluid in the pelvis.  Anasarca. Mesenteric stranding. Scattered peritoneal and retroperitoneal nodules identified. Musculoskeletal: Degenerative changes identified along the spine and pelvis. Areas of disc bulging along the lumbar spine with stenosis particularly at L4-5. IMPRESSION: Interval development of stranding, ill-defined fluid and some free air. There are several loops of small bowel in the central pelvis which has some wall thickening. There is also some thickening along the cecum. Adjacent fluid and stranding. Please correlate for any intervention otherwise there is a differential including possibility of bowel perforation. There is seen on the prior contrast examination there is presence of abnormal lymph nodes, peritoneal and mesenteric nodules worrisome for neoplasm. Right adrenal mass again identified which very well could be a myelolipoma although with the heterogeneity of the lesion in the other findings a collision tumor is possible. Further workup when appropriate. Please see separate dictation of CT angiogram of the chest from same day Critical Value/emergent results were called by telephone at  the time of interpretation on 02/12/2023 at 1:10 pm to provider DAVID TAT , who verbally acknowledged these results. Electronically Signed: By: Karen Kays M.D. On: 02/12/2023 16:14   CT Angio Chest Pulmonary Embolism (PE) W or WO Contrast  Result Date: 02/12/2023 CLINICAL DATA:  Chest pain, shortness of breath and elevated D-dimer. Metastatic lung cancer. EXAM: CT ANGIOGRAPHY CHEST WITH CONTRAST TECHNIQUE: Multidetector CT imaging of the chest was performed using the standard protocol during bolus administration of intravenous contrast. Multiplanar CT image reconstructions and MIPs were obtained to evaluate the vascular anatomy. RADIATION DOSE REDUCTION: This exam was performed according to the departmental dose-optimization program which includes automated exposure control, adjustment of the mA and/or kV according to  patient size and/or use of iterative reconstruction technique. CONTRAST:  75mL OMNIPAQUE IOHEXOL 350 MG/ML SOLN COMPARISON:  02/08/2023.  Abdomen and pelvis CT dated 02/10/2023. FINDINGS: Cardiovascular: A large right hilar/right upper lobe mass is again occluding the right upper lobe pulmonary artery and causing stenosis of the proximal right middle lobe and right lower lobe pulmonary arteries. This is mildly progressive compression of the superior vena cava with marked narrowing inferiorly. No filling defects in the opacified pulmonary arteries. Mild atheromatous aortic calcifications. The heart remains mildly enlarged with a small pericardial effusion measuring 8 mm in thickness. Mediastinum/Nodes: The right lobe of the thyroid gland remains diffusely enlarged and heterogeneous suggesting the presence of a 3.1 cm mass. Proximally 50% stenosis of the proximal right mainstem bronchus by the large right hilar/upper lobe mass invading the mediastinum. This is also causing mild narrowing of the proximal left mainstem bronchus and narrowing of the proximal right middle lobe and right lower lobe bronchi. Unremarkable esophagus. Multiple enlarged mediastinal nodes and masses without significant change. Lungs/Pleura: The large right hilar/upper lobe mass continues to cause compression and complete obstruction of the right upper lobe bronchus with associated postobstructive airspace opacity. The airspace opacity has become somewhat more confluence posteriorly on the right. Stable right hilar/upper lobe mass and right upper lobe subpleural masses. Interval small right pleural effusion. Upper Abdomen: Stable large low-density and partially calcified right adrenal mass. Stable partially included small simple appearing left renal cyst. The renal cyst does not need imaging follow-up. Musculoskeletal: Increased size of the right scapular mass with increased bone destruction. This mass is poorly defined and measures approximately  10.1 x 5.7 cm on image number 29/4, previously approximately 5.7 x 3.9 cm. Review of the MIP images confirms the above findings. IMPRESSION: 1. No pulmonary emboli. 2. Mildly progressive compression of the superior vena cava by the large right hilar/right upper lobe mass. 3. Stable large right hilar/right upper lobe mass with associated occlusion of the right upper lobe bronchus and narrowing of the proximal right middle lobe and right lower lobe bronchi with associated progressive postobstructive airspace opacity. This also continues to cause occlusion of the right upper lobe pulmonary artery and narrowing of the proximal right middle lobe and right lower lobe pulmonary arteries. 4. Stable mediastinal adenopathy and masses. 5. Interval small right pleural effusion. 6. Increased size of the right scapular mass with increased bone destruction. 7. Stable indeterminate large right adrenal mass. 8. Stable mild cardiomegaly with a small pericardial effusion. 9. Aortic atherosclerosis. Aortic Atherosclerosis (ICD10-I70.0). Electronically Signed   By: Beckie Salts M.D.   On: 02/12/2023 14:07   CT BONE TROCAR/NEEDLE BIOPSY DEEP  Result Date: 02/11/2023 INDICATION: [id number] Lung cancer (HCC) [id number] RIGHT scapular mass. EXAM: CT-GUIDED RIGHT SCAPULAR MASS CORE BIOPSY COMPARISON:  CTA  chest, 02/08/2023. MEDICATIONS: None. ANESTHESIA/SEDATION: Moderate (conscious) sedation was employed during this procedure. A total of Versed 2 mg and Fentanyl 150 mcg was administered intravenously. Moderate Sedation Time: 21 minutes. The patient's level of consciousness and vital signs were monitored continuously by radiology nursing throughout the procedure under my direct supervision. CONTRAST:  None. COMPLICATIONS: None immediate. PROCEDURE: RADIATION DOSE REDUCTION: This exam was performed according to the departmental dose-optimization program which includes automated exposure control, adjustment of the mA and/or kV according to patient  size and/or use of iterative reconstruction technique. Informed consent was obtained from the patient following an explanation of the procedure, risks, benefits and alternatives. A time out was performed prior to the initiation of the procedure. The patient was positioned prone on the CT table and a limited CT was performed for procedural planning demonstrating lytic RIGHT scapular mass. The procedure was planned. The operative site was prepped and draped in the usual sterile fashion. Appropriate trajectory was confirmed with a 22 gauge spinal needle after the adjacent tissues were anesthetized with 1% Lidocaine with epinephrine. Under intermittent CT guidance, a 17 gauge coaxial needle was advanced into the peripheral aspect of the mass. Appropriate positioning was confirmed and 2 samples were obtained with an 18 gauge core needle biopsy device. The co-axial needle was removed and hemostasis was achieved with manual compression. A limited postprocedural CT was negative for hemorrhage or additional complication. A dressing was placed. The patient tolerated the procedure well without immediate postprocedural complication. IMPRESSION: Successful CT guided core needle biopsy of RIGHT scapular mass. Roanna Banning, MD [facility] Electronically Signed   By: Roanna Banning M.D.   On: 02/11/2023 16:32   CT ABDOMEN PELVIS W CONTRAST  Result Date: 02/10/2023 CLINICAL DATA:  Prominent intrathoracic malignancy especially involving the right upper lobe and mediastinum, inpatient assessment for staging workup. * Tracking Code: BO * EXAM: CT ABDOMEN AND PELVIS WITH CONTRAST TECHNIQUE: Multidetector CT imaging of the abdomen and pelvis was performed using the standard protocol following bolus administration of intravenous contrast. RADIATION DOSE REDUCTION: This exam was performed according to the departmental dose-optimization program which includes automated exposure  control, adjustment of the mA and/or kV according to patient size and/or use of iterative reconstruction technique. CONTRAST:  75mL OMNIPAQUE IOHEXOL 300 MG/ML  SOLN COMPARISON:  CT chest 02/08/2023 FINDINGS: Lower chest: Small right pleural effusion. Small pericardial effusion. Right coronary artery atheromatous vascular calcifications and left anterior descending coronary atherosclerosis. Hepatobiliary: Unremarkable Pancreas: Obscuration of the pancreatic head due to adjacent extensive adenopathy. I do not see a definite mass arising from the pancreatic head although the region is indistinct and difficult to characterize. No dorsal pancreatic duct dilatation. Spleen: Unremarkable Adrenals/Urinary Tract: Mixed density right adrenal mass measuring 5.2 by 3.9 cm with some internal calcifications and also internal macroscopic fat. 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 Left adrenal gland unremarkable. Urinary bladder and ureters unremarkable. Soft tissue nodule in the right posterior perirenal space 1.1 by 1.0 cm, possibly a metastatic lesion. Bilateral small nodules in the perirenal space suspicious for metastases occluding a 5 mm posterior left perirenal space nodule on image 35 series 3 and a 1.6 by 1.3 cm lower perirenal space nodule on image 62 series 6. Other small  perirenal space nodules are present bilaterally. Small hypodense lesions of the right kidney are probably cysts although technically too small to characterize. No further imaging workup of these lesions is indicated. Suspected periurethral diverticulum on image 89 series 3. Stomach/Bowel: Unremarkable Vascular/Lymphatic: Atherosclerosis is present, including aortoiliac atherosclerotic disease. Porta  hepatis/peripancreatic node 2.6 cm in short axis on image 33 series 3. Aortocaval node along the posterior margin of the pancreatic head measures 2.7 cm in short axis on image 38 series 3. Peripancreatic lymph node on image 34 series 3 measures 1.2 cm short axis. Central mesenteric node on image 58 series 3 measures 1.7 cm in short axis. Adjacent central mesenteric lymph node on the same image measures 0.2 cm in short axis. Reproductive: Uterus absent.  Adnexa unremarkable. Other: Subcutaneous nodule along the left upper quadrant anteriorly, 0.5 cm in diameter on image 22 series 3. This is nonspecific. 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 IMPRESSION: 1. Extensive adenopathy in the porta hepatis/peripancreatic region, retroperitoneum, and central mesentery, compatible with metastatic disease. 2. Mixed density right adrenal mass with internal macroscopic fat and some internal calcifications. 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 3. 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 4. Small right pleural effusion. 5. Small pericardial effusion. 6. Coronary atherosclerosis. 7. 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 8. 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 9. Aortic and coronary atherosclerosis. Aortic Atherosclerosis (ICD10-I70.0). Electronically Signed   By: Gaylyn Rong M.D.   On: 02/10/2023 18:48   CT Angio Chest PE W/Cm &/Or Wo Cm  Result Date: 02/08/2023 CLINICAL DATA:  Concern for pulmonary edema. Right upper lobe pneumonia/mass. EXAM: CT ANGIOGRAPHY CHEST WITH CONTRAST TECHNIQUE:  Multidetector CT imaging of the chest was performed using the standard protocol during bolus administration of intravenous contrast. Multiplanar CT image reconstructions and MIPs were obtained to evaluate the vascular anatomy. RADIATION DOSE REDUCTION: This exam was performed according to the departmental dose-optimization program which includes automated exposure control, adjustment of the mA and/or kV according to patient size and/or use of iterative reconstruction technique. CONTRAST:  75mL OMNIPAQUE IOHEXOL 350 MG/ML SOLN COMPARISON:  Chest radiograph dated 02/08/2023. FINDINGS: Evaluation of this exam is limited due to respiratory motion artifact. Cardiovascular: Top-normal cardiac size. Small pericardial effusion. Mild atherosclerotic calcification of the thoracic aorta. No aneurysmal dilatation or dissection. The origins of the great vessels of the aortic arch appear patent. Evaluation of the pulmonary arteries is limited due to respiratory motion. No pulmonary artery embolus identified. Mediastinum/Nodes: Right hilar mass/adenopathy measures approximately 5 x 7 cm. There is a large area of consolidation in the right upper lobe representing a combination of mass and post obstructive atelectasis or pneumonia. There is extension of the mass into the right apex with compression of the SVC. There is mass effect and compression of the right pulmonary artery with near complete occlusion of the right upper lobe pulmonary artery and high-grade narrowing of the right middle and right lower lobe pulmonary artery branches. Apparent protrusion of the lobulated mass into the right pulmonary artery suggestive of vascular invasion and tumor thrombus. Additional mediastinal adenopathy in the anterior mediastinum and prevascular space. The esophagus is grossly unremarkable. Asymmetric enlargement and heterogeneity  of the right thyroid lobe. This has been evaluated on previous imaging. (ref: J Am Coll Radiol. 2015 Feb;12(2):  143-50).No mediastinal fluid collection. Lungs/Pleura: Consolidative changes of the majority of the right upper lobe as above, combination of malignancy and postobstructive atelectasis/infiltrate. There is mass effect and complete compression of the right upper lobe bronchus. Additional lobulated subpleural mass in the right upper lobe measures approximately 2 x 4 cm. The left lung is clear. There is no pleural effusion or pneumothorax. There is mild compression of the right mainstem bronchus. The central airways remain patent. Upper Abdomen: Fatty liver. Indeterminate 3.5 x 5.0 cm right adrenal mass as well as partially visualized 2.7 x 2.4 cm hypodense mass in the uncinate process of the pancreas. Further evaluation of the abdominal masses with MRI without and with contrast on a nonemergent/outpatient basis recommended. Musculoskeletal: There is infiltrative mass involving the inferior right scapula with associated pathologic fracture. Osteopenia with degenerative changes of the spine. Review of the MIP images confirms the above findings. IMPRESSION: 1. No CT evidence of pulmonary embolism. 2. 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 Associated complete obstruction of the right upper lobe bronchus and compression of the SVC and invasion into the right pulmonary artery. 3. Mediastinal adenopathy. 4. Infiltrative mass involving the inferior right scapula with associated pathologic fracture. 5. Indeterminate right adrenal mass and partially visualized hypodense mass in the uncinate process of the pancreas. Further evaluation with MRI without and with contrast on a nonemergent/outpatient basis recommended. 6.  Aortic Atherosclerosis (ICD10-I70.0). Electronically Signed   By: Elgie Collard M.D.   On: 02/08/2023 22:23   MR Brain Wo Contrast (neuro protocol)  Result Date: 02/08/2023 CLINICAL DATA:  Slurred speech, nausea and vomiting EXAM: MRI HEAD WITHOUT CONTRAST MRA HEAD WITHOUT  CONTRAST TECHNIQUE: Multiplanar, multi-echo pulse sequences of the brain and surrounding structures were acquired without intravenous contrast. Angiographic images of the Circle of Willis were acquired using MRA technique without intravenous contrast. COMPARISON:  None Available. FINDINGS: MRI HEAD FINDINGS Brain: No acute infarct, mass effect or extra-axial collection. No chronic microhemorrhage or siderosis. Normal white matter signal, parenchymal volume and CSF spaces. The midline structures are normal. 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 Vascular: Normal flow voids. Skull and upper cervical spine: Normal marrow signal. Sinuses/Orbits: No acute or significant finding. Other: None. MRA HEAD FINDINGS POSTERIOR CIRCULATION: --Vertebral arteries: Normal --Inferior cerebellar arteries: Normal. --Basilar artery: Normal. --Superior cerebellar arteries: Normal. --Posterior cerebral arteries: Normal. ANTERIOR CIRCULATION: --Intracranial internal carotid arteries: Normal. --Anterior cerebral arteries (ACA): Normal. --Middle cerebral arteries (MCA): Normal. Anatomic variants: None IMPRESSION: 1. No acute intracranial abnormality. 2. 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 3. Normal intracranial MRA. Electronically Signed   By: Deatra Robinson M.D.   On: 02/08/2023 21:45   MR Angio Head WO CONTRAST  Result Date: 02/08/2023 CLINICAL DATA:  Slurred speech, nausea and vomiting EXAM: MRI HEAD WITHOUT CONTRAST MRA HEAD WITHOUT CONTRAST TECHNIQUE: Multiplanar, multi-echo pulse sequences of the brain and surrounding structures were acquired without intravenous contrast. Angiographic images of the Circle of Willis were acquired using MRA technique without intravenous contrast. COMPARISON:  None Available. FINDINGS: MRI HEAD FINDINGS Brain: No acute infarct, mass effect or extra-axial collection. No chronic microhemorrhage or siderosis. Normal white matter signal, parenchymal volume and CSF spaces. The midline structures  are normal. 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 Vascular: Normal flow voids. Skull and upper cervical spine: Normal marrow signal. Sinuses/Orbits: No acute or significant finding. Other: None. MRA HEAD FINDINGS  POSTERIOR CIRCULATION: --Vertebral arteries: Normal --Inferior cerebellar arteries: Normal. --Basilar artery: Normal. --Superior cerebellar arteries: Normal. --Posterior cerebral arteries: Normal. ANTERIOR CIRCULATION: --Intracranial internal carotid arteries: Normal. --Anterior cerebral arteries (ACA): Normal. --Middle cerebral arteries (MCA): Normal. Anatomic variants: None IMPRESSION: 1. No acute intracranial abnormality. 2. 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 3. Normal intracranial MRA. Electronically Signed   By: Deatra Robinson M.D.   On: 02/08/2023 21:45   DG Chest Port 1 View  Result Date: 02/08/2023 CLINICAL DATA:  Shortness of breath EXAM: PORTABLE CHEST 1 VIEW COMPARISON:  None Available. FINDINGS: Low lung volumes. Borderline cardiomegaly. Dense right upper lobe opacity. No pneumothorax or pleural effusion IMPRESSION: Low lung volumes. Dense right upper lobe opacity, possible pneumonia though neoplasm could also produce this appearance. Consider correlation with chest CT Electronically Signed   By: Jasmine Pang M.D.   On: 02/08/2023 19:14   DG Shoulder Right  Result Date: 02/08/2023 CLINICAL DATA:  Right shoulder pain for 2 weeks EXAM: RIGHT SHOULDER - 2+ VIEW COMPARISON:  None Available. FINDINGS: Mild AC joint and glenohumeral degenerative change. No fracture or malalignment. Partially visualized right upper lobe opacity IMPRESSION: 1. Mild degenerative change. 2. Partially visualized right upper lobe opacity, recommend chest radiograph. Electronically Signed   By: Jasmine Pang M.D.   On: 02/08/2023 19:13   CT HEAD CODE STROKE WO CONTRAST  Result Date: 02/08/2023 CLINICAL DATA:  Code stroke. Neuro deficit, acute, stroke suspected. EXAM: CT HEAD WITHOUT CONTRAST TECHNIQUE:  Contiguous axial images were obtained from the base of the skull through the vertex without intravenous contrast. RADIATION DOSE REDUCTION: This exam was performed according to the departmental dose-optimization program which includes automated exposure control, adjustment of the mA and/or kV according to patient size and/or use of iterative reconstruction technique. COMPARISON:  None. FINDINGS: Brain: Cerebral volume is normal. There is no acute intracranial hemorrhage. No demarcated cortical infarct. No extra-axial fluid collection. No evidence of an intracranial mass. No midline shift. Vascular: No hyperdense vessel.  Atherosclerotic calcifications. Skull: No calvarial fracture or aggressive osseous lesion. Sinuses/Orbits: No mass or acute finding within the imaged orbits. No significant paranasal sinus disease at the imaged levels. ASPECTS [facility] Stroke Program Early CT Score) - Ganglionic level infarction (caudate, lentiform nuclei, internal capsule, insula, M1-M3 cortex): 7 - Supraganglionic infarction (M4-M6 cortex): 3 Total score (0-10 with 10 being normal): 10 No evidence of an acute intracranial abnormality. These results were called by telephone at the time of interpretation on 02/08/2023 at 5:07 pm to provider Vanetta Mulders , who verbally acknowledged these results. IMPRESSION: No evidence of an acute intracranial abnormality. Electronically Signed   By: Jackey Loge D.O.   On: 02/08/2023 17:08     Discharge Exam: Vitals:   02/20/23 1403 02/21/23 0546  BP: (!) 140/90 (!) 160/88  Pulse: (!) 125 (!) 121  Resp: (!) 30 (!) 25  Temp: 97.9 F (36.6 C) 97.8 F (36.6 C)  SpO2: 97% 95%   Vitals:   02/20/23 0620 02/20/23 0726 02/20/23 1403 02/21/23 0546  BP: (!) 154/93 (!) 154/87 (!) 140/90 (!) 160/88  Pulse: (!) 121 (!) 125 (!) 125 (!) 121  Resp: (!) 26 (!) 33 (!) 30 (!) 25  Temp: 97.6 F (36.4 C) 98.2 F (36.8 C) 97.9 F (36.6 C) 97.8 F (36.6 C)  TempSrc: Oral Oral Oral   SpO2: 100%  100% 97% 95%  Weight:      Height:        General: Pt appears comfortable in no  distress, receiving full comfort measures.     The results of significant diagnostics from this hospitalization (including imaging, microbiology, ancillary and laboratory) are listed below for reference.     Microbiology: Recent Results (from the past 240 hour(s))  Aerobic/Anaerobic Culture w Gram Stain (surgical/deep wound)     Status: None   Collection Time: 02/12/23  6:25 PM   Specimen: Path fluid; Body Fluid  Result Value Ref Range Status   Specimen Description   Final    FLUID Performed at [facility], [address] [place], [place] [address]    Special Requests   Final    NONE Performed at [facility], [address] [place], [place] [address]    Gram Stain   Final    MODERATE WBC PRESENT, PREDOMINANTLY PMN RARE BUDDING YEAST SEEN RARE GRAM POSITIVE RODS Performed at [facility], [address] [place], [place] [address]    Culture   Final    RARE STREPTOCOCCUS VESTIBULARIS RARE ACTINOMYCES NAESLUNDII RARE VEILLONELLA SPECIES    Report Status 02/20/2023 FINAL  Final   Organism ID, Bacteria STREPTOCOCCUS VESTIBULARIS  Final      Susceptibility   Streptococcus vestibularis - MIC*    PENICILLIN 1 INTERMEDIATE Intermediate     CEFTRIAXONE 1 SENSITIVE Sensitive     ERYTHROMYCIN 2 RESISTANT Resistant     LEVOFLOXACIN 1 SENSITIVE Sensitive     VANCOMYCIN 1 SENSITIVE Sensitive     * RARE STREPTOCOCCUS VESTIBULARIS  MRSA Next Gen by PCR, Nasal     Status: None   Collection Time: 02/16/23  8:16 AM   Specimen: Nasal Mucosa; Nasal Swab  Result Value Ref Range Status   MRSA by PCR Next Gen NOT DETECTED NOT DETECTED Final    Comment: (NOTE) The GeneXpert MRSA Assay (FDA approved for NASAL specimens only), is one component of a comprehensive MRSA colonization surveillance program. It is not intended to diagnose MRSA infection nor to guide or monitor treatment for MRSA  infections. Test performance is not FDA approved in patients less than 43 years old. Performed at [facility], [address], [place], [place] [address]      Labs: BNP (last 3 results) No results for input(s): "BNP" in the last 8760 hours. Basic Metabolic Panel: Recent Labs  Lab 02/15/23 0442 02/16/23 0425 02/17/23 0407 02/18/23 0412 02/19/23 0414 02/20/23 0412  NA 135 133* 133* 132* 134* 135  K 3.1* 3.4* 3.1* 2.8* 3.2* 3.6  CL 100 98 98 100 102 107  CO2 25 23 20* 22 21* 19*  GLUCOSE 101* 64* 73 128* 123* 178*  BUN 14 8 6* 5* <5* 5*  CREATININE 0.58 0.60 0.62 0.51 0.57 0.58  CALCIUM 8.4* 8.4* 8.2* 8.1* 8.3* 8.3*  MG 2.0 2.2 2.0 2.0  --   --   PHOS 2.4*  --  2.7  --   --   --    Liver Function Tests: Recent Labs  Lab 02/17/23 0407 02/18/23 0412 02/20/23 0412  AST 20 21 33  ALT 13 15 20   ALKPHOS 185* 177* 271*  BILITOT 0.9 0.7 1.0  PROT 6.1* 5.8* 6.2*  ALBUMIN 2.0* 1.9* 2.1*   No results for input(s): "LIPASE", "AMYLASE" in the last 168 hours. No results for input(s): "AMMONIA" in the last 168 hours. CBC: Recent Labs  Lab 02/15/23 0442 02/16/23 0425 02/17/23 0407 02/18/23 0412 02/20/23 0412  WBC 46.1* 50.9* 52.4* 48.1* 55.5*  NEUTROABS 42.0* 42.2* 47.7* 41.1*  --   HGB 8.1* 8.2*  8.2* 8.1* 7.9*  HCT 26.1* 26.9* 25.8* 24.5* 24.7*  MCV 82.6 82.5 82.2 79.3* 81.3  PLT 352 311 152 92* 38*   Cardiac Enzymes: No results for input(s): "CKTOTAL", "CKMB", "CKMBINDEX", "TROPONINI" in the last 168 hours. BNP: Invalid input(s): "POCBNP" CBG: Recent Labs  Lab 02/16/23 0627 02/16/23 2033 02/20/23 0358  GLUCAP 77 100* 166*   D-Dimer No results for input(s): "DDIMER" in the last 72 hours. Hgb A1c No results for input(s): "HGBA1C" in the last 72 hours. Lipid Profile No results for input(s): "CHOL", "HDL", "LDLCALC", "TRIG", "CHOLHDL", "LDLDIRECT" in the last 72 hours. Thyroid function studies No results for input(s): "TSH", "T4TOTAL", "T3FREE", "THYROIDAB" in  the last 72 hours.  Invalid input(s): "FREET3" Anemia work up No results for input(s): "VITAMINB12", "FOLATE", "FERRITIN", "TIBC", "IRON", "RETICCTPCT" in the last 72 hours. Urinalysis    Component Value Date/Time   COLORURINE YELLOW 02/09/2023 0500   APPEARANCEUR CLOUDY (A) 02/09/2023 0500   LABSPEC 1.037 (H) 02/09/2023 0500   PHURINE 5.0 02/09/2023 0500   GLUCOSEU >=500 (A) 02/09/2023 0500   HGBUR MODERATE (A) 02/09/2023 0500   BILIRUBINUR NEGATIVE 02/09/2023 0500   KETONESUR 80 (A) 02/09/2023 0500   PROTEINUR 30 (A) 02/09/2023 0500   NITRITE NEGATIVE 02/09/2023 0500   LEUKOCYTESUR MODERATE (A) 02/09/2023 0500   Sepsis Labs Recent Labs  Lab 02/16/23 0425 02/17/23 0407 02/18/23 0412 02/20/23 0412  WBC 50.9* 52.4* 48.1* 55.5*   Microbiology Recent Results (from the past 240 hour(s))  Aerobic/Anaerobic Culture w Gram Stain (surgical/deep wound)     Status: None   Collection Time: 02/12/23  6:25 PM   Specimen: Path fluid; Body Fluid  Result Value Ref Range Status   Specimen Description   Final    FLUID Performed at [facility], [address] [place], [place] [address]    Special Requests   Final    NONE Performed at [facility], [address] [place], [place] [address]    Gram Stain   Final    MODERATE WBC PRESENT, PREDOMINANTLY PMN RARE BUDDING YEAST SEEN RARE GRAM POSITIVE RODS Performed at [facility], [address], [place], [place] [address]    Culture   Final    RARE STREPTOCOCCUS VESTIBULARIS RARE ACTINOMYCES NAESLUNDII RARE VEILLONELLA SPECIES    Report Status 02/20/2023 FINAL  Final   Organism ID, Bacteria STREPTOCOCCUS VESTIBULARIS  Final      Susceptibility   Streptococcus vestibularis - MIC*    PENICILLIN 1 INTERMEDIATE Intermediate     CEFTRIAXONE 1 SENSITIVE Sensitive     ERYTHROMYCIN 2 RESISTANT Resistant     LEVOFLOXACIN 1 SENSITIVE Sensitive     VANCOMYCIN 1 SENSITIVE Sensitive     * RARE STREPTOCOCCUS VESTIBULARIS  MRSA Next  Gen by PCR, Nasal     Status: None   Collection Time: 02/16/23  8:16 AM   Specimen: Nasal Mucosa; Nasal Swab  Result Value Ref Range Status   MRSA by PCR Next Gen NOT DETECTED NOT DETECTED Final    Comment: (NOTE) The GeneXpert MRSA Assay (FDA approved for NASAL specimens only), is one component of a comprehensive MRSA colonization surveillance program. It is not intended to diagnose MRSA infection nor to guide or monitor treatment for MRSA infections. Test performance is not FDA approved in patients less than 60 years old. Performed at [facility], [address], [place], [place] [address]     Time coordinating discharge:   SIGNED:  Standley Dakins, MD  [facility] 02/21/2023, 1:42 PM  How to contact the [facility] Attending or Consulting provider 7A - 7P or covering provider during after hours 7P -7A, for this patient?  Check the care team in [facility] and look for a) attending/consulting [facility] provider listed and b) the [facility] team listed Log into [email or web address] and use [facility] universal password to access. If you do not have the password, please contact the hospital operator. Locate the [facility] provider you are looking for under [facility] and page to a number that you can be directly reached. If you still have difficulty reaching the provider, please page the [facility] (Director on Call) for the Hospitalists listed on amion for assistance.

## 2023-02-21 NOTE — Progress Notes (Signed)
PROGRESS NOTE   Christine Poole  WJX:[id number] DOB: 13-Aug-1950 DOA: 02/08/2023 PCP: Renaye Rakers, MD   Chief Complaint  Patient presents with   Emesis   Weakness   Level of care: Med-Surg  Brief Admission History:  72 y.o. female with medical history significant of arthritis, glaucoma, hypothyroidism, hypertension, hyperlipidemia, and more presents to the ED with a chief complaint of generalized weakness.  Patient reports she started feeling ill about 1 month ago.  The symptoms have waxed and waned.  She reports she is lost about 23 pounds in that 1 month.  Her main complaint is generalized weakness.  She used to be able to ambulate independently.  Recently she has been counter/furniture surfing because she cannot walk without holding onto something.  She reports increased shortness of breath.  The shortness of breath has been constant unlike the other symptoms which have eased off and then come back several times.  She reports she has a cough.  Cough hurts her right shoulder.  She describes it as a sharp pain. Patient complains of early satiety and decreased po intake.  She denies any dysphagia or odynophagia.  She is a former smoker, but quit many years ago. At arrival she had slurred speech that she attributes to her generalized weakness, but code stroke was called.  In ED, pt speech off intermittently, no other significant neuro deficit. Code stroke activated. CT no acute finding. On exam, pt has intermittent stuttering pattern speech, distractable. MR brain was negative.  Neurology, Dr. Jerel Shepherd, saw the patient and felt Pt speech symptoms with stuttering speech more likely functional.   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  There is some compression of the SVC.  There is also an infiltrative mass involving the right inferior  scapula. 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  The patient underwent percutaneous biopsy of her right scapula on 02/11/2023.    Her hospital stay was prolonged secondary to development of abdominal pain on 02/12/2023 for which CT of the abdomen and pelvis revealed free air.  The patient was taken to the operating room for exploratory laparotomy.  She was found to have small bowel perforation.  She underwent small bowel resection and primary anastomosis with Dr. Algis Greenhouse.  Pt developed increasing WBC and serial CT scans reveal rapidly progressive tumor involvement, bilateral PE, distal aorta occlusion and SMV clot; Drs. Lamona Curl, Bridges recommending hospice care.  Goals of care meeting 02/20/23 with Dr. Henreitta Leber, patient, son, sister and patient transitioning to full comfort focused care and will pursue residential hospice placement.    Hospital Course:  Sepsis -present on admission -presented with fever and tachycardia -PCT 1.05 -due to post-obstructive pneumonia -completed IVF and antibiotics -lactic acid 1.5 -10/1 blood cultures negative   Obstructive pneumonia -02/08/23 CTA chest--5x7 cm RUL mass extending into R-hilum with complete obstruction of RUL bronchus and compression of SVC;   mass effect into R-pulm artery suggesting tumor thrombus;  infiltrative mass of R-inferior scapula -treated with IV zosyn -transitioned to full comfort care on 10/12   Small bowel perforation -02/12/2023 CT abdomen noted pneumoperitoneum -02/12/2023--exploratory laparotomy with small bowel resection and primary anastomosis -completed Zosyn>>follow intra-op culture -Postoperative care per Dr. Henreitta Leber -Added fluconazole 10/5 - completed 10/10 -attempted advancing diet,  NG removed 10/10 -- transitioned to full comfort measures on 10/12   Lung masses -CTA chest neg for  PE;  results as discussed above -case discussed with med/onc, Dr. Dollene Primrose IR biopsy of scapula unless IR determines there is an easier target -10/3--IR biopsy of scapular area:  results c/w poor differentiated adenocarcinoma -10/3--discussed with Dr. Tawny Asal will arrange outpt follow next week -10/2 CT abd/pelvis--extensive LN porta hepatis/peripancreatic/retroperitoneal;  R-adrenal mass; bilateral perirenal space nodules -10/12 - transitioned to full comfort measures  Leukocytosis suspect Leukemoid reaction -felt to be reactive due metastatic cancer, obstructive pneumonia -differential without any concerning WBC precursors -remains afebrile and hemodynamically stable -repeat CBC with diff--no concerning WBC precursors -discussed with Dr. Henreitta Leber, repeated CT chest/abd rule out any new process, including abscess, worsening pneumonia, etc. CT neg for new process -asked Dr Ellin Saba to weigh in, says likely due to underlying cancer    Sinus tachycardia -personally reviewed EKG--sinus tach, nonspecific T-wave changes -CTA chest--negative PE -was previously tachycardic 130-140 on cardizem/metoprolol>>overall improving -resumed diltiazem 30 mg BID, if BP tolerates, advanced to home dose of 90 mg BID on 10/11 -transitioned to full comfort measures on 10/12   Hyperlipidemia  Essential HTN   Hypokalemia -repleted po and IV   Hypophophatemia -repleted with K phos   Hypothyroidism following RAI therapy   DM2 with hypoglycemia -allow for liberal glycemic control at this point   Hyponatremia -volume depletion, poor solute intake with 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 -transitioned to full comfort care on 10/12   Severe malnutrition -transitioned to full comfort care on 10/12  02/20/23 Goals of care: repeated CT scan chest/abd/pelvis =02/20/23 due to ongoing weakness in legs, elevated WBC; called by radiologist with worsening findings of advancing tumor burden, bilateral  DVT, distal aorta occlusion, discussed with Dr Ellin Saba, he recommended hospice care, pt not a candidate for chemotherapy at this time; goals of care meeting with Dr Henreitta Leber, son Clint, daughter and discussed with patient and she has full decisional capacity at this time and she was clear that she wanted to be DNR and to have comfort care and residential hospice.   Code Status: DNR  Family Communication: son, sister Disposition:  residential hospice   Consultants:  IR radiology Surgery Henreitta Leber) Ellin Saba (oncology)  Procedures:  Biopsy scapula mass - path c/w poorly differentiated adenocarcinoma  Small bowel pathology c/w adenocarcinoma (could be Meckel's in origin)   Antimicrobials:   Zosyn>>stopped 10/10   Subjective: Resting comfortably      Objective: Vitals:   02/20/23 0620 02/20/23 0726 02/20/23 1403 02/21/23 0546  BP: (!) 154/93 (!) 154/87 (!) 140/90 (!) 160/88  Pulse: (!) 121 (!) 125 (!) 125 (!) 121  Resp: (!) 26 (!) 33 (!) 30 (!) 25  Temp: 97.6 F (36.4 C) 98.2 F (36.8 C) 97.9 F (36.6 C) 97.8 F (36.6 C)  TempSrc: Oral Oral Oral   SpO2: 100% 100% 97% 95%  Weight:      Height:        Intake/Output Summary (Last 24 hours) at 02/21/2023 1215 Last data filed at 02/21/2023 0559 Gross per 24 hour  Intake 65.67 ml  Output 1650 ml  Net -1584.33 ml   Filed Weights   02/08/23 1618 02/08/23 2056  Weight: 83 kg 74.2 kg   Examination:  General exam: Appears thin, chronically ill, NAD, ambulating in room.    Respiratory system: tachypneic, diminished BS RLL. Moderate increased work of breathing.  Cardiovascular system: normal S1 & S2 heard. No JVD, murmurs, rubs, gallops or  clicks. No pedal edema. Gastrointestinal system: Abdomen is nondistended, soft and nontender. No organomegaly or masses felt. Normal bowel sounds heard. Central nervous system: Alert and oriented. No focal neurological deficits. Extremities: profound weakness bilateral lower extremities.   Skin: No rashes, lesions or ulcers. Psychiatry: Judgement and insight appear normal. Mood & affect appropriate.   Data Reviewed: I have personally reviewed following labs and imaging studies  CBC: Recent Labs  Lab 02/15/23 0442 02/16/23 0425 02/17/23 0407 02/18/23 0412 02/20/23 0412  WBC 46.1* 50.9* 52.4* 48.1* 55.5*  NEUTROABS 42.0* 42.2* 47.7* 41.1*  --   HGB 8.1* 8.2* 8.2* 8.1* 7.9*  HCT 26.1* 26.9* 25.8* 24.5* 24.7*  MCV 82.6 82.5 82.2 79.3* 81.3  PLT 352 311 152 92* 38*    Basic Metabolic Panel: Recent Labs  Lab 02/15/23 0442 02/16/23 0425 02/17/23 0407 02/18/23 0412 02/19/23 0414 02/20/23 0412  NA 135 133* 133* 132* 134* 135  K 3.1* 3.4* 3.1* 2.8* 3.2* 3.6  CL 100 98 98 100 102 107  CO2 25 23 20* 22 21* 19*  GLUCOSE 101* 64* 73 128* 123* 178*  BUN 14 8 6* 5* <5* 5*  CREATININE 0.58 0.60 0.62 0.51 0.57 0.58  CALCIUM 8.4* 8.4* 8.2* 8.1* 8.3* 8.3*  MG 2.0 2.2 2.0 2.0  --   --   PHOS 2.4*  --  2.7  --   --   --     CBG: Recent Labs  Lab 02/16/23 0627 02/16/23 2033 02/20/23 0358  GLUCAP 77 100* 166*    Recent Results (from the past 240 hour(s))  Aerobic/Anaerobic Culture w Gram Stain (surgical/deep wound)     Status: None   Collection Time: 02/12/23  6:25 PM   Specimen: Path fluid; Body Fluid  Result Value Ref Range Status   Specimen Description   Final    FLUID Performed at [facility], [address] [place], [place] [address]    Special Requests   Final    NONE Performed at [facility], [address] [place], [place] [address]    Gram Stain   Final    MODERATE WBC PRESENT, PREDOMINANTLY PMN RARE BUDDING YEAST SEEN RARE GRAM POSITIVE RODS Performed at [facility], [address], [place], [place] [address]    Culture   Final    RARE STREPTOCOCCUS VESTIBULARIS RARE ACTINOMYCES NAESLUNDII RARE VEILLONELLA SPECIES    Report Status 02/20/2023 FINAL  Final   Organism ID, Bacteria STREPTOCOCCUS VESTIBULARIS  Final       Susceptibility   Streptococcus vestibularis - MIC*    PENICILLIN 1 INTERMEDIATE Intermediate     CEFTRIAXONE 1 SENSITIVE Sensitive     ERYTHROMYCIN 2 RESISTANT Resistant     LEVOFLOXACIN 1 SENSITIVE Sensitive     VANCOMYCIN 1 SENSITIVE Sensitive     * RARE STREPTOCOCCUS VESTIBULARIS  MRSA Next Gen by PCR, Nasal     Status: None   Collection Time: 02/16/23  8:16 AM   Specimen: Nasal Mucosa; Nasal Swab  Result Value Ref Range Status   MRSA by PCR Next Gen NOT DETECTED NOT DETECTED Final    Comment: (NOTE) The GeneXpert MRSA Assay (FDA approved for NASAL specimens only), is one component of a comprehensive MRSA colonization surveillance program. It is not intended to diagnose MRSA infection nor to guide or monitor treatment for MRSA infections. Test performance is not FDA approved in patients less than 52 years old. Performed at [facility], [address] [place], [place] [address]  Radiology Studies: CT CHEST ABDOMEN PELVIS W CONTRAST  Addendum Date: 02/20/2023   ADDENDUM REPORT: 02/20/2023 14:21 ADDENDUM: Critical Value/emergent results were called by telephone at the time of interpretation on 02/20/2023 at 2:20 pm to provider [facility] , who verbally acknowledged these results. Electronically Signed   By: Signa Kell M.D.   On: 02/20/2023 14:21   Result Date: 02/20/2023 CLINICAL DATA:  Metastatic disease evaluation. New finding of sepsis. History of lung cancer. * Tracking Code: BO * EXAM: CT CHEST, ABDOMEN, AND PELVIS WITH CONTRAST TECHNIQUE: Multidetector CT imaging of the chest, abdomen and pelvis was performed following the standard protocol during bolus administration of intravenous contrast. RADIATION DOSE REDUCTION: This exam was performed according to the departmental dose-optimization program which includes automated exposure control, adjustment of the mA and/or kV according to patient size and/or use of iterative reconstruction technique. CONTRAST:   OMNIPAQUE IOHEXOL 300 MG/ML  SOLN COMPARISON:  02/17/2023 FINDINGS: CT CHEST FINDINGS Cardiovascular: Normal heart size. Aortic atherosclerosis. Coronary artery calcifications. Small pericardial effusion, unchanged. Tumor extension into the superior vena cava of rum right paratracheal adenopathy is again noted, image 21/2 and coronal image 81/5. Tumor encases the distal right main pulmonary artery with complete occlusion and likely intraluminal extension of the right upper lobe pulmonary artery. There is also complete occlusion of the right middle lobe pulmonary artery. Mass effect upon the proximal right lower lobe pulmonary artery is identified. Although suboptimal for the evaluation of pulmonary emboli there are suspicious filling defects within the distal right lower lobar pulmonary artery and proximal left lower lobar pulmonary arteries which are concerning for acute pulmonary embolus. Similarly, there is a focal filling defect within a lingular branch of the left pulmonary artery, filling defect within the right lower lobe pulmonary artery is noted, image 100/5. There is also a filling defect within a segmental branch of the left upper lobe pulmonary artery. Findings are concerning for superimposed pulmonary emboli. Mediastinum/Nodes: Bulky mediastinal and right hilar adenopathy is again seen. Index right paratracheal lymph node with mass effect upon the carina measures 5.1 x 5.5 cm, image 22/2. Formally this measured the same. Left pre vascular lymph node measures 4.3 x 3.1 cm, image 19/2. Previously 4.2 x 2.9 cm. Right hilar mass measures 6.7 cm in AP dimension. This is compared with 6.3 cm previously. Postobstructive pneumonitis within the right upper lobe is again seen and is not significantly improved from previous exam. Lungs/Pleura: Small bilateral pleural effusions are again noted, right greater than left. These are stable to slightly increased in the interval. Right upper lobe tumor with possible  lymphangitic spread is again noted anteriorly, this measures 8.7 x 5.3 cm, image 26/2. Formally 8.6 by 5.6 cm. Pleural nodule overlying the lateral right mid lung measures 3.3 x 2.1 cm, image 26/2. Previously 3.0 by 2.0 cm. Patchy ground-glass attenuation noted within the left lung. Musculoskeletal: There is a mass encasing the body of sternum with underlying destructive bone changes measuring 6.6 x 7.0 cm, image 21/2. Formally this measured the same. CT ABDOMEN PELVIS FINDINGS Hepatobiliary: No focal liver abnormality. Mild central biliary prominence. Gallbladder appears normal. Mild increase caliber of the common bile duct measures 1 cm, similar to previous exam. Pancreas: Unremarkable. No pancreatic ductal dilatation or surrounding inflammatory changes. Spleen: Normal in size without focal abnormality. Adrenals/Urinary Tract: Normal left adrenal gland. Right adrenal mass measures 5.2 x 4.1 cm, unchanged from previous exam. Small amount of macroscopic fat and calcification noted within the posterior aspect of this lesion.  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 Mild bilateral perinephric fat stranding. Small right scratch set small bilateral kidney cysts noted. No follow-up imaging recommended. No signs of hydronephrosis. Urinary bladder is normal. Stomach/Bowel: The stomach appears within normal limits. Similar appearance of mildly increased caliber small bowel loops within the left hemiabdomen and central left lower abdomen proximal to small bowel resection site, image 94/2. Here, the small bowel loops measure up to 3.6 cm proximal to the suture line. The remaining small bowel loops are normal in caliber. Enteric contrast material is noted within the colon up to the level of the rectum. No bowel wall thickening or inflammatory change identified. Vascular/Lymphatic: Aortic atherosclerosis. There is thrombus identified within a branch of the  superior mesenteric vein, image 72/5. This has increased in size when compared with study from 12/18/2022. 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 There is patency of bilateral external and internal iliac arteries as well as the femoral or arteries. Reproductive: Status post hysterectomy. No adnexal masses. Other: Peritoneal and retroperitoneal nodularity is identified bilaterally compatible with metastatic disease. Within the left posterior retroperitoneum there is a soft tissue nodule measuring 1.8 cm, image 80/2. Previously 1.6 cm. Right lower quadrant peritoneal nodule measures 0.8 cm, image 88/2. Previously 0.7 cm. Small volume of free fluid noted within the posterior pelvis. Possible serosal metastasis to the lower rectum with increase mural soft tissue, image 104/2. No signs of pneumoperitoneum. Open ventral abdominal wound identified. Musculoskeletal: No acute abnormality. Degenerative disc disease within the lower lumbar spine. IMPRESSION: 1. 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 There is patency of bilateral external and internal iliac arteries as well as the femoral or arteries. 2. There is thrombus identified within a branch of the superior mesenteric vein. This has increased in size when compared with study from 12/18/2022. 3. Tumor extension into the superior vena cava of from right paratracheal adenopathy is again noted. Tumor encases the distal right main pulmonary artery with complete occlusion and likely intraluminal extension of the proximal right upper lobe pulmonary artery. There is also complete occlusion of the right middle lobe pulmonary artery. Mass effect upon the proximal right lower lobe pulmonary artery is identified. 4. Although suboptimal for the evaluation of pulmonary emboli there are suspicious  filling defects within the distal right lower lobar pulmonary artery and proximal left lower lobar pulmonary arteries which are concerning for acute pulmonary embolus. Similarly, there is a focal filling defect within a branch of the left pulmonary artery. Findings are concerning for pulmonary emboli. 5. Right upper lobe tumor with possible lymphangitic spread is again noted and is not significantly changed from previous exam. 6. Similar appearance of bulky mediastinal and right hilar adenopathy. 7. Small bilateral pleural effusions, right greater than left. These are stable to slightly increased in the interval. 8. Right adrenal mass measures 5.2 x 4.1 cm, unchanged from previous exam. 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 9. Similar appearance of mildly increased caliber small bowel loops within the left hemiabdomen and central left lower abdomen proximal to small bowel resection site. 10. Possible serosal metastasis to the lower rectum with increase mural soft tissue. 11. Similar size of right scapular metastasis with underlying destructive changes of the scapular body. 12.  Aortic Atherosclerosis (ICD10-I70.0). Electronically  Signed: By: Signa Kell M.D. On: 02/20/2023 14:15   CT HEAD WO CONTRAST ( )  Result Date: 02/20/2023 CLINICAL DATA:  72 year old female with lower extremity numbness and burning. Neurologic deficit. Metastatic lung cancer. EXAM: CT HEAD WITHOUT CONTRAST TECHNIQUE: Contiguous axial images were obtained from the base of the skull through the vertex without intravenous contrast. RADIATION DOSE REDUCTION: This exam was performed according to the departmental dose-optimization program which includes automated exposure control, adjustment of the mA and/or kV according to patient size and/or use of iterative reconstruction technique. COMPARISON:  Head CT 02/08/2023. FINDINGS: Brain: Cerebral volume is within normal limits  for age. No midline shift, ventriculomegaly, mass effect, evidence of mass lesion, intracranial hemorrhage or evidence of cortically based acute infarction. Gray-white matter differentiation is stable and within normal limits for age. Tiny chronic left cerebellar infarct better demonstrated by MRI last month. Vascular: Calcified atherosclerosis at the skull base. No suspicious intracranial vascular hyperdensity. Skull: No acute or suspicious osseous lesion identified. Sinuses/Orbits: Visualized paranasal sinuses and mastoids are clear. Other: No acute orbit or scalp soft tissue finding. IMPRESSION: No acute intracranial abnormality. Stable and negative for age noncontrast CT appearance of the brain. Electronically Signed   By: Odessa Fleming M.D.   On: 02/20/2023 13:38   US Venous Img Lower Bilateral (DVT)  Result Date: 02/20/2023 CLINICAL DATA:  Bilateral lower extremity edema EXAM: BILATERAL LOWER EXTREMITY VENOUS DOPPLER ULTRASOUND TECHNIQUE: Gray-scale sonography with graded compression, as well as color Doppler and duplex ultrasound were performed to evaluate the lower extremity deep venous systems from the level of the common femoral vein and including the common femoral, femoral, profunda femoral, popliteal and calf veins including the posterior tibial, peroneal and gastrocnemius veins when visible. The superficial great saphenous vein was also interrogated. Spectral Doppler was utilized to evaluate flow at rest and with distal augmentation maneuvers in the common femoral, femoral and popliteal veins. COMPARISON:  None Available. FINDINGS: RIGHT LOWER EXTREMITY Common Femoral Vein: No evidence of thrombus. Normal compressibility, respiratory phasicity and response to augmentation. Saphenofemoral Junction: No evidence of thrombus. Normal compressibility and flow on color Doppler imaging. Profunda Femoral Vein: No evidence of thrombus. Normal compressibility and flow on color Doppler imaging. Femoral Vein: No  evidence of thrombus. Normal compressibility, respiratory phasicity and response to augmentation. Popliteal Vein: No evidence of thrombus. Normal compressibility, respiratory phasicity and response to augmentation. Calf Veins: No evidence of thrombus. Normal compressibility and flow on color Doppler imaging. Superficial Great Saphenous Vein: No evidence of thrombus. Normal compressibility. Venous Reflux:  None. Other Findings:  None. LEFT LOWER EXTREMITY Common Femoral Vein: No evidence of thrombus. Normal compressibility, respiratory phasicity and response to augmentation. Saphenofemoral Junction: No evidence of thrombus. Normal compressibility and flow on color Doppler imaging. Profunda Femoral Vein: No evidence of thrombus. Normal compressibility and flow on color Doppler imaging. Femoral Vein: No evidence of thrombus. Normal compressibility, respiratory phasicity and response to augmentation. Popliteal Vein: No evidence of thrombus. Normal compressibility, respiratory phasicity and response to augmentation. Calf Veins: No evidence of thrombus. Normal compressibility and flow on color Doppler imaging. Superficial Great Saphenous Vein: No evidence of thrombus. Normal compressibility. Venous Reflux:  None. Other Findings:  None. IMPRESSION: No evidence of deep venous thrombosis in either lower extremity. Electronically Signed   By: Malachy Moan M.D.   On: 02/20/2023 10:44   DG CHEST PORT 1 VIEW  Result Date: 02/20/2023 CLINICAL DATA:  72 year old female with history of tachypnea. EXAM: PORTABLE CHEST 1 VIEW COMPARISON:  Chest  x-ray 02/12/2023. FINDINGS: Previously noted nasogastric tube has been removed. Persistent right upper lobe airspace consolidation. Left lung is clear. No definite pleural effusions. No pneumothorax. No evidence of pulmonary edema. Heart size is normal. Upper mediastinal contours are grossly distorted. IMPRESSION: 1. Persistent right upper lobe pneumonia related to obstructing right  hilar mass, with mediastinal lymphadenopathy, similar to prior study. Electronically Signed   By: Trudie Reed M.D.   On: 02/20/2023 05:56    Scheduled Meds:  feeding supplement  237 mL Oral TID BM   Continuous Infusions:   LOS: 13 days   Time spent: 30 mins  Nikeria Kalman Laural Benes, MD How to contact the [facility] Attending or Consulting provider 7A - 7P or covering provider during after hours 7P -7A, for this patient?  Check the care team in [facility] and look for a) attending/consulting [facility] provider listed and b) the [facility] team listed Log into [email or web address] to find provider on call.  Locate the [facility] provider you are looking for under [facility] and page to a number that you can be directly reached. If you still have difficulty reaching the provider, please page the [facility] (Director on Call) for the Hospitalists listed on amion for assistance.  02/21/2023, 12:15 PM

## 2023-02-21 NOTE — Progress Notes (Signed)
Physical Therapy Discharge Patient Details Name: Christine Poole MRN: [id number] DOB: 31-Jan-1951 Today's Date: 02/21/2023 Time:  -     Patient discharged from PT services secondary to  transition to comfort care.  Nelida Meuse PT, DPT Physical Therapist with [facility] 02/21/2023, 8:09 AM

## 2023-02-22 ENCOUNTER — Encounter (HOSPITAL_COMMUNITY): Payer: Self-pay | Admitting: General Surgery

## 2023-02-22 ENCOUNTER — Inpatient Hospital Stay: Payer: BC Managed Care – PPO | Admitting: Hematology

## 2023-02-24 LAB — CBC WITH DIFFERENTIAL/PLATELET
Abs Immature Granulocytes: 0 10*3/uL (ref 0.00–0.07)
Band Neutrophils: 4 %
Basophils Absolute: 0 10*3/uL (ref 0.0–0.1)
Basophils Relative: 0 %
Eosinophils Absolute: 0.5 10*3/uL (ref 0.0–0.5)
Eosinophils Relative: 1 %
HCT: 26.9 % — ABNORMAL LOW (ref 36.0–46.0)
Hemoglobin: 8.2 g/dL — ABNORMAL LOW (ref 12.0–15.0)
Lymphocytes Relative: 9 %
Lymphs Abs: 4.6 10*3/uL — ABNORMAL HIGH (ref 0.7–4.0)
MCH: 25.2 pg — ABNORMAL LOW (ref 26.0–34.0)
MCHC: 30.5 g/dL (ref 30.0–36.0)
MCV: 82.5 fL (ref 80.0–100.0)
Monocytes Absolute: 3.6 10*3/uL — ABNORMAL HIGH (ref 0.1–1.0)
Monocytes Relative: 7 %
Neutro Abs: 42.2 10*3/uL — ABNORMAL HIGH (ref 1.7–7.7)
Neutrophils Relative %: 79 %
Platelets: 311 10*3/uL (ref 150–400)
RBC: 3.26 MIL/uL — ABNORMAL LOW (ref 3.87–5.11)
RDW: 17.3 % — ABNORMAL HIGH (ref 11.5–15.5)
WBC: 50.9 10*3/uL (ref 4.0–10.5)
nRBC: 0.4 % — ABNORMAL HIGH (ref 0.0–0.2)
nRBC: 2 /100{WBCs} — ABNORMAL HIGH

## 2023-03-07 NOTE — Progress Notes (Unsigned)
New Patient Office Visit   Subjective   Patient ID: Christine Poole, female    DOB: 1950-09-22  Age: 72 y.o. MRN: [id number]  CC: No chief complaint on file.   HPI Christine Poole  72 year old female, presents to establish care. She  has a past medical history of Arthritis, Glaucoma, Hyperplastic polyp of large intestine, and Hypertension.  HPI    No outpatient encounter medications on file as of 03/08/2023.   No facility-administered encounter medications on file as of 03/08/2023.    Past Surgical History:  Procedure Laterality Date   ABDOMINAL HYSTERECTOMY     BOWEL RESECTION N/A 02/12/2023   Procedure: SMALL BOWEL RESECTION;  Surgeon: Lucretia Roers, MD;  Location: [facility];  Service: General;  Laterality: N/A;   COLONOSCOPY N/A 08/08/2012   [id number] polyps and internal hemorrhoids.Status post removal rectal polyps   COLONOSCOPY N/A 04/16/2014   Procedure: COLONOSCOPY;  Surgeon: Corbin Ade, MD;  Location: [facility];  Service: Endoscopy;  Laterality: N/A;  1:30pm   KNEE ARTHROSCOPY Left    LAPAROTOMY N/A 02/12/2023   Procedure: EXPLORATION LAPAROTOMY;  Surgeon: Lucretia Roers, MD;  Location: [facility];  Service: General;  Laterality: N/A;    ROS    Objective    There were no vitals taken for this visit.  Physical Exam    Assessment & Plan:  There are no diagnoses linked to this encounter.  No follow-ups on file.   [name], FNP

## 2023-03-07 NOTE — Patient Instructions (Signed)

## 2023-03-08 ENCOUNTER — Ambulatory Visit: Payer: BC Managed Care – PPO | Admitting: Family Medicine

## 2023-03-08 DIAGNOSIS — R7301 Impaired fasting glucose: Secondary | ICD-10-CM

## 2023-03-08 DIAGNOSIS — M810 Age-related osteoporosis without current pathological fracture: Secondary | ICD-10-CM

## 2023-03-08 DIAGNOSIS — Z1159 Encounter for screening for other viral diseases: Secondary | ICD-10-CM

## 2023-03-08 DIAGNOSIS — E559 Vitamin D deficiency, unspecified: Secondary | ICD-10-CM

## 2023-03-08 DIAGNOSIS — Z136 Encounter for screening for cardiovascular disorders: Secondary | ICD-10-CM

## 2023-03-08 DIAGNOSIS — E538 Deficiency of other specified B group vitamins: Secondary | ICD-10-CM

## 2023-03-08 LAB — SURGICAL PATHOLOGY

## 2023-03-12 DEATH — deceased

## 2023-04-19 ENCOUNTER — Ambulatory Visit: Payer: BC Managed Care – PPO | Admitting: Nurse Practitioner

## 2023-06-14 LAB — MOLECULAR PATHOLOGY

## 2024-03-15 ENCOUNTER — Encounter (INDEPENDENT_AMBULATORY_CARE_PROVIDER_SITE_OTHER): Payer: Self-pay | Admitting: *Deleted
